# Patient Record
Sex: Male | Born: 1965 | Race: White | Hispanic: No | Marital: Single | Smoking: Current every day smoker
Health system: Southern US, Community
[De-identification: ages and names within clinical notes are randomized; demographics above are authoritative.]

## PROBLEM LIST (undated history)

## (undated) DIAGNOSIS — I509 Heart failure, unspecified: Secondary | ICD-10-CM

## (undated) DIAGNOSIS — Z972 Presence of dental prosthetic device (complete) (partial): Secondary | ICD-10-CM

## (undated) DIAGNOSIS — E7800 Pure hypercholesterolemia, unspecified: Secondary | ICD-10-CM

## (undated) DIAGNOSIS — J449 Chronic obstructive pulmonary disease, unspecified: Secondary | ICD-10-CM

## (undated) DIAGNOSIS — K812 Acute cholecystitis with chronic cholecystitis: Secondary | ICD-10-CM

## (undated) DIAGNOSIS — K429 Umbilical hernia without obstruction or gangrene: Secondary | ICD-10-CM

## (undated) DIAGNOSIS — G709 Myoneural disorder, unspecified: Secondary | ICD-10-CM

## (undated) DIAGNOSIS — F32A Depression, unspecified: Secondary | ICD-10-CM

## (undated) DIAGNOSIS — F419 Anxiety disorder, unspecified: Secondary | ICD-10-CM

## (undated) DIAGNOSIS — I10 Essential (primary) hypertension: Secondary | ICD-10-CM

## (undated) DIAGNOSIS — R0600 Dyspnea, unspecified: Secondary | ICD-10-CM

## (undated) DIAGNOSIS — I499 Cardiac arrhythmia, unspecified: Secondary | ICD-10-CM

## (undated) DIAGNOSIS — F1010 Alcohol abuse, uncomplicated: Secondary | ICD-10-CM

## (undated) DIAGNOSIS — R569 Unspecified convulsions: Secondary | ICD-10-CM

## (undated) DIAGNOSIS — F329 Major depressive disorder, single episode, unspecified: Secondary | ICD-10-CM

## (undated) DIAGNOSIS — I639 Cerebral infarction, unspecified: Secondary | ICD-10-CM

## (undated) HISTORY — DX: Acute cholecystitis with chronic cholecystitis: K81.2

## (undated) HISTORY — DX: Umbilical hernia without obstruction or gangrene: K42.9

---
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
## 2009-07-25 ENCOUNTER — Inpatient Hospital Stay: Payer: Self-pay | Admitting: Internal Medicine

## 2009-08-25 ENCOUNTER — Inpatient Hospital Stay: Payer: Self-pay | Admitting: Internal Medicine

## 2010-05-25 ENCOUNTER — Emergency Department: Payer: Self-pay | Admitting: Emergency Medicine

## 2012-06-14 ENCOUNTER — Inpatient Hospital Stay: Payer: Self-pay | Admitting: Internal Medicine

## 2012-06-14 LAB — COMPREHENSIVE METABOLIC PANEL
Albumin: 3.6 g/dL (ref 3.4–5.0)
Anion Gap: 10 (ref 7–16)
BUN: 7 mg/dL (ref 7–18)
Bilirubin,Total: 0.3 mg/dL (ref 0.2–1.0)
Chloride: 105 mmol/L (ref 98–107)
Co2: 23 mmol/L (ref 21–32)
Creatinine: 0.83 mg/dL (ref 0.60–1.30)
EGFR (African American): >60
Osmolality: 276 (ref 275–301)
Potassium: 3.6 mmol/L (ref 3.5–5.1)
Sodium: 138 mmol/L (ref 136–145)
Total Protein: 7.3 g/dL (ref 6.4–8.2)

## 2012-06-14 LAB — CK TOTAL AND CKMB (NOT AT ARMC)
CK, Total: 200 U/L (ref 35–232)
CK-MB: 3.3 ng/mL (ref 0.5–3.6)

## 2012-06-14 LAB — CBC
HGB: 15.6 g/dL (ref 13.0–18.0)
MCH: 33.2 pg (ref 26.0–34.0)
MCV: 96 fL (ref 80–100)
WBC: 7.4 10*3/uL (ref 3.8–10.6)

## 2012-06-14 LAB — TROPONIN I: Troponin-I: <0.02 ng/mL

## 2012-06-15 LAB — CBC WITH DIFFERENTIAL/PLATELET
Basophil #: 0 10*3/uL (ref 0.0–0.1)
Basophil %: 0.3 %
Eosinophil #: 0 10*3/uL (ref 0.0–0.7)
Eosinophil %: 0.2 %
HCT: 46.3 % (ref 40.0–52.0)
HGB: 16 g/dL (ref 13.0–18.0)
Lymphocyte #: 0.4 10*3/uL — ABNORMAL LOW (ref 1.0–3.6)
MCH: 33.4 pg (ref 26.0–34.0)
MCHC: 34.6 g/dL (ref 32.0–36.0)
MCV: 96 fL (ref 80–100)
Monocyte #: 0.1 x10 3/mm — ABNORMAL LOW (ref 0.2–1.0)
Neutrophil #: 4.5 10*3/uL (ref 1.4–6.5)
Platelet: 171 10*3/uL (ref 150–440)
RBC: 4.8 10*6/uL (ref 4.40–5.90)
WBC: 5.1 10*3/uL (ref 3.8–10.6)

## 2012-06-15 LAB — BASIC METABOLIC PANEL
Calcium, Total: 8.7 mg/dL (ref 8.5–10.1)
Co2: 27 mmol/L (ref 21–32)
EGFR (African American): >60
EGFR (Non-African Amer.): >60
Osmolality: 276 (ref 275–301)
Potassium: 3.8 mmol/L (ref 3.5–5.1)
Sodium: 137 mmol/L (ref 136–145)

## 2012-06-15 LAB — HEMOGLOBIN A1C: Hemoglobin A1C: 5.1 % (ref 4.2–6.3)

## 2012-06-20 ENCOUNTER — Emergency Department: Payer: Self-pay | Admitting: Emergency Medicine

## 2012-06-20 LAB — BASIC METABOLIC PANEL
BUN: 9 mg/dL (ref 7–18)
Co2: 26 mmol/L (ref 21–32)
Creatinine: 0.88 mg/dL (ref 0.60–1.30)
EGFR (Non-African Amer.): >60
Glucose: 111 mg/dL — ABNORMAL HIGH (ref 65–99)
Potassium: 3.6 mmol/L (ref 3.5–5.1)
Sodium: 138 mmol/L (ref 136–145)

## 2012-06-20 LAB — CBC
MCH: 33.7 pg (ref 26.0–34.0)
MCV: 95 fL (ref 80–100)
Platelet: 239 10*3/uL (ref 150–440)
RBC: 4.38 10*6/uL — ABNORMAL LOW (ref 4.40–5.90)

## 2013-03-26 LAB — CBC
HCT: 47.1 % (ref 40.0–52.0)
HGB: 16.5 g/dL (ref 13.0–18.0)
MCV: 98 fL (ref 80–100)
Platelet: 103 10*3/uL — ABNORMAL LOW (ref 150–440)
WBC: 5.8 10*3/uL (ref 3.8–10.6)

## 2013-03-26 LAB — BASIC METABOLIC PANEL
Anion Gap: 9 (ref 7–16)
BUN: 5 mg/dL — ABNORMAL LOW (ref 7–18)
Calcium, Total: 8.8 mg/dL (ref 8.5–10.1)
Chloride: 107 mmol/L (ref 98–107)
Co2: 24 mmol/L (ref 21–32)
Creatinine: 0.67 mg/dL (ref 0.60–1.30)
EGFR (Non-African Amer.): >60
Potassium: 3.5 mmol/L (ref 3.5–5.1)
Sodium: 140 mmol/L (ref 136–145)

## 2013-03-26 LAB — TROPONIN I: Troponin-I: <0.02 ng/mL

## 2013-03-27 ENCOUNTER — Inpatient Hospital Stay: Payer: Self-pay | Admitting: Internal Medicine

## 2013-03-27 LAB — DRUG SCREEN, URINE
Amphetamines, Ur Screen: NEGATIVE (ref ?–1000)
Barbiturates, Ur Screen: NEGATIVE (ref ?–200)
Cocaine Metabolite,Ur ~~LOC~~: NEGATIVE (ref ?–300)
Methadone, Ur Screen: NEGATIVE (ref ?–300)
Opiate, Ur Screen: NEGATIVE (ref ?–300)
Phencyclidine (PCP) Ur S: NEGATIVE (ref ?–25)
Tricyclic, Ur Screen: NEGATIVE (ref ?–1000)

## 2013-03-27 LAB — ETHANOL
Ethanol %: 0.401 % (ref 0.000–0.080)
Ethanol: 401 mg/dL

## 2013-05-24 ENCOUNTER — Ambulatory Visit: Payer: Self-pay | Admitting: Gastroenterology

## 2013-06-21 ENCOUNTER — Inpatient Hospital Stay: Payer: Self-pay | Admitting: Internal Medicine

## 2013-06-21 LAB — BASIC METABOLIC PANEL
Anion Gap: 6 — ABNORMAL LOW (ref 7–16)
BUN: 5 mg/dL — ABNORMAL LOW (ref 7–18)
Calcium, Total: 9.4 mg/dL (ref 8.5–10.1)
Co2: 30 mmol/L (ref 21–32)
EGFR (African American): >60
EGFR (Non-African Amer.): >60
Potassium: 3.6 mmol/L (ref 3.5–5.1)
Sodium: 139 mmol/L (ref 136–145)

## 2013-06-21 LAB — CBC
HCT: 47.6 % (ref 40.0–52.0)
HGB: 16.4 g/dL (ref 13.0–18.0)
MCH: 33.4 pg (ref 26.0–34.0)
MCHC: 34.4 g/dL (ref 32.0–36.0)
RBC: 4.9 10*6/uL (ref 4.40–5.90)

## 2013-06-22 LAB — CBC WITH DIFFERENTIAL/PLATELET
Basophil #: 0.1 10*3/uL (ref 0.0–0.1)
Basophil %: 0.3 %
Eosinophil #: 0 10*3/uL (ref 0.0–0.7)
Eosinophil %: 0 %
HCT: 47.7 % (ref 40.0–52.0)
HGB: 16.4 g/dL (ref 13.0–18.0)
Lymphocyte #: 0.7 10*3/uL — ABNORMAL LOW (ref 1.0–3.6)
Lymphocyte %: 4.3 %
MCV: 97 fL (ref 80–100)
Monocyte #: 0.3 x10 3/mm (ref 0.2–1.0)
Monocyte %: 2 %
Neutrophil %: 93.4 %
Platelet: 192 10*3/uL (ref 150–440)
RBC: 4.94 10*6/uL (ref 4.40–5.90)
RDW: 12.2 % (ref 11.5–14.5)
WBC: 15.9 10*3/uL — ABNORMAL HIGH (ref 3.8–10.6)

## 2013-06-22 LAB — BASIC METABOLIC PANEL
Anion Gap: 7 (ref 7–16)
BUN: 9 mg/dL (ref 7–18)
Calcium, Total: 9.6 mg/dL (ref 8.5–10.1)
Co2: 25 mmol/L (ref 21–32)
Creatinine: 0.73 mg/dL (ref 0.60–1.30)
EGFR (Non-African Amer.): >60
Glucose: 131 mg/dL — ABNORMAL HIGH (ref 65–99)
Osmolality: 272 (ref 275–301)
Potassium: 3.6 mmol/L (ref 3.5–5.1)

## 2013-10-03 ENCOUNTER — Inpatient Hospital Stay: Payer: Self-pay | Admitting: Internal Medicine

## 2013-10-03 LAB — BASIC METABOLIC PANEL
ANION GAP: 8 (ref 7–16)
BUN: 4 mg/dL — AB (ref 7–18)
CALCIUM: 8.7 mg/dL (ref 8.5–10.1)
Chloride: 103 mmol/L (ref 98–107)
Co2: 27 mmol/L (ref 21–32)
Creatinine: 0.76 mg/dL (ref 0.60–1.30)
EGFR (African American): >60
EGFR (Non-African Amer.): >60
Glucose: 114 mg/dL — ABNORMAL HIGH (ref 65–99)
Osmolality: 273 (ref 275–301)
POTASSIUM: 3.5 mmol/L (ref 3.5–5.1)
SODIUM: 138 mmol/L (ref 136–145)

## 2013-10-03 LAB — CBC
HCT: 47.9 % (ref 40.0–52.0)
HGB: 16.8 g/dL (ref 13.0–18.0)
MCH: 33.9 pg (ref 26.0–34.0)
MCHC: 34.9 g/dL (ref 32.0–36.0)
MCV: 97 fL (ref 80–100)
Platelet: 154 10*3/uL (ref 150–440)
RBC: 4.94 10*6/uL (ref 4.40–5.90)
RDW: 12.6 % (ref 11.5–14.5)
WBC: 4.3 10*3/uL (ref 3.8–10.6)

## 2013-10-03 LAB — TROPONIN I: Troponin-I: <0.02 ng/mL

## 2013-11-21 LAB — BASIC METABOLIC PANEL
Anion Gap: 7 (ref 7–16)
BUN: 7 mg/dL (ref 7–18)
CALCIUM: 9.5 mg/dL (ref 8.5–10.1)
CHLORIDE: 103 mmol/L (ref 98–107)
Co2: 26 mmol/L (ref 21–32)
Creatinine: 0.78 mg/dL (ref 0.60–1.30)
EGFR (African American): >60
GLUCOSE: 98 mg/dL (ref 65–99)
OSMOLALITY: 270 (ref 275–301)
Potassium: 3.8 mmol/L (ref 3.5–5.1)
Sodium: 136 mmol/L (ref 136–145)

## 2013-11-21 LAB — CBC
HCT: 48.2 % (ref 40.0–52.0)
HGB: 16.6 g/dL (ref 13.0–18.0)
MCH: 33.5 pg (ref 26.0–34.0)
MCHC: 34.6 g/dL (ref 32.0–36.0)
MCV: 97 fL (ref 80–100)
Platelet: 130 10*3/uL — ABNORMAL LOW (ref 150–440)
RBC: 4.96 10*6/uL (ref 4.40–5.90)
RDW: 12.7 % (ref 11.5–14.5)
WBC: 9 10*3/uL (ref 3.8–10.6)

## 2013-11-21 LAB — PRO B NATRIURETIC PEPTIDE: B-Type Natriuretic Peptide: 27 pg/mL (ref 0–125)

## 2013-11-21 LAB — TROPONIN I: Troponin-I: <0.02 ng/mL

## 2013-11-22 ENCOUNTER — Inpatient Hospital Stay: Payer: Self-pay | Admitting: Internal Medicine

## 2013-11-23 LAB — CBC WITH DIFFERENTIAL/PLATELET
Basophil #: 0 10*3/uL (ref 0.0–0.1)
Basophil %: 0.3 %
EOS ABS: 0 10*3/uL (ref 0.0–0.7)
EOS PCT: 0 %
HCT: 45.7 % (ref 40.0–52.0)
HGB: 15.7 g/dL (ref 13.0–18.0)
LYMPHS ABS: 0.4 10*3/uL — AB (ref 1.0–3.6)
LYMPHS PCT: 3.6 %
MCH: 33.4 pg (ref 26.0–34.0)
MCHC: 34.3 g/dL (ref 32.0–36.0)
MCV: 97 fL (ref 80–100)
MONO ABS: 0.3 x10 3/mm (ref 0.2–1.0)
Monocyte %: 2.7 %
Neutrophil #: 10.5 10*3/uL — ABNORMAL HIGH (ref 1.4–6.5)
Neutrophil %: 93.4 %
Platelet: 136 10*3/uL — ABNORMAL LOW (ref 150–440)
RBC: 4.71 10*6/uL (ref 4.40–5.90)
RDW: 12.9 % (ref 11.5–14.5)
WBC: 11.3 10*3/uL — AB (ref 3.8–10.6)

## 2013-11-23 LAB — BASIC METABOLIC PANEL
Anion Gap: 7 (ref 7–16)
BUN: 10 mg/dL (ref 7–18)
CO2: 25 mmol/L (ref 21–32)
Calcium, Total: 9.1 mg/dL (ref 8.5–10.1)
Chloride: 102 mmol/L (ref 98–107)
Creatinine: 0.79 mg/dL (ref 0.60–1.30)
EGFR (Non-African Amer.): >60
Glucose: 168 mg/dL — ABNORMAL HIGH (ref 65–99)
OSMOLALITY: 271 (ref 275–301)
Potassium: 3.7 mmol/L (ref 3.5–5.1)
Sodium: 134 mmol/L — ABNORMAL LOW (ref 136–145)

## 2013-11-23 LAB — MAGNESIUM: Magnesium: 2.1 mg/dL

## 2014-04-24 ENCOUNTER — Inpatient Hospital Stay: Payer: Self-pay | Admitting: Internal Medicine

## 2014-04-24 LAB — URINALYSIS, COMPLETE
Bacteria: NONE SEEN
Bilirubin,UR: NEGATIVE
Blood: NEGATIVE
Glucose,UR: NEGATIVE mg/dL (ref 0–75)
Leukocyte Esterase: NEGATIVE
Nitrite: NEGATIVE
Ph: 6 (ref 4.5–8.0)
Protein: 100
RBC,UR: 1 /HPF (ref 0–5)
SPECIFIC GRAVITY: 1.018 (ref 1.003–1.030)
Squamous Epithelial: <1
WBC UR: 4 /HPF (ref 0–5)

## 2014-04-24 LAB — DRUG SCREEN, URINE
Amphetamines, Ur Screen: NEGATIVE (ref ?–1000)
Barbiturates, Ur Screen: NEGATIVE (ref ?–200)
Benzodiazepine, Ur Scrn: NEGATIVE (ref ?–200)
Cannabinoid 50 Ng, Ur ~~LOC~~: POSITIVE (ref ?–50)
Cocaine Metabolite,Ur ~~LOC~~: NEGATIVE (ref ?–300)
MDMA (Ecstasy)Ur Screen: NEGATIVE (ref ?–500)
METHADONE, UR SCREEN: NEGATIVE (ref ?–300)
Opiate, Ur Screen: NEGATIVE (ref ?–300)
PHENCYCLIDINE (PCP) UR S: NEGATIVE (ref ?–25)
Tricyclic, Ur Screen: NEGATIVE (ref ?–1000)

## 2014-04-24 LAB — BASIC METABOLIC PANEL
Anion Gap: 10 (ref 7–16)
BUN: 12 mg/dL (ref 7–18)
CHLORIDE: 99 mmol/L (ref 98–107)
CREATININE: 0.9 mg/dL (ref 0.60–1.30)
Calcium, Total: 8.2 mg/dL — ABNORMAL LOW (ref 8.5–10.1)
Co2: 25 mmol/L (ref 21–32)
EGFR (African American): >60
EGFR (Non-African Amer.): >60
Glucose: 133 mg/dL — ABNORMAL HIGH (ref 65–99)
Osmolality: 270 (ref 275–301)
Potassium: 3.8 mmol/L (ref 3.5–5.1)
Sodium: 134 mmol/L — ABNORMAL LOW (ref 136–145)

## 2014-04-24 LAB — ETHANOL: Ethanol: <3 mg/dL

## 2014-04-24 LAB — CBC
HCT: 47.2 % (ref 40.0–52.0)
HGB: 15.8 g/dL (ref 13.0–18.0)
MCH: 32.8 pg (ref 26.0–34.0)
MCHC: 33.5 g/dL (ref 32.0–36.0)
MCV: 98 fL (ref 80–100)
Platelet: 91 10*3/uL — ABNORMAL LOW (ref 150–440)
RBC: 4.83 10*6/uL (ref 4.40–5.90)
RDW: 12.2 % (ref 11.5–14.5)
WBC: 4.7 10*3/uL (ref 3.8–10.6)

## 2014-04-24 LAB — SALICYLATE LEVEL: Salicylates, Serum: 3.3 mg/dL — ABNORMAL HIGH

## 2014-04-24 LAB — COMPREHENSIVE METABOLIC PANEL
ALK PHOS: 141 U/L — AB
Albumin: 4.2 g/dL (ref 3.4–5.0)
Anion Gap: 14 (ref 7–16)
BUN: 11 mg/dL (ref 7–18)
Bilirubin,Total: 2.7 mg/dL — ABNORMAL HIGH (ref 0.2–1.0)
CHLORIDE: 95 mmol/L — AB (ref 98–107)
CO2: 22 mmol/L (ref 21–32)
Calcium, Total: 9.2 mg/dL (ref 8.5–10.1)
Creatinine: 0.55 mg/dL — ABNORMAL LOW (ref 0.60–1.30)
EGFR (African American): >60
GLUCOSE: 82 mg/dL (ref 65–99)
OSMOLALITY: 261 (ref 275–301)
Potassium: 5.9 mmol/L — ABNORMAL HIGH (ref 3.5–5.1)
SGOT(AST): 261 U/L — ABNORMAL HIGH (ref 15–37)
SGPT (ALT): 239 U/L — ABNORMAL HIGH
SODIUM: 131 mmol/L — AB (ref 136–145)
Total Protein: 8.6 g/dL — ABNORMAL HIGH (ref 6.4–8.2)

## 2014-04-24 LAB — ACETAMINOPHEN LEVEL: Acetaminophen: <2 ug/mL

## 2014-04-25 LAB — CBC WITH DIFFERENTIAL/PLATELET
Basophil #: 0 10*3/uL (ref 0.0–0.1)
Basophil %: 1 %
Eosinophil #: 0.2 10*3/uL (ref 0.0–0.7)
Eosinophil %: 6 %
HCT: 41.1 % (ref 40.0–52.0)
HGB: 14.4 g/dL (ref 13.0–18.0)
LYMPHS PCT: 30.8 %
Lymphocyte #: 0.9 10*3/uL — ABNORMAL LOW (ref 1.0–3.6)
MCH: 34.3 pg — AB (ref 26.0–34.0)
MCHC: 34.9 g/dL (ref 32.0–36.0)
MCV: 98 fL (ref 80–100)
Monocyte #: 0.4 x10 3/mm (ref 0.2–1.0)
Monocyte %: 14.5 %
Neutrophil #: 1.4 10*3/uL (ref 1.4–6.5)
Neutrophil %: 47.7 %
Platelet: 64 10*3/uL — ABNORMAL LOW (ref 150–440)
RBC: 4.19 10*6/uL — ABNORMAL LOW (ref 4.40–5.90)
RDW: 12.2 % (ref 11.5–14.5)
WBC: 2.9 10*3/uL — ABNORMAL LOW (ref 3.8–10.6)

## 2014-04-25 LAB — COMPREHENSIVE METABOLIC PANEL
ALBUMIN: 3.2 g/dL — AB (ref 3.4–5.0)
ALT: 179 U/L — AB
ANION GAP: 7 (ref 7–16)
AST: 195 U/L — AB (ref 15–37)
Alkaline Phosphatase: 111 U/L
BUN: 10 mg/dL (ref 7–18)
Bilirubin,Total: 2.2 mg/dL — ABNORMAL HIGH (ref 0.2–1.0)
CALCIUM: 8.2 mg/dL — AB (ref 8.5–10.1)
CREATININE: 0.86 mg/dL (ref 0.60–1.30)
Chloride: 100 mmol/L (ref 98–107)
Co2: 29 mmol/L (ref 21–32)
GLUCOSE: 92 mg/dL (ref 65–99)
Osmolality: 271 (ref 275–301)
Potassium: 3.8 mmol/L (ref 3.5–5.1)
SODIUM: 136 mmol/L (ref 136–145)
Total Protein: 6.4 g/dL (ref 6.4–8.2)

## 2014-04-26 LAB — COMPREHENSIVE METABOLIC PANEL
ANION GAP: 9 (ref 7–16)
AST: 146 U/L — AB (ref 15–37)
Albumin: 3 g/dL — ABNORMAL LOW (ref 3.4–5.0)
Alkaline Phosphatase: 118 U/L — ABNORMAL HIGH
BUN: 8 mg/dL (ref 7–18)
Bilirubin,Total: 1.7 mg/dL — ABNORMAL HIGH (ref 0.2–1.0)
CO2: 25 mmol/L (ref 21–32)
Calcium, Total: 8.3 mg/dL — ABNORMAL LOW (ref 8.5–10.1)
Chloride: 103 mmol/L (ref 98–107)
Creatinine: 0.79 mg/dL (ref 0.60–1.30)
EGFR (Non-African Amer.): >60
Glucose: 84 mg/dL (ref 65–99)
OSMOLALITY: 271 (ref 275–301)
POTASSIUM: 3.2 mmol/L — AB (ref 3.5–5.1)
SGPT (ALT): 175 U/L — ABNORMAL HIGH
Sodium: 137 mmol/L (ref 136–145)
TOTAL PROTEIN: 6.1 g/dL — AB (ref 6.4–8.2)

## 2014-05-31 ENCOUNTER — Inpatient Hospital Stay: Payer: Self-pay | Admitting: Internal Medicine

## 2014-05-31 LAB — CBC
HCT: 44.1 % (ref 40.0–52.0)
HGB: 15.3 g/dL (ref 13.0–18.0)
MCH: 34.2 pg — AB (ref 26.0–34.0)
MCHC: 34.6 g/dL (ref 32.0–36.0)
MCV: 99 fL (ref 80–100)
Platelet: 185 10*3/uL (ref 150–440)
RBC: 4.47 10*6/uL (ref 4.40–5.90)
RDW: 12.6 % (ref 11.5–14.5)
WBC: 5.3 10*3/uL (ref 3.8–10.6)

## 2014-05-31 LAB — COMPREHENSIVE METABOLIC PANEL
ALBUMIN: 3.7 g/dL (ref 3.4–5.0)
ALT: 185 U/L — AB
Alkaline Phosphatase: 107 U/L
Anion Gap: 8 (ref 7–16)
BUN: 7 mg/dL (ref 7–18)
Bilirubin,Total: 0.9 mg/dL (ref 0.2–1.0)
CALCIUM: 8.5 mg/dL (ref 8.5–10.1)
CHLORIDE: 101 mmol/L (ref 98–107)
CREATININE: 0.79 mg/dL (ref 0.60–1.30)
Co2: 27 mmol/L (ref 21–32)
EGFR (African American): >60
EGFR (Non-African Amer.): >60
Glucose: 107 mg/dL — ABNORMAL HIGH (ref 65–99)
OSMOLALITY: 270 (ref 275–301)
POTASSIUM: 3.9 mmol/L (ref 3.5–5.1)
SGOT(AST): 130 U/L — ABNORMAL HIGH (ref 15–37)
Sodium: 136 mmol/L (ref 136–145)
Total Protein: 7.7 g/dL (ref 6.4–8.2)

## 2014-05-31 LAB — PRO B NATRIURETIC PEPTIDE: B-Type Natriuretic Peptide: 101 pg/mL (ref 0–125)

## 2014-05-31 LAB — TROPONIN I
Troponin-I: <0.02 ng/mL
Troponin-I: <0.02 ng/mL

## 2014-05-31 LAB — CK-MB
CK-MB: <0.5 ng/mL — ABNORMAL LOW (ref 0.5–3.6)
CK-MB: 0.6 ng/mL (ref 0.5–3.6)

## 2014-06-01 LAB — BASIC METABOLIC PANEL WITH GFR
Anion Gap: 7
BUN: 11 mg/dL
Calcium, Total: 8.5 mg/dL
Chloride: 101 mmol/L
Co2: 28 mmol/L
Creatinine: 0.8 mg/dL
EGFR (African American): >60
EGFR (Non-African Amer.): >60
Glucose: 140 mg/dL — ABNORMAL HIGH
Osmolality: 274
Potassium: 4.1 mmol/L
Sodium: 136 mmol/L

## 2014-06-01 LAB — CBC WITH DIFFERENTIAL/PLATELET
Basophil #: 0 x10 3/mm 3
Basophil %: 0.2 %
Eosinophil #: 0 x10 3/mm 3
Eosinophil %: 0 %
HCT: 42.7 %
HGB: 14.6 g/dL
Lymphocyte %: 7.5 %
Lymphs Abs: 0.6 x10 3/mm 3 — ABNORMAL LOW
MCH: 34.2 pg — ABNORMAL HIGH
MCHC: 34.3 g/dL
MCV: 100 fL
Monocyte #: 0.6 "x10 3/mm "
Monocyte %: 7.1 %
Neutrophil #: 6.9 x10 3/mm 3 — ABNORMAL HIGH
Neutrophil %: 85.2 %
Platelet: 196 x10 3/mm 3
RBC: 4.28 x10 6/mm 3 — ABNORMAL LOW
RDW: 12.2 %
WBC: 8.1 x10 3/mm 3

## 2014-06-02 LAB — EXPECTORATED SPUTUM ASSESSMENT W GRAM STAIN, RFLX TO RESP C

## 2014-06-04 LAB — HEPATIC FUNCTION PANEL A (ARMC)
ALBUMIN: 2.6 g/dL — AB (ref 3.4–5.0)
Alkaline Phosphatase: 93 U/L
Bilirubin,Total: 0.4 mg/dL (ref 0.2–1.0)
SGOT(AST): 176 U/L — ABNORMAL HIGH (ref 15–37)
SGPT (ALT): 227 U/L — ABNORMAL HIGH
Total Protein: 5.8 g/dL — ABNORMAL LOW (ref 6.4–8.2)

## 2014-09-05 ENCOUNTER — Emergency Department: Payer: Self-pay | Admitting: Emergency Medicine

## 2014-09-25 ENCOUNTER — Emergency Department: Payer: Self-pay | Admitting: Emergency Medicine

## 2014-10-29 NOTE — Discharge Summary (Signed)
PATIENT NAME:  George Arellano, George Arellano MR#:  [id number] DATE OF BIRTH:  11/18/1965  DATE OF ADMISSION:  06/14/2012 DATE OF DISCHARGE:  06/16/2012  ADMITTING DIAGNOSIS: Chronic obstructive pulmonary disease exacerbation.   DISCHARGE DIAGNOSES: 1. Chronic obstructive pulmonary disease exacerbation. 2. Acute bronchitis. 3. Hypoxia, resolved.  4. Elevated transaminases likely due to alcohol use.  5. Alcohol and tobacco abuse.  6. Hyperglycemia with hemoglobin A1c 5.1.    DISCHARGE CONDITION: Stable.   DISCHARGE MEDICATIONS: Patient is to continue: 1. Albuterol CFC free inhalation aerosol 2 puffs 4 times daily as needed.  2. Albuterol ipratropium 2.5/0.5 mL inhalation solution inhaled four times daily. 3. Budesonide formoterol 160/4.5 inhalation aerosol 2 twice daily. 4. Tiotropium 1 capsule once daily. 5. Guaifenesin 600 mg p.o. twice daily.  6. Erythromycin 500 mg p.o. once daily for four more days.  7. Magnesium oxide 400 mg p.o. daily.  8. Nicotine transdermal film 21 mg topically once daily. 9. Thiamine 100 mg p.o. daily.  10. Prednisone 60 mg p.o. once a day for two days, then taper x 10 mg every two days until stop.   HOME OXYGEN: None.   DIET: Regular, regular consistency.   ACTIVITY LIMITATIONS: As tolerated.   FOLLOW UP: Follow-up appointment with [facility] on 07/19/2011 to establish primary care physician.   CONSULTANTS: None except of care management.    HISTORY OF PRESENT ILLNESS: Patient is a 49 year old Caucasian male with past medical history significant for history of tobacco abuse who presented to the hospital with complaints of significant shortness of breath, wheezing and cough. Please refer to Dr. James Ivanoff' admission note on 06/14/2012. On arrival to the hospital patient's blood pressure was 116/67, pulse was 112, respiration rate 26, temperature was 98 and oxygen saturations were 90% on room air. Physical exam showed wheezing and rhonchi but no dullness to  percussion and significant use of accessory muscles, especially whenever he talked otherwise physical examination was unremarkable.  LABORATORY, DIAGNOSTIC AND RADIOLOGICAL DATA: Chest x-ray PA and lateral 06/14/2012 revealed findings consistent with chronic obstructive pulmonary disease and likely superimposed acute bronchitis. I cannot exclude minimal subsegmental atelectasis in the perihilar regions according to radiologist.   Patient's lab data revealed elevated glucose level to 132, otherwise BMP was unremarkable. Patient's liver enzymes revealed mild elevation of AST to 57, otherwise unremarkable. First set of cardiac enzymes was normal. CBC was within normal limits. Patient's EKG showed sinus tachy at 111 beats per minute, normal axis. No acute ST-T changes were noted. Patient's chest x-ray, as mentioned above, was unremarkable except of acute bronchitis as well as chronic obstructive pulmonary disease were suspected.   HOSPITAL COURSE: Patient was admitted to the hospital for chronic obstructive pulmonary disease exacerbation, acute bronchitis, was started on steroids IV as well as inhalation therapy around the clock and antibiotic therapy with Rocephin and Zithromax. Over the next few days he improved significantly. By 06/16/2012 he felt satisfactory and was about to go home. He wanted to go home. His physical exam showed improvement of his wheezing as well as air entrance improved. It was felt to be safe to return back home today and continue his therapy with steroids, tapering dose of steroids as well as antibiotics and inhalation therapy. He was prescribed inhalers as well as steroids. He is to follow up with [facility] in next few days after discharge. His oxygenation also improved and patient did not require oxygen and was able to ambulate with no significant discomfort or shortness of breath on  room air. His vital signs on day of discharge: Temperature 97.9, pulse ranging from 100s to 105,  respiratory rate 18, blood pressure 120 to 140s, oxygen saturation was 97% to 98% on room air at rest as well as on exertion.   In regards to tobacco abuse, patient was counseled extensively and was prescribed nicotine replacement therapy. He was agreeable to try to quit smoking.  In regards to elevated transaminases, we did not recheck his transaminases but it was felt to be due to alcohol abuse. Patient was counseled about alcohol use.  For hyperglycemia, patient's hemoglobin A1c was checked, was found to be 5.1 and was felt to be stress related as well as possibly steroid related.   Patient is being discharged in stable condition with above-mentioned medications and follow up.   TIME SPENT: 40 minutes.   ____________________________ Theodoro Grist, MD rv:cms D: 06/17/2012 12:24:05 ET T: 06/17/2012 14:13:26 ET JOB#: [id number]  cc: Theodoro Grist, MD, <Dictator> [facility] [place] MD ELECTRONICALLY SIGNED 06/24/2012 16:15

## 2014-10-29 NOTE — H&P (Signed)
PATIENT NAME:  George Arellano, George Arellano MR#:  [id number] DATE OF BIRTH:  [birth year]  DATE OF ADMISSION:  06/14/2012  REFERRING PHYSICIAN: Dr. Loletta Specter  PRIMARY CARE PHYSICIAN: [facility]    HISTORY OF PRESENT ILLNESS: George Arellano is a nice 49 year old gentleman who has history of significant tobacco abuse and ongoing tobacco. He was diagnosed with chronic obstructive pulmonary disease about five years ago. He is overall taking his treatment whenever he can but apparently his albuterol nebs and inhaler has expired. Apparently he went to his cousin four days ago and he spent the night there. He had a kerosene heater and apparently ran out of kerosene for what the night was really cold. In the morning the patient woke up having shortness of breath, wheezing, and sore throat.and dysphonia and yesterday he was helping some friends to move some things like furniture inside the house and started getting really, really short of breath. He denies any fever. He denies any changes in his sputum. He has a lot of cough and his sputum is clear, pretty much white foamy color. He states that he could not breathe very well and he is actually not moving much air. Whenever he is here in the ER his oxygen saturation was low in the 90% and was using accessory muscles and having significant discomfort breathing. He has received multiple nebulizer treatments and his condition has not improved.   REVIEW OF SYSTEMS: CONSTITUTIONAL: Denies any fever, fatigue, weakness, weight loss or weight gain. EYES: Denies any blurry vision or changes in his eyesight. ENT: No tinnitus. No difficulty swallowing. Sore throat has relieved. No epistaxis. RESPIRATORY: No previous pneumonia but he states that he had significant cough, lots of wheezing. No hemoptysis. Positive dyspnea and feels tight whenever he needs to take a really deep breath. CARDIOVASCULAR: No chest pain, orthopnea, edema, arrhythmias, or palpitations. GASTROINTESTINAL: No nausea,  vomiting. No changes in his stool patterns. No hematochezia or melena. GENITOURINARY: No dysuria or hematuria. No kidney stones. ENDOCRINOLOGY: No polyuria, polydipsia, polyphagia. No thyroid problems, heat or cold intolerance. HEMATOLOGIC/LYMPHATIC: No anemia, easy bruising or bleeding. SKIN: No acne or rashes. MUSCULOSKELETAL: No significant joint swelling or gout. NEUROLOGIC: No numbness, tingling. No vertigo. No transient ischemic attacks. PSYCHIATRIC: No anxiety or depression at this moment.   PAST MEDICAL HISTORY:  1. Chronic obstructive pulmonary disease diagnosed five years ago.  2. Ongoing tobacco abuse.  3. History of alcohol abuse.   ALLERGIES: No known drug allergies.    PAST SURGICAL HISTORY: Left hand tendon repair in 2002.   FAMILY HISTORY: Negative for myocardial infarction. Positive for Alzheimer's in both the grandparents. Positive for leukemia in one of his uncles.   MEDICATIONS: Albuterol nebs and inhaler.   SOCIAL HISTORY: Patient smokes 1 pack per day. He says that he has been smoking for 40 years. He is only 49 so when I asked him again he said probably whenever he was past 11 so at least 30 to 35 years. Alcohol: He drinks a sixpack a day. He is unemployed, lives with his mother and he is divorced. His kids are grown.   CODE STATUS: Patient wants to be a DO NOT RESUSCITATE.   PHYSICAL EXAMINATION:  VITAL SIGNS: Blood pressure 116/67, pulse 112, respirations 26, temperature 98, room oxygen saturation 90%.   GENERAL: Patient is alert, oriented x3. There is moderate respiratory distress with use of accessory muscles. Patient is sitting on bed grabbing the rails and taking deep breaths.   HEENT: His pupils are equal  and reactive. Extraocular movements intact. Mucosa are dry. No oropharyngeal exudates. Anicteric sclerae. Pink conjunctivae.   NECK: Supple. No JVD. No thyromegaly. No adenopathy. No masses. No carotid bruits.   CARDIOVASCULAR: Regular rate and rhythm,  slightly tachycardic. No rubs or gallops. No displacement of PMI. No tenderness to palpation of the chest.   LUNGS: Patient has significant wheezing and some rhonchi. There is no dullness to percussion and there is significant use of accessory muscles especially when he talks.   ABDOMEN: Soft, nontender, nondistended. No hepatosplenomegaly. No masses. Bowel sounds are positive.   GENITAL: Deferred.   EXTREMITIES: No edema, no cyanosis, no clubbing. Pulses +2.   SKIN: Without any rashes or petechiae. Multiple tattoos on his body.   NEUROLOGIC: Cranial nerves II through XII are intact. Strength is 5/5 in four extremities.   PSYCH: Mood is normal without any signs of anxiety or depression.   MUSCULOSKELETAL: No significant joint deformity or joint effusions.   LYMPHATIC: Negative for lymphadenopathy in neck or supraclavicular area.   LABORATORY, DIAGNOSTIC AND RADIOLOGICAL DATA: Glucose 132, sodium 138, potassium 3.6, AST 57, troponin 0.2, white count 7.4, hemoglobin 15, platelets 168. Chest x-ray did not show any significant consolidations or suspicious infiltrates. EKG slightly tachycardic but sinus rhythm. No ST depression or elevation.   ASSESSMENT AND PLAN: 49 year old gentleman with history of ongoing tobacco abuse, chronic obstructive pulmonary disease who comes with chronic obstructive pulmonary disease exacerbation.  1. Acute respiratory failure. Patient hypoxemic with significant use of accessory muscles. Patient put on oxygen and neb treatments and steroids.  2. Chronic obstructive pulmonary disease. We are going to treat this is an exacerbation with Solu-Medrol 60 mg every six hours IV. He will require a slow taper. He is significantly wheezing and has not had any improvement with his nebs. Will continue nebs every four hours scheduled and continue pumonary toilety. The patient is going to be covered with antibiotics since this is such a severe exacerbation. He denies any fever or  changes in his phlegm but he has been covered with Rocephin and azithromycin. Consider stopping these medications if no fever and if there is rapid improvement with steroids. Patient will need his inhalers to be prescribed through [facility] and I am going to start him on steroids inhaled as well. Patient needs to be on Spiriva.  3. Tobacco. I gave him a five minute talk about tobacco and tobacco counseling. The patient states he is going to quit or he is going to try his best at least. Nicotine patches have been prescribed.  4. Elevated glucose. Follow up labs in the morning. If continues elevated check hemoglobin A1c.  5. Elevated AST due to alcohol abuse.  6. Significant alcohol abuse. We are going to put him on CIWA protocol with oral lorazepam and thiamine and multivitamins, folic acid.  7. Patient is a DO NOT RESUSCITATE.   TIME SPENT: I spent 45 minutes with this admission.   ____________________________ [place] Sink, MD rsg:cms D: 06/14/2012 19:36:57 ET T: 06/15/2012 05:19:04 ET JOB#: [id number]  cc: [place] Sink, MD, <Dictator> [facility] [place] MD ELECTRONICALLY SIGNED 06/16/2012 13:46

## 2014-11-01 NOTE — H&P (Signed)
PATIENT NAME:  George Arellano, George Arellano MR#:  [id number] DATE OF BIRTH:  26-May-1966  DATE OF ADMISSION:  06/21/2013  PRIMARY CARE PHYSICIAN: [facility].   CHIEF COMPLAINT: Shortness of breath and wheezing.   HISTORY OF PRESENT ILLNESS: The patient is a 49 year old male with a past medical history of COPD, asthma, tobacco abuse and alcohol abuse. He is presenting to the ER with a chief complaint of a two day history of shortness of breath associated with cough. The patient is reporting that he started having baseline shortness of breath from the year 2010, but he was severely short of breath for the past two days. He was wheezing, could not breath,  and has been having productive cough. He is bringing up clear phlegm. Denies any hematemesis or blood in his phlegm. Denies any chest pain, dizziness or loss of consciousness. No sick contacts. Still smokes 1 pack a day and planning to quit, but he is not successful, seeking some assistance. The patient checked his pulse oximetry using his mom's pulse oximetry and he was is satting 66% on room air. He called EMS and as per EMS, he was sating 70% on room air. The patient  was placed and nonrebreather.  He was given 62.5 mg of Solu-Medrol IV and nebulizer treatments and he was brought into the ER. Chest x-ray revealed no acute findings. The patient was given DuoNeb treatments and still he was satting 84% on room air and he is placed on 4 liters of oxygen, following which he was sating 97%. Hospitalist team is called to admit the patient. During my examination, the patient started feeling much better. Shortness of breath is significantly improved, but still wheezing. No family members are at bedside. Denies any fevers. No other complaints.   PAST MEDICAL HISTORY: COPD,  asthma, alcohol dependence tobacco abuse.   PAST SURGICAL HISTORY: Left wrist surgery after he was injured with a glass piece.   PSYCHOSOCIAL HISTORY: Lives at home. Drinks 6 pack of beer per day one.  Smokes 1 pack a day. Denies any street drugs.   FAMILY HISTORY: Alzheimer's disease in a grandpa.  Uncle has leukemia.   ALLERGIES: LEVAQUIN.   HOME MEDICATIONS:  Proventil 2 puffs inhalation 4 times a day as needed for shortness of breath. Prednisone 10 mg 4 tablets once daily, Norvasc 10 mg once daily, multivitamin 1 tablet p.o. once daily. Albuterol 3 mL inhalation as needed for shortness of breath.   REVIEW OF SYSTEMS: CONSTITUTIONAL: Denies any fever or fatigue.  EYES: Denies blurry vision, double vision.  ENT: Denies epistaxis, discharge, runny nose.  RESPIRATORY: Complaining of productive cough with clear phlegm, chronic COPD, chronic asthma.  CARDIOVASCULAR: Denies chest pain, palpitations.  GASTROINTESTINAL: Denies nausea, vomiting, diarrhea.  GENITOURINARY: Denies any dysuria, hematuria. Denies any hernia repairs in the past. ENDOCRINE: Denies polyuria, nocturia, hyperthyroidism,  diabetes mellitus. HEMATOLOGIC AND LYMPHATIC: No anemia, easy bruising, bleeding.  INTEGUMENTARY: No acne, rash, lesions.  MUSCULOSKELETAL: No joint pain in the neck and back. Denies any arthritis.  NEUROLOGIC: Denies vertigo, ataxia, stroke or TIA.  PSYCHIATRIC: No ADD, OCD. Denies any history of depression.   PHYSICAL EXAMINATION: VITAL SIGNS: Temperature 97.7, pulse 93, respirations 18, blood pressure 144/98, pulse ox 96% on 3 L .  GENERAL APPEARANCE: Not in acute distress. Moderately built and nourished.  HEENT: Normocephalic, atraumatic. Pupils are equally reacting to light and accommodation. No scleral icterus. No conjunctival injection. Extraocular movements are intact. No sinus tenderness. Nares are patent. No congestion.  NECK: Supple.  No JVD. No thyromegaly. Range of motion is intact.  LUNGS: Diffuse wheezing is present. No accessory muscle use. There is no crackles. No anterior chest wall tenderness on palpation.  CARDIAC: S1, S2 normal. Regular rate and rhythm. No murmurs.   GASTROINTESTINAL: Soft. Bowel sounds are positive in all four quadrants. Nontender, nondistended. No hepatosplenomegaly. No masses felt.  NEUROLOGIC: Awake, alert, oriented x 3. Motor and sensory grossly intact. Cranial nerves II through XII are grossly intact. Reflexes are 2+.  EXTREMITIES: No edema. No cyanosis. No clubbing.  SKIN: Warm to touch. Normal turgor. No rashes. No lesions.   MUSCULOSKELETAL: No joint effusion, tenderness, erythema. Reflexes are 2+.  PSYCHIATRIC: Normal mood and affect.   LABORATORY AND IMAGING STUDIES: Chest x-ray, bilateral bronchial thickening Lungs otherwise are clear. Troponin less than 0.02. CBC normal. BMP is also normal, except anion gap which is low at 6.   ASSESSMENT AND PLAN: A 48 year old male presenting to the ER with a chief complaint of shortness of breath associated with coughing and wheezing for two days, will be admitted with the following assessment and plan.  1. Acute respiratory distress with hypoxia and cough secondary to acute exacerbation of chronic obstructive pulmonary disease: We will admit him to med/surg unit with telemetry. We will provide him IV Solu-Medrol.  2. DuoNeb treatments q. 4-6h, Albuterol q.4 hours p.r.n. for wheezing, shortness of breath.  3. Provide IV Rocephin and Zithromax.  4. Nicotine dependence. Counseled the patient to quit smoking. We will provide  nicotine patch.  5. Hypertension. Blood pressure is stable. Continue amlodipine.  6. Alcohol dependence. Provided counseling. The patient will be on CIWA protocol. He will be benefited  with the outpatient rehab.  7. We will provide gastrointestinal and deep vein thrombosis prophylaxis.   CODE STATUS: HE IS FULL CODE.   Mother is medical power of attorney.   Diagnosis and plan of care was discussed in detail with the patient. He is aware of the plan. Total time spent on admission is 45 minutes.  ____________________________ Nicholes Mango, MD ag:sg D: 06/21/2013  06:50:56 ET T: 06/21/2013 07:23:56 ET JOB#: [id number]  cc: Nicholes Mango, MD, <Dictator> Nicholes Mango MD ELECTRONICALLY SIGNED 06/26/2013 0:04

## 2014-11-01 NOTE — H&P (Signed)
PATIENT NAME:  George Arellano, OLIVERA MR#:  [id number] DATE OF BIRTH:  1966/07/09  DATE OF ADMISSION:  03/26/2013  PRIMARY CARE PHYSICIAN: [facility].   REFERRING PHYSICIAN: [place], Physician Assistant, [place]   HISTORY OF PRESENT ILLNESS: Mr. Berenson is a 49 year old Caucasian gentleman with past medical history of COPD, tobacco abuse and alcohol abuse, is presenting with shortness of breath. Apparently, he was outside earlier today on the day of admission when he noticed it was difficult to catch his breath. Unfortunately, he did not have any p.r.n. inhalers with him so he called EMS. He states that he has a chronic cough which is nonproductive in nature, which is unchanged. He denies any fevers, chills or recent sick contacts. On arrival to the Emergency Department, he is found to be hypoxemic at 88% on room air, requiring supplemental O2 to maintain saturations greater than 92%. Currently, he denies any symptoms. He says that his breathing has improved after breathing treatments.    REVIEW OF SYSTEMS:  CONSTITUTIONAL: Denies fevers, chills, fatigue.  EYES: Denies blurred vision or pain.  ENT: Denies ear pain, discharge or dysphagia.  RESPIRATORY: Cough as above. Denies any wheezing. Shortness of breath as above.  CARDIOVASCULAR: Denies chest pain, palpitations.  GASTROINTESTINAL: Denies nausea, vomiting, diarrhea, abdominal pain.  GENITOURINARY: Denies dysuria or hematuria.  ENDOCRINE: Denies nocturia or thyroid problems.  HEMATOLOGIC AND LYMPHATIC: Denies easy bruising or bleeding.  SKIN: Denies any rashes or lesions.  MUSCULOSKELETAL: Denies pain in the neck, back, shoulder. Denies arthritis.  NEUROLOGIC: Denies paralysis or paresthesias.  PSYCHIATRIC: Denies anxiety, depression.   Otherwise, full review of systems performed by me is negative.   PAST MEDICAL HISTORY: COPD non-O2 dependent, tobacco abuse, alcohol abuse.   FAMILY HISTORY: Alzheimer's in his grandparents as well as leukemia  in an uncle. Denies any lung or heart disease.   SOCIAL HISTORY: Current everyday alcohol abuser, drinking 6 beers daily. Last drink right before arrival to the Emergency Department. States that he has a 40-pack-year smoking history, smoking between 1 to 2 packs daily. Denies drug usage.   ALLERGIES: LEVAQUIN.   HOME MEDICATIONS: Include ProAir 90 mcg inhalation 2 puffs as needed 4 times daily for shortness of breath, Symbicort 160/4.5 mcg inhalation 2 times b.i.d., Spiriva 18 mcg inhalation once daily.   PHYSICAL EXAMINATION:  VITAL SIGNS: Temperature 98.2, heart rate 88, respirations 22, blood pressure 135/74, saturating 96% on 2 liters nasal cannula. Original O2 saturation 88% on room air. Weight is 77.1 kg. BMI 25.9.  GENERAL: Sleeping, easy arousable, in no acute distress, awake, alert, oriented x3.  HEENT: Normocephalic, atraumatic. Pupils equal, round, reactive to light as well as accommodation. Extraocular muscles intact. He has conjunctival injection and erythema. No nasal lesions or discharge. Ears: No drainage or lesions. Mouth: Moist mucosal membranes.  NECK: Supple and symmetric. No JVD.  CARDIOVASCULAR: S1, S2, regular rate and rhythm. No murmurs, rubs or gallops.  PULMONARY: Scant wheezing located over the right upper lobe with diffuse rhonchi. Good air entry bilaterally.  ABDOMEN: Soft, nontender, nondistended. Positive bowel sounds. No hepatosplenomegaly.  EXTREMITIES: No cyanosis, edema or clubbing. He has full range of motion.  SKIN: Inspection within normal limits. Well hydrated. No wounds.  LYMPHATIC: No lymphadenopathy.  NEUROLOGIC: Cranial nerves II through XII intact. No gross focal neurological deficits.  PSYCHIATRIC: Judgment and insight within normal limits.   LABORATORY DATA: Sodium 140, potassium 3.5, chloride 107, bicarb 24, BUN 5, creatinine 0.67, glucose 88. Troponin I less than 0.02. WBC 5.8, hemoglobin  16.5, platelets 103. EKG: Normal sinus rhythm. T inversions  in lateral lead which is new from his previous EKG in December 2013. Chest x-ray: Flattened diaphragms, although no frank infiltrate.   ASSESSMENT AND PLAN: A 49 year old gentleman with history of chronic obstructive pulmonary disease, tobacco abuse, alcohol abuse who is presenting with shortness of breath, found to be hypoxemic at 88%, requiring supplemental oxygen to maintain saturations on arrival to the Emergency Department.  1. Chronic obstructive pulmonary disease exacerbation: He was given Decadron by the Emergency Department as well as DuoNeb treatments. He now states that he is feeling better but continues to have persistent rhonchi with scant wheezing. Will provide supplemental oxygen to keep oxygen saturation greater than 92%. DuoNeb therapy q.4 hours while awake. Incentive spirometry. Continue home dose of the Symbicort and Spiriva. Also continue steroids.  2. Alcohol abuse: Last drink prior to arrival to the Emergency Department. Give thiamine and folate. Caution for withdrawal. Check urine drug screen.  3. EKG changes: He denies any symptoms. Will trend cardiac enzymes x3 and recheck EKG tomorrow.   The patient is FULL CODE.   TIME SPENT: 33 minutes.   ____________________________ Aaron Mose. Hower, MD dkh:gb D: 03/26/2013 23:46:00 ET T: 03/27/2013 00:25:32 ET JOB#: [id number]  cc: Aaron Mose. Hower, MD, <Dictator> [facility]ID [place]fin [place] MD ELECTRONICALLY SIGNED 03/27/2013 0:53

## 2014-11-01 NOTE — Discharge Summary (Signed)
PATIENT NAME:  George Arellano, George Arellano MR#:  [id number] DATE OF BIRTH:  [birth year]  DATE OF ADMISSION:  03/27/2013 DATE OF DISCHARGE:  03/28/2013  PRIMARY CARE PHYSICIAN:  Nonlocal.  DISCHARGE DIAGNOSES: 1.  Chronic obstructive pulmonary disease exacerbation. 2.  Accelerated hypertension.  3.  Tobacco abuse.  4.  Alcohol abuse.   CONDITION: Stable.   CODE STATUS: FULL CODE.   HOME MEDICATIONS:   1.  Spiriva 18 mcg one cap once a day. 2.  Proventil cfc  90 mcg inhalation aerosol 2 puffs 4 times a day p.r.n. for shortness of breath.  3.  Albuterol 2.5 mg/3 mL  inhalation solution 3 mL inhaled once a day.  4.  Prednisone 10 mg tablets 4 tablets once a day for 2 days then taper.  5.  Nicotine patch 14 mg/24 hour transdermal film, 1 patch transdermal once a day.  6.  Multivitamin 1 tablet once a day.  7.  Folic acid 1 mg p.o. daily.  8.  Thiamine 100 mg p.o. daily for 7 days.  9.  Norvasc 10 mg p.o. once a day   DIET: Low sodium diet.   ACTIVITY: As tolerated.   FOLLOW-UP CARE: Follow with PCP within 1 to 2 weeks   REASON FOR ADMISSION: Shortness of breath and cough.    HOSPITAL COURSE: The patient is a 49 year old Caucasian male with a history of COPD, tobacco abuse, alcohol abuse, presented to the ED with shortness of breath and cough. The patient denies any fever or chills, but O2 saturation decreased to 88% on room air. The patient was treated with oxygen and admitted for COPD exacerbation. For detailed history and physical examination, please refer to the admission note dictated by Dr. Lavetta Nielsen.   1.  COPD exacerbation. After admission, the patient has been treated with oxygen, Symbicort, Spiriva and DuoNeb. In addition, the patient was treated with a Solu-Medrol IVPB. The patient's symptoms have much improved today. Physical examination showed only mild wheezing.  2.  For alcohol abuse, the patient has been placed on CIWA protocol with folic acid and thiamine.  The patient has a chronic  tremor, but no agitation, anxiety or confusion.  3.  Accelerated hypertension. The patient's blood pressure was high yesterday afternoon and this morning.  Blood pressure increased to 186/108 early this morning. The patient was treated with  Norvasc and blood pressure decreased to 138/86.  4.  The patient's symptoms have much improved. He is clinically stable. He will be discharged to home today. I discussed the patient's discharge plan with the patient, case manager and nurse.   TIME SPENT: About 35 minutes.   ____________________________ Demetrios Loll, MD qc:dp D: 03/28/2013 14:16:36 ET T: 03/28/2013 14:51:36 ET JOB#: [id number]  cc: Demetrios Loll, MD, <Dictator> Demetrios Loll MD ELECTRONICALLY SIGNED 03/28/2013 15:57

## 2014-11-02 NOTE — H&P (Signed)
PATIENT NAME:  George Arellano, George Arellano MR#:  [id number] DATE OF BIRTH:  06-11-1966  DATE OF ADMISSION:  04/24/2014  CHIEF COMPLAINT: Vomiting, tremors, not feeling well.   HISTORY OF PRESENT ILLNESS: This 49 year old Caucasian male patient with history of hypertension, COPD, tobacco abuse, alcohol abuse presents to the Emergency Room after he started throwing up earlier today. He has also had tremors, not feeling well, and presents to the Emergency Room asking for detoxification. The patient here in the Emergency Room was given Ativan along with fluids with increasing tremors, blood pressure elevated up to 180/120, significantly tachycardic, and in withdrawal, for which he is being admitted to the hospitalist service. He also has transaminitis and also hyperkalemia.   The patient mentions that he tried taking his blood pressure pills earlier today but was unable to. He drinks about 12 beers a day. Yesterday he had only 4 beers. Presently wants to get detoxed as he is fed up with feeling bad over the alcohol.   PAST MEDICAL HISTORY: 1.  Hypertension.  2.  COPD.  3.  Tobacco abuse.  4.  Alcohol abuse of a 12-pack of beer a day.   ALLERGIES: LEVAQUIN.   SOCIAL HISTORY: The patient smokes 1 pack a day, uses marijuana regularly. Heavy alcohol use. No IV drug use. Lives with his mom. Ambulates on his own. He is on disability.   FAMILY HISTORY: Diabetes, hypertension.   HOME MEDICATIONS: Lisinopril 2.5 mg daily, clonidine 0.1 mg oral 2 times a day,  Proventil every 4 hours as needed, Combivent as needed. Spiriva daily.   REVIEW OF SYSTEMS:  CONSTITUTIONAL: Complains of fatigue, weight loss.  EYES: No blurry vision, pain, or redness.  ENT: No tinnitus, ear pain, hearing loss.  RESPIRATORY: No cough, wheeze, hemoptysis.  CARDIOVASCULAR: No chest pain, orthopnea, edema.  GASTROINTESTINAL: Has nausea and vomiting. No abdominal pain.  GENITOURINARY: No dysuria, hematuria or frequency.  ENDOCRINE: No  polyuria, nocturia, thyroid problems.  HEMATOLOGIC AND LYMPHATIC: No anemia, easy bruising, bleeding.  INTEGUMENTARY: No acne, rash, lesion.  MUSCULOSKELETAL: No back pain, arthritis.  NEUROLOGIC: No focal numbness, weakness, seizure.  PSYCHIATRIC: Has anxiety.   PHYSICAL EXAMINATION: VITAL SIGNS: Temperature 98.1, pulse of 117, blood pressure 175/120, saturating 94% on room air.  GENERAL: Obese, Caucasian male patient sitting up in bed, anxious. Has visible tremors.  HEENT: Atraumatic, normocephalic. Oral mucosa dry and pink. External ears are normal. No pallor. Has icterus. Pupils are equal and reactive to light.  NECK: Supple. No thyromegaly. No palpable lymph nodes. Trachea midline. No carotid bruits or JVD.  CARDIOVASCULAR: S1, S2, tachycardic without any murmurs. Peripheral pulses 2+. No edema.  RESPIRATORY: Normal work of breathing. Clear to auscultation on both sides.  GASTROINTESTINAL: Soft abdomen, nontender. Bowel signs present. No organomegaly palpable.  SKIN: Warm and dry. No petechiae, rash, ulcers.  MUSCULOSKELETAL: No joint swelling, redness, effusion of the large joints. Normal muscle tone.  NEUROLOGICAL: Motor strength 5/5 in upper and lower extremities. Sensation is intact all over.  LYMPHATIC: No cervical lymphadenopathy.   LABORATORY STUDIES: Show glucose 82, BUN 11, creatinine 0.55, sodium 131, potassium 5.9, chloride 95, GFR greater than 60. AST and ALT of 261 and 239, alkaline phosphatase of 151, bilirubin 2.7.   Urine drug screen is positive for marijuana.   WBC 4.7, hemoglobin 15.8, platelets of 91,000.  Urinalysis shows no bacteria.   Serum salicylate level at 3.3, which is therapeutic level.   ASSESSMENT AND PLAN: 1.  Alcohol withdrawal in a patient who is interested  in detoxification. At this point, he is having vomiting, unable to keep any medications down. We will put him on IV Ativan and Haldol p.r.n. along with admit onto telemetry floor for alcohol  withdrawal. He does have tachycardia, significant tremors, anxiety, and elevated blood pressure with the withdrawals, and had some hallucinations yesterday at home. Depending on patient's progress, may need to consult psychiatry for inpatient detox at a later date.  2.  Accelerated hypertension. The patient is unable to keep his medications down, also has withdrawals. He did get a dose of clonidine in the Emergency Room. Will put him on labetalol p.r.n., continue his clonidine, discontinue the lisinopril secondary to hyperkalemia, and add metoprolol b.i.d.   3.  Hyperkalemia secondary to dehydration and being on lisinopril. Discontinue lisinopril, put him on aggressive IV fluid resuscitation, repeat potassium levels in 4 hours. We will get an EKG to look for any EKG changes.  4.  Tobacco abuse. The patient has been counseled to quit smoking for greater than 3 minutes.  5.  Hyponatremia secondary to dehydration and alcohol.  6.  Deep vein thrombosis prophylaxis with sequential compression devices. No heparin or Lovenox secondary to thrombocytopenia secondary to alcohol.   CODE STATUS: Full code.   TIME SPENT ON THIS CASE: 50 minutes.     ____________________________ Leia Alf Eulalia Ellerman, MD srs:at D: 04/24/2014 16:09:57 ET T: 04/24/2014 17:14:45 ET JOB#: [id number]  cc: Alveta Heimlich R. Rhiley Tarver, MD, <Dictator> Neita Carp MD ELECTRONICALLY SIGNED 05/08/2014 10:44

## 2014-11-02 NOTE — Discharge Summary (Signed)
PATIENT NAME:  George Arellano, George Arellano MR#:  [id number] DATE OF BIRTH:  07/21/1965  DATE OF ADMISSION:  10/03/2013 DATE OF DISCHARGE:  10/05/2013  DISCHARGE DIAGNOSES:  Acute on chronic hypoxic respiratory failure, likely due to chronic obstructive pulmonary disease exacerbation, now at baseline.   SECONDARY DIAGNOSES: 1.  Chronic obstructive pulmonary disease.  2.  Hypertension.  3.  Ongoing tobacco abuse.   CONSULTATIONS: None.   PROCEDURES AND RADIOLOGY: Chest x-ray on the 25th of March showed no acute cardiopulmonary disease.   HISTORY AND SHORT HOSPITAL COURSE: The patient reports to me with above-mentioned medical problems, admitted for acute on chronic respiratory failure, likely due to COPD exacerbation. Please see Dr. Edward Jolly dictated history and physical for further details. The patient was slowly improving with nebulizer breathing treatment, Advair, Spiriva and steroid. He was close to his baseline on the 27th of March and is being discharged home in stable condition. On the date of discharge, his vital signs are as follows: Temperature 97.6, heart rate 95 per minute, respirations 18 per minute, blood pressure 123/81 mmHg, was saturating 97% on 2 liters oxygen via nasal cannula. He did desaturate to 88% on room air with exertion and will require oxygen at home.   PERTINENT PHYSICAL EXAMINATION:  On the date of discharge: CARDIOVASCULAR: S1, S2 normal. No murmurs, rubs or gallop.  LUNGS: Clear to auscultation bilaterally. No wheezing, rales, rhonchi or crepitation.  ABDOMEN: Soft, benign.  NEUROLOGIC: Nonfocal examination. All other physical examination at baseline.   DISCHARGE MEDICATIONS: 1.  Proventil 2 puffs inhaled 4 times a day as needed.  2.  Combivent 1 puff inhaled twice a day. 3.  Spiriva once daily.  4.  DuoNeb 3 mL inhaled 4 times a day as needed.  5.  Tylenol with Codeine 1 tablet p.o. every 4 to 6 hours as needed.  6.  Clonidine 0.1 mg p.o. b.i.d.  7.  Azithromycin 250 mg  p.o. daily for 5 more days.  8.  Advair 250/50, 1 puff inhaled twice a day. 9.  Prednisone 60 mg p.o. daily, taper by 10 mg daily until finished.   DISCHARGE DIET:  Low sodium.  DISCHARGE ACTIVITY: As tolerated.   DISCHARGE INSTRUCTIONS AND FOLLOWUP:  1.  The patient was instructed to use 2 liters of oxygen by nasal cannula continuous, especially using during any ambulation.  2.  He will need followup with his primary care physician, Dr. Garen Grams, in 1 to 2 weeks.  3.  He will need followup with pulmonary physician in his county in 2 to 4 weeks.   Total time spent discharging this patient was 55 minutes.     ____________________________ Lucina Mellow. Manuella Ghazi, MD vss:dmm D: 10/05/2013 15:49:13 ET T: 10/05/2013 21:38:22 ET JOB#: [id number]  cc: Maeson Lourenco S. Manuella Ghazi, MD, <Dictator> Estell Harpin, NP Lucina Mellow [facility] MD ELECTRONICALLY SIGNED 10/07/2013 21:02

## 2014-11-02 NOTE — Discharge Summary (Signed)
PATIENT NAME:  George Arellano, George Arellano MR#:  [id number] DATE OF BIRTH:  April 18, 1966  DATE OF ADMISSION:  04/24/2014 DATE OF DISCHARGE:  04/26/2014  DISCHARGE DIAGNOSES:  1.  Alcohol abuse and withdrawal.  2.  Dehydration.  3.  Vomiting, likely from gastritis.  4.  Mild alcoholic hepatitis,  4.  Accelerated hypertension.  5.  Hyperkalemia.  6.  Non-compliance.  7.  Tobacco abuse.  7.  Chronic obstructive pulmonary disease exacerbation.   DISCHARGE MEDICATIONS:  1.  Proventil HFA 2 puffs inhaled 4 times a day as needed for shortness of breath.  2.  Combivent 1 puff inhaled 2 times a day as needed.  3.  Clonidine 0.1 mg oral 2 times a day.  4.  Lisinopril 10 mg oral once a day.  5.  Librium 25 mg oral every 8 hours for 2 days.  6.  Metoprolol tartrate 50 mg oral 2 times a day.  7.  Ranitidine 150 mg 2 times a day.  8.  Prednisone 60 mg tapered over 6 days.  9.  Advair Diskus 250/50 one puff inhaled 2 times a day.   DISCHARGE INSTRUCTIONS: Low-sodium diet. Activity as tolerated. Follow up with primary care physician in 1-2 weeks. The patient has been counseled to quit alcohol and smoking. The patient has been given information for outpatient alcohol rehabilitation by the case manager.   ADMITTING HISTORY AND PHYSICAL: Please see detailed H and P dictated previously. In brief, a 49 year old male patient with history of alcohol abuse, hypertension, noncompliance, tobacco abuse, presented to the hospital complaining of vomiting and shaking. The patient was found to be in alcohol withdrawal, admitted to hospitalist service.   HOSPITAL COURSE:  1.  Alcohol withdrawal. The patient was placed on scheduled Librium along with Ativan p.r.n. with which he has improved well. By day of discharge patient is not needing any IV Ativan. His tremors have reduced and he will be discharged home after giving him information regarding outpatient alcohol rehabilitation. The patient will be placed on scheduled Librium for  another 2  days to prevent further withdrawal.  2.  Vomiting secondary to gastritis. This was secondary to his alcohol. This has resolved once the patient stopped drinking alcohol.  3.  Accelerated hypertension secondary to alcohol withdrawal and to noncompliance with his medications. The patient was restarted on his home medications with Toprol XL added to his regimen with which his blood pressure is much improved.  4.  COPD exacerbation, mild. Placed on prednisone, albuterol, added Advair and Spiriva to his home medications and counseled the patient to quit smoking.   Prior to discharge, the patient has no wheezing, no tremors, has ambulated well, lungs sound clear. S1, S2 heard.   Time spent on day of discharge in discharge activity: 40 minutes.   ____________________________ Leia Alf Therma Lasure, MD srs:lt D: 04/30/2014 12:48:32 ET T: 04/30/2014 14:23:51 ET JOB#: [id number]  cc: Alveta Heimlich R. Nachum Derossett, MD, <Dictator> Neita Carp MD ELECTRONICALLY SIGNED 05/08/2014 10:45

## 2014-11-02 NOTE — Discharge Summary (Signed)
PATIENT NAME:  George Arellano, George Arellano MR#:  [id number] DATE OF BIRTH:  1965/09/19  DATE OF ADMISSION:  11/22/2013 DATE OF DISCHARGE:  11/24/2013  PRESENTING COMPLAINT: Shortness of breath.   DISCHARGE DIAGNOSES: 1. Acute on chronic obstructive pulmonary disease flare.  2. Hypertension.  3. Chronic smoking.  4. Alcohol abuse.   CODE STATUS: Full code.   MEDICATIONS: 1. DuoNeb 3 mL 4 times a day as needed.  2. Clonidine 0.1 mg b.i.d.  3. Proventil 90 mcg/inhalations 2 puffs 4 times a day as needed.  4. Lisinopril 2.5 mg b.i.d.  5. Combivent 1 puff b.i.d. as needed.  6. Spiriva 18 mcg inhalation 2 puffs daily.  7. Prednisone taper.  8. Budesonide formoterol 160/4.5, 2 puffs b.i.d.  9. NicoDerm CQ 14 mg patch once a day.  10. Doxycycline 100 mg b.i.d.  11. Continue your oxygen 2 liters nasal cannula.   DIET: Low sodium diet.   FOLLOWUP: With Dr Estell Harpin as outpatient.   BRIEF SUMMARY OF HOSPITAL COURSE: George Arellano is a 49 year old Caucasian gentleman with history of chronic obstructive pulmonary disease, chronic respiratory failure on home oxygen, comes in with:  1. Acute on chronic obstructive pulmonary disease exacerbation. He was started on IV Solu-Medrol changed to p.o. steroids, nebs were continued and the the patient got empirically doxycycline. The patient showed clinical improvement. He will finish up the course of steroids along with his antibiotics. He will continue using his home oxygen as before.  2. Hypertension, improved, continued clonidine and lisinopril.  3. Tobacco abuse. The patient was counseled on smoking cessation. Nicotine patch was given.  4. Alcohol abuse. CIWA protocol was continued. The patient did not show any signs and symptoms of withdrawal. Hospital stay otherwise remained stable.   CODE STATUS: The patient remained a full code.   TIME SPENT: 40 minutes.   ____________________________ Hart Rochester Posey Pronto, MD sap:sg D: 11/25/2013 07:08:15 ET T: 11/25/2013  09:41:01 ET JOB#: [id number]  cc: Zamarah Ullmer A. Posey Pronto, MD, <Dictator> Dr. Garen Grams  Ilda Basset MD ELECTRONICALLY SIGNED 12/02/2013 16:19

## 2014-11-02 NOTE — H&P (Signed)
PATIENT NAME:  George Arellano, George Arellano MR#:  [id number] DATE OF BIRTH:  03-02-1966  DATE OF ADMISSION:  10/03/2013  PRIMARY CARE PHYSICIAN: Dr. Garen Grams.   CHIEF COMPLAINT: Shortness of breath, cough and wheezing.   HISTORY OF PRESENT ILLNESS: This is a 49 year old male who presents to the hospital with shortness of breath, cough and wheezing, progressively getting worse in the past few days. The patient has underlying COPD and continues to smoke. He admits to a cough which is nonproductive. No fevers, no chills, no chest pain, no nausea, no vomiting. The patient attempted to use his nebulizer treatments at home. He took 2 treatments although it did not improve his symptoms. He continued to have significant wheezing and, therefore, he called EMS. Upon EMS arrival, the patient's oxygen saturation was noted to be in the low 80s to high 70s. He was rushed to the ER for further evaluation. In the Emergency Room, the patient received another nebulizer treatment but, despite that, he continued to be hypoxic with shortness of breath and hospitalist  services were contacted for further treatment and evaluation.   REVIEW OF SYSTEMS:  CONSTITUTIONAL: No documented fever. No weight gain or weight loss.  EYES: No blurred or double vision.  ENT: No tinnitus. No postnasal drip. No redness of the oropharynx.  RESPIRATORY: Positive cough. Positive wheeze. No hemoptysis. Positive dyspnea and COPD.  CARDIOVASCULAR: No chest pain, no orthopnea, no palpitations, no syncope.  GASTROINTESTINAL: No nausea, no vomiting, no diarrhea. No abdominal pain. No melena or hematochezia.  GENITOURINARY: No dysuria or hematuria.  ENDOCRINE: No polyuria or nocturia, no heat or cold intolerance.  HEMATOLOGIC: No anemia, no bruising, no bleeding.  INTEGUMENTARY: No rashes or lesions.  MUSCULOSKELETAL: No arthritis. No swelling. No gout.  NEUROLOGIC: No numbness or tingling. No ataxia. No seizure-type activity.  PSYCHIATRIC:  No anxiety, no  insomnia, no ADD.   PAST MEDICAL HISTORY: Consistent with COPD and hypertension and ongoing tobacco abuse.   ALLERGIES: LEVAQUIN, HE GETS MYALGIAS FROM THAT.   SOCIAL HISTORY: Does smoke about a pack per day, has smoked for the past 40+ years. Also drinks 2 40's daily. Occasional marijuana abuse. No other illicit drug abuse. Lives with his mother.   FAMILY HISTORY: Both mother and father are alive. Mother has diabetes and hypertension. Father has no medical problems.   CURRENT MEDICATIONS: Combivent 1 puff b.i.d., DuoNebs q.i.d. as needed, albuterol inhaler 2 puffs q.i.d. as needed, Spiriva 1 puff daily, Tylenol with codeine 1 tab q.4 to 6 hours as needed. Amoxicillin 500 mg q.6 hours.   PHYSICAL EXAMINATION: Presently is as follows:  VITAL SIGNS:  Noted to be temperature 98, pulse 106, respirations 16, blood pressure 185/104, sats 96% on 2 liters nasal cannula.  GENERAL: He is a pleasant-appearing male in mild respiratory distress.  HEAD, EYES, EARS, NOSE, THROAT: He is atraumatic, normocephalic. Extraocular muscles are intact. Pupils equal and reactive to light. Sclerae anicteric. No conjunctival injection. No pharyngeal erythema.  NECK: Supple. There is no jugular venous distention. No bruits, no lymphadenopathy, no thyromegaly.  HEART: Regular rate and rhythm. No murmurs, no rubs, no clicks.  LUNGS: He has coarse rhonchi and wheezing diffusely. Negative use of accessory muscles. No dullness to percussion.  ABDOMEN: Soft, flat, nontender, nondistended. Has good bowel sounds. No hepatosplenomegaly appreciated.  EXTREMITIES: No evidence of any cyanosis, clubbing or peripheral edema. Has posterior pedal and radial pulses bilaterally.  NEUROLOGICAL: The patient is alert, awake and oriented x 3 with no focal motor or sensory  deficits appreciated bilaterally.  SKIN: Moist and warm with no rashes appreciated.  LYMPHATIC: There is no cervical or axillary lymphadenopathy.   LABORATORY, DIAGNOSTIC  AND RADIOLOGICAL DATA:   1.  Shows serum glucose of 114, BUN 4, creatinine 0.7, sodium 138, potassium 3.5, chloride 103, bicarb 27. Troponin less than 0.02. White cell count 4.3, hemoglobin 16.6, hematocrit 47.9, platelet count 154.  2.  The patient did have a chest x-ray done which showed airway thickening suggesting bronchitis or reactive airway disease.   ASSESSMENT AND PLAN: This is a 49 year old male with a history of chronic obstructive pulmonary disease, hypertension, ongoing tobacco abuse, who presents to the hospital with shortness of breath, wheezing and cough and noted to be in chronic obstructive pulmonary disease exacerbation.  1.  Chronic obstructive pulmonary disease exacerbation. This is likely the cause of the patient's shortness of breath, wheezing and cough. This is likely due to underlying acute bronchitis and ongoing tobacco abuse. I will admit the patient, start him on IV steroids around-the-clock, also nebulizer treatments around-the-clock. Will start him on some Symbicort, continue Spiriva. Also give him some Pulmicort nebs, empirically start him on ceftriaxone and Zithromax. Follow sputum cultures.  2.  Hypertension. His blood pressure is significantly uncontrolled. This is likely due to medical noncompliance and also probably related to underlying respiratory distress. I will start him on some clonidine. Also place him on p.r.n. hydralazine and follow his hemodynamics.  3.  Tobacco abuse. Place him on a nicotine patch.  4.  Alcohol abuse. The patient drinks 2 40 oz beers daily. I will place him on clinical institute withdrawal assessment of alcohol protocol for now.  5.  CODE STATUS: The patient is a full code.   TIME SPENT: 50 minutes.    ____________________________ Belia Heman. Verdell Carmine, MD vjs:cs D: 10/03/2013 20:32:00 ET T: 10/03/2013 20:52:34 ET JOB#: [id number]  cc: Belia Heman. Verdell Carmine, MD, <Dictator> Henreitta Leber MD ELECTRONICALLY SIGNED 10/05/2013 20:32

## 2014-11-02 NOTE — H&P (Signed)
PATIENT NAME:  George Arellano, George Arellano MR#:  [id number] DATE OF BIRTH:  May 25, 1966  DATE OF ADMISSION:  05/31/2014  PRIMARY CARE PHYSICIAN: Unknown.  HISTORY OF PRESENT ILLNESS: The patient is a 49 year old Caucasian male with history of COPD, chronic respiratory failure, on 2 liters of oxygen through nasal cannula at night, who presented to the hospital with complaints of a three-day history of chest congestion with cough, wheezes, and significant shortness of breath. He had significant paroxysmal cough today and he became very short of breath. He has also noticed chills. He has been fatigue and weak for the past 3 days and admits of some weight loss. He decided to come to Emergency Room for further evaluation. In the Emergency Room, he was noted to be hypoxic with O2 sats of 97 on room air. He was initiated on oxygen therapy. Hospitalist services were contacted for admission since the patient is not improving on conventional therapy in the Emergency Room.  PAST MEDICAL HISTORY: Significant for history of admission for COPD exacerbation, alcohol withdrawal, nausea, vomiting, accelerated hypertension 14th through 16th of October 2015. Past medical history is also significant for chronic respiratory failure, 2 liters of oxygen through nasal cannula at night, COPD, hypertension, tobacco abuse, marijuana as well as alcohol abuse, SVT for which he was initiated on beta blockers.  ALLERGIES: LEVAQUIN.   SOCIAL HISTORY: Still smokes 1 pack a day, uses marijuana regularly. Heavy alcohol abuse. No IV drug abuse. Lives with his mom. Ambulates on his own. He is on disability.   FAMILY HISTORY: Diabetes as well as hypertension.  HOME MEDICATIONS: According to medical records, the patient is on clonidine 0.1 mg twice daily, Combivent CFC free 1 puff twice daily, furosemide 10 mg p.o. daily as needed, lisinopril 10 mg p.o. daily, metoprolol tartrate 50 mg p.o. twice daily, Proventil 2 puffs 4 times daily as needed, ranitidine  150 mg twice daily, Spiriva 2 puffs once daily, Symbicort 160/4.5 two puffs twice daily.  REVIEW OF SYSTEMS: Positive for 3 day history of a cough, fatigue and weakness, chills, phlegm production with clear thick phlegm, sinus congestion, postnasal drip, some weight loss, wheezes, shortness of breath, painful respirations, chest pain across the chest with breathing. Also palpitations. Apparently 3 weeks ago his heart rate went up to 170s. He was diagnosed with tachycardia for which he was initiated on beta blocker, metoprolol. Admits of having upper abdominal pains 2 days ago, which resolved now. Admits of ongoing tobacco abuse.  CONSTITUTIONAL: Denies any pains at present, weight gain.  EYES: Denies any double vision, glaucoma or cataracts.  EARS, NOSE, THROAT: Any tinnitus, allergies, epistaxis, sinus pain, dentures, difficulty swallowing.  RESPIRATORY: Denies hemoptysis. CARDIOVASCULAR: Denies orthopnea, edema, arrhythmias, syncope. GASTROINTESTINAL: Denies any nausea, vomiting, diarrhea, hematemesis, rectal bleeding, change in bowel habits.  GENITOURINARY: Denies dysuria, hematuria, frequency or incontinence.  ENDOCRINOLOGY: Denies any polydipsia, nocturia, thyroid problems, heat or cold intolerance or thirst.  HEMATOLOGIC: Denies anemia, easy bruising, bleeding or swollen glands.  SKIN: Denies any acne, rashes, lesions change in moles.  MUSCULOSKELETAL: Denies arthritis, cramps, swelling.  NEUROLOGIC: Denies numbness, epilepsy, or tremor. PSYCHIATRIC: Denies anxiety, insomnia, depression.   PHYSICAL EXAMINATION: VITAL SIGNS: On arrival to the hospital, temperature was 99.3, pulse 98, respirations 24, blood pressure 113/85, saturation 87 to 90% on room air.  GENERAL: This is a well-developed, well-nourished Caucasian male in no significant distress lying on the stretcher.  HEENT: His pupils were equal and reactive to light. Extraocular movements intact. No icterus or conjunctivitis. Has  normal hearing. No pharyngeal erythema. Mucosa is moist.  NECK: No masses. Supple and nontender. Thyroid is not enlarged. No adenopathy. No JVD or carotid bruits bilateral. Full range of motion.  LUNGS: Markedly diminished breath sounds bilaterally, rales as well as rhonchi, and expiratory as well as inspiratory wheezing. The patient does have labored inspirations as well as increased effort to breathe. He is in moderate respiratory distress.  CARDIOVASCULAR: S1, S2 appreciated. Rate and rhythm is regular. PMI not lateralized. Chest is nontender to palpation. 1+ pedal pulses.  EXTREMITIES: No lower extremity edema, calf tenderness or cyanosis was noted.  ABDOMEN: Soft, nontender. Bowel sounds are present. No hepatosplenomegaly or masses were noted.  RECTAL: Deferred.  MUSCLE STRENGTH: Able to move all extremities. No cyanosis, degenerative joint disease or kyphosis. Gait was not tested.  SKIN: Did not reveal any rashes, lesions, erythema, nodularity, or induration. It was warm and dry to palpation.  LYMPHATIC: No adenopathy in the cervical region.  NEUROLOGIC: Cranial nerves grossly intact. Sensory is intact. No dysarthria or aphasia.  PSYCH: The patient is alert, oriented to time, person and place, cooperative. Memory is somewhat impaired, but no significant confusion, agitation, or depression was noted.   DIAGNOSTIC DATA: The patient's EKG showed normal sinus rhythm at 100 beats per minute, normal axis. No acute ST-T changes were noted.   Glucose 107, otherwise unremarkable BMP. The patient's liver enzymes showed AST as well as ALT 130 and 185, respectively. The patient's troponin was less than 0.02. White blood cell count is normal at 5.3, hemoglobin 15.3, and platelet count 185,000.   Chest x-ray, PA and lateral, 20th of November 2015, showed normal exam.   ASSESSMENT AND PLAN: 1.  Acute on chronic respiratory failure with hypoxia. Admit the patient to the medical floor. Start him on  steroids, inhalers, as well as nebulizers and oxygen therapy keeping his pulse oximetry around 92% and above.  2.  Acute bronchitis. Continue the patient on Rocephin as well as Zithromax, which was already initiated in the Emergency Room. We will get sputum cultures.  3.  Chronic obstructive pulmonary disease exacerbation. As above, we will continue steroids, nebulizers, inhalers, and oxygen.  4.  Elevated transaminases, likely alcoholic hepatitis. Supportive therapy at this time. We will check if hepatitis panel was performed.  5.  Tobacco abuse. Discussed with the patient for approximately 5 minutes. Calculated lifetime tobacco expenditure of approximately 50,000. The patient was agreeable for tobacco replacement therapy.   TIME SPENT: 50 minutes.  ____________________________ Theodoro Grist, MD rv:sb D: 05/31/2014 14:19:11 ET T: 05/31/2014 14:49:32 ET JOB#: [id number]  cc: Theodoro Grist, MD, <Dictator> Ferrell Claiborne MD ELECTRONICALLY SIGNED 06/10/2014 21:14

## 2014-11-02 NOTE — H&P (Signed)
PATIENT NAME:  George Arellano, George Arellano MR#:  [id number] DATE OF BIRTH:  [birth year]  DATE OF ADMISSION:  11/22/2013  REFERRING PHYSICIAN:  Dr. Lurline Hare.  PRIMARY CARE PHYSICIAN:  Dr. Garen Grams.   CHIEF COMPLAINT:  Shortness of breath, cough, wheezing.   HISTORY OF PRESENT ILLNESS:  This is a 49 year old male known past medical history of COPD on home oxygen 2 liters nasal cannula at baseline, multiple admissions down the line due to COPD exacerbation, the patient is still smoking,  presents with complaints of shortness of breath and wheezing starting today, as well he reports some cough, nonproductive, reports has to increase his oxygen demand on home oxygen without much relief.  Denies any fever, any chills, any chest pain, any nausea, any vomiting, upon presentation the patient has significant wheezing which is much improved after he received nebulizer treatments and IV Solu-Medrol, chest x-ray does not show any opacity or any infiltrate.  The patient was afebrile without any significant leukocytosis, hospitalist requested to admit the patient for COPD exacerbation.   REVIEW OF SYSTEMS:  CONSTITUTIONAL:  Denies any fever, chills, fatigue, weakness.  EYES:  Denies blurry vision, double vision, inflammation. EARS, NOSE, THROAT:  Denies tinnitus, ear pain, hearing loss, epistaxis.  RESPIRATORY:  Reports cough, wheezing.  No hemoptysis.  Reports dyspnea and shortness of breath.  Denies any productive sputum.  CARDIOVASCULAR:  Denies chest pain, orthopnea, palpitations, syncope.  GASTROINTESTINAL:  Denies nausea, vomiting, diarrhea, abdominal pain, hematemesis.  GENITOURINARY:  Denies dysuria, hematuria, or renal colic.  ENDOCRINE:  Denies polyuria, polydipsia, heat or cold intolerance.  HEMATOLOGY:  Denies anemia, easy bruising, bleeding diathesis.  INTEGUMENT:  Denies acne, rash or skin lesion.  MUSCULOSKELETAL:  Denies any arthritis, swelling, gout.  NEUROLOGIC:  Denies CVA, TIA, seizures, numbness or  tingling.  PSYCHIATRIC:  Denies anxiety, insomnia, or depression.   PAST MEDICAL HISTORY: 1.  COPD.  2.  Chronic respiratory failure, on 2 liters nasal cannula.  3.  Ongoing tobacco abuse.  4.  Hypertension.   ALLERGIES:  LEVAQUIN.  HE GETS MYALGIAS FROM THAT.   SOCIAL HISTORY:  The patient still smokes 1 pack per day, occasional marijuana abuse, reports heavy alcohol abuse on a daily basis.  No IV drug abuse.   FAMILY HISTORY:  Significant for diabetes and hypertension.   HOME MEDICATIONS: 1.  Combivent as needed.  2.  Spiriva daily.  3.  Lisinopril 2.5 mg daily.  4.  clonidine 0.1 mg oral 2 times a day.  5.  Proventil as needed.   PHYSICAL EXAMINATION: VITAL SIGNS:  Temperature 99, pulse 114, respiratory rate 20, blood pressure 181/97, saturating 95 on oxygen 2 liters nasal.  GENERAL:  Well-nourished male looks comfortable in bed in no apparent distress.  HEENT:  Head atraumatic, normocephalic.  Pupils equal, reactive to light.  Pink conjunctivae.  Anicteric sclerae.  Moist oral mucosa.  NECK:  Supple.  No thyromegaly.  No JVD.  CHEST:  Had decreased air entry bilaterally with wheezing.  No rales or rhonchi.  CARDIOVASCULAR:  S1, S2 heard.  No rubs, murmurs, gallops, regular but tachycardic.  ABDOMEN:  Soft, nontender, nondistended.  Bowel sounds present.  EXTREMITIES:  No edema.  No clubbing.  No cyanosis.  Pedal and radial pulses +2 bilaterally.  PSYCHIATRIC:  Appropriate affect.  Awake, alert x 3.  Intact judgment and insight.  NEUROLOGIC:  Cranial nerves grossly intact.  Motor five out of five.  No focal deficits.  SKIN:  Normal skin turgor.  Warm and dry.  Has multiple tattoos.  MUSCULOSKELETAL:  No joint effusion or erythema.   PERTINENT LABORATORY DATA:  Glucose 98, BUN 7, creatinine 0.78, sodium 136, potassium 3.8, chloride 103, CO2 26.  Troponin less than 0.02.  White blood cells 9, hemoglobin 16.6, hematocrit 48.2, platelets 130.   IMAGING STUDIES:  Chest x-ray:  No  active cardiopulmonary disease.   ASSESSMENT AND PLAN: 1.  Chronic obstructive pulmonary disease exacerbation, the patient is known to have history of chronic obstructive pulmonary disease, presents with shortness of breath and significant wheezing.  He will be started on intravenous Solu-Medrol.  We will have him on proton pump inhibitor as well, we will have him on DuoNebs every four hours as needed albuterol, we will resume him back on his Spiriva, and we will add Advair for him, given the fact he does not have any productive sputum, we will hold on starting any intravenous antibiotics.  2.  Hypertension, uncontrolled.  We will resume the patient back on his home medication.  We will add as needed intravenous hydralazine.  3.  Tobacco abuse:  The patient was counseled.  4.  Alcohol abuse.  The patient will be started on CIWA protocol.  5.  CODE STATUS:  FULL CODE.  6.  Deep vein thrombosis prophylaxis.  SubQ heparin.    Total time spent on admission and patient care 50 minutes.     ____________________________ Albertine Patricia, MD dse:ea D: 11/22/2013 02:35:50 ET T: 11/22/2013 03:36:22 ET JOB#: [id number]  cc: Albertine Patricia, MD, <Dictator> DAWOOD Graciela Husbands MD ELECTRONICALLY SIGNED 11/23/2013 2:08

## 2014-11-02 NOTE — Discharge Summary (Signed)
PATIENT NAME:  George Arellano, George Arellano MR#:  [id number] DATE OF BIRTH:  February 15, 1966  DATE OF ADMISSION:  06/21/2013 DATE OF DISCHARGE:  06/23/2013  DISCHARGE DIAGNOSIS:  1.  Chronic obstructive pulmonary disease exacerbation. 2.  Tobacco abuse; the patient does not want to quit.   DISCHARGE MEDICATIONS: 1.  Spiriva 18 mcg 1 inhalation daily.  2.  Proventil HFA 90 mcg inhalation 4 times daily. 3.  Albuterol nebulizer 3 times daily.  4.  Combivent 100/20 mg one puff b.i.d.   prensione tapering   HISTORY AND HOSPITAL COURSE: The patient is an active tobacco abuser, and came in because of shortness of breath and wheezing, admitted for chronic obstructive pulmonary disease exacerbation. Chest x-ray and did not show any pneumonia. The patient's symptoms improved with IV Solu-Medrol. The patient's oxygen saturation saturations were above 90% on exertion and also at rest on room air. He did not qualify for home oxygen. Discharged with tapering course of prednisone, along with Augmentin.   TIME SPENT ON DISCHARGE: More than 30 minutes.     ____________________________ Epifanio Lesches, MD sk:cg D: 06/24/2013 22:08:29 ET T: 06/25/2013 00:44:25 ET JOB#: [id number]  cc: Epifanio Lesches, MD, <Dictator> Epifanio Lesches MD ELECTRONICALLY SIGNED 07/14/2013 17:32

## 2014-11-02 NOTE — Discharge Summary (Signed)
PATIENT NAME:  George Arellano, George Arellano MR#:  [id number] DATE OF BIRTH:  Aug 01, 1965  DATE OF ADMISSION:  05/31/2014 DATE OF DISCHARGE:  06/04/2014  DISCHARGE DIAGNOSES: 1.  Acute on chronic respiratory failure.  2.  Chronic obstructive pulmonary disease exacerbation.  3.  Hypertension.  4.  Tobacco abuse.  5.  Alcohol abuse with withdrawal.  6.  Alcohol hepatitis.  7.  Noncompliance.   DISCHARGE MEDICATIONS: Please refer to medical reconciliation.   DISCHARGE INSTRUCTIONS: Home oxygen 2 liters continuous per nasal cannula. Low-sodium diet, Activity as tolerated, quit alcohol and smoking. Follow up with alcohol rehab for which the patient has been given information.   IMAGING STUDIES: Include a chest x-ray which showed no acute abnormalities.   ADMITTING HISTORY AND PHYSICAL: Please see detailed H and P dictated by Dr. Ether Griffins. In brief, a 49 year old Caucasian male patient with history of alcohol, tobacco abuse, chronic respiratory failure, COPD on home oxygen presented to the hospital complaining of shortness of breath. Patient was found to have chronic obstructive pulmonary disease exacerbation, admitted to hospitalist service.   HOSPITAL COURSE: 1.  Acute on chronic respiratory failure secondary to COPD exacerbation. The patient was started on antibiotics, IV steroids, along with oxygen support and schedule nebulizer therapy, with which he has improved well. By the day of discharge, the patient still has expiratory wheezing, mild, but states that he feels back to baseline with his breathing, has requested to be discharged home. The patient is being switched to prednisone taper has been given prescriptions for his inhalers along with antibiotics, has home oxygen. He is being discharged home in a fair condition.  2.  Hypertension, well controlled during the hospital stay.  3.  Alcohol abuse and withdrawals. Patient was on scheduled Librium along with p.r.n. Ativan. He has been given Librium for 3 more  days after discharge. He has been given information regarding local rehab at which he will follow up.   4.  Tobacco abuse. The patient has been counseled to quit smoking for greater than 3 minutes during the hospital stay. His exacerbation and readmission for chronic obstructive pulmonary disease are secondary to ongoing tobacco abuse.   PHYSICAL EXAMINATION:  GENERAL: Prior to discharge, the patient is alert and oriented x 3.  HEART: S1, S2 heard without murmurs.  LUNGS: Have mild wheezing. Good air entry, normal work of breathing.  ABDOMEN: Soft, nontender.   TIME SPENT ON DAY OF DISCHARGE ON DISCHARGE ACTIVITY: Was 42 minutes.    ____________________________ Leia Alf Ravneet Spilker, MD srs:nt D: 06/05/2014 13:48:16 ET T: 06/05/2014 17:38:08 ET JOB#: [id number]  cc: Alveta Heimlich R. Akshaj Besancon, MD, <Dictator> Neita Carp MD ELECTRONICALLY SIGNED 06/10/2014 15:11

## 2015-05-05 ENCOUNTER — Observation Stay
Admission: EM | Admit: 2015-05-05 | Discharge: 2015-05-07 | Disposition: A | Discharge status: Home / Self Care | Payer: Medicaid Other | Attending: Internal Medicine | Admitting: Internal Medicine

## 2015-05-05 ENCOUNTER — Emergency Department: Payer: Medicaid Other

## 2015-05-05 DIAGNOSIS — Z8249 Family history of ischemic heart disease and other diseases of the circulatory system: Secondary | ICD-10-CM | POA: Insufficient documentation

## 2015-05-05 DIAGNOSIS — I509 Heart failure, unspecified: Secondary | ICD-10-CM | POA: Diagnosis not present

## 2015-05-05 DIAGNOSIS — Z7982 Long term (current) use of aspirin: Secondary | ICD-10-CM | POA: Diagnosis not present

## 2015-05-05 DIAGNOSIS — J189 Pneumonia, unspecified organism: Secondary | ICD-10-CM | POA: Diagnosis present

## 2015-05-05 DIAGNOSIS — R05 Cough: Secondary | ICD-10-CM | POA: Diagnosis not present

## 2015-05-05 DIAGNOSIS — F17200 Nicotine dependence, unspecified, uncomplicated: Secondary | ICD-10-CM | POA: Diagnosis not present

## 2015-05-05 DIAGNOSIS — J188 Other pneumonia, unspecified organism: Secondary | ICD-10-CM | POA: Diagnosis not present

## 2015-05-05 DIAGNOSIS — Z8701 Personal history of pneumonia (recurrent): Secondary | ICD-10-CM | POA: Insufficient documentation

## 2015-05-05 DIAGNOSIS — E871 Hypo-osmolality and hyponatremia: Secondary | ICD-10-CM | POA: Diagnosis not present

## 2015-05-05 DIAGNOSIS — I10 Essential (primary) hypertension: Secondary | ICD-10-CM | POA: Insufficient documentation

## 2015-05-05 DIAGNOSIS — F1010 Alcohol abuse, uncomplicated: Secondary | ICD-10-CM | POA: Diagnosis not present

## 2015-05-05 DIAGNOSIS — J9610 Chronic respiratory failure, unspecified whether with hypoxia or hypercapnia: Secondary | ICD-10-CM | POA: Insufficient documentation

## 2015-05-05 DIAGNOSIS — R079 Chest pain, unspecified: Secondary | ICD-10-CM | POA: Insufficient documentation

## 2015-05-05 DIAGNOSIS — Z79899 Other long term (current) drug therapy: Secondary | ICD-10-CM | POA: Diagnosis not present

## 2015-05-05 DIAGNOSIS — Z7951 Long term (current) use of inhaled steroids: Secondary | ICD-10-CM | POA: Insufficient documentation

## 2015-05-05 DIAGNOSIS — R000 Tachycardia, unspecified: Secondary | ICD-10-CM | POA: Insufficient documentation

## 2015-05-05 DIAGNOSIS — F419 Anxiety disorder, unspecified: Secondary | ICD-10-CM | POA: Insufficient documentation

## 2015-05-05 DIAGNOSIS — J441 Chronic obstructive pulmonary disease with (acute) exacerbation: Secondary | ICD-10-CM | POA: Insufficient documentation

## 2015-05-05 DIAGNOSIS — Z888 Allergy status to other drugs, medicaments and biological substances status: Secondary | ICD-10-CM | POA: Diagnosis not present

## 2015-05-05 HISTORY — DX: Heart failure, unspecified: I50.9

## 2015-05-05 HISTORY — DX: Anxiety disorder, unspecified: F41.9

## 2015-05-05 HISTORY — DX: Essential (primary) hypertension: I10

## 2015-05-05 HISTORY — DX: Chronic obstructive pulmonary disease, unspecified: J44.9

## 2015-05-05 LAB — BASIC METABOLIC PANEL
Anion gap: 13 (ref 5–15)
BUN: 12 mg/dL (ref 6–20)
CALCIUM: 9.1 mg/dL (ref 8.9–10.3)
CO2: 25 mmol/L (ref 22–32)
CREATININE: 0.98 mg/dL (ref 0.61–1.24)
Chloride: 92 mmol/L — ABNORMAL LOW (ref 101–111)
GFR calc Af Amer: >60 mL/min (ref >60)
Glucose, Bld: 115 mg/dL — ABNORMAL HIGH (ref 65–99)
Potassium: 3.5 mmol/L (ref 3.5–5.1)
SODIUM: 130 mmol/L — AB (ref 135–145)

## 2015-05-05 LAB — CBC
HCT: 41.6 % (ref 40.0–52.0)
Hemoglobin: 14.6 g/dL (ref 13.0–18.0)
MCH: 35.2 pg — ABNORMAL HIGH (ref 26.0–34.0)
MCHC: 35.1 g/dL (ref 32.0–36.0)
MCV: 100.1 fL — AB (ref 80.0–100.0)
PLATELETS: 128 10*3/uL — AB (ref 150–440)
RBC: 4.16 MIL/uL — ABNORMAL LOW (ref 4.40–5.90)
RDW: 12.5 % (ref 11.5–14.5)
WBC: 10.5 10*3/uL (ref 3.8–10.6)

## 2015-05-05 MED ORDER — LORAZEPAM 2 MG/ML IJ SOLN
1.0000 mg | Freq: Once | INTRAMUSCULAR | Status: AC
  Administered 2015-05-05: 1 mg via INTRAVENOUS
  Filled 2015-05-05: qty 1

## 2015-05-05 MED ORDER — SODIUM CHLORIDE 0.9 % IJ SOLN
3.0000 mL | Freq: Two times a day (BID) | INTRAMUSCULAR | Status: DC
  Administered 2015-05-05 – 2015-05-07 (×2): 3 mL via INTRAVENOUS

## 2015-05-05 MED ORDER — ONDANSETRON HCL 4 MG/2ML IJ SOLN
4.0000 mg | Freq: Once | INTRAMUSCULAR | Status: AC
  Administered 2015-05-05: 4 mg via INTRAVENOUS
  Filled 2015-05-05: qty 2

## 2015-05-05 MED ORDER — LISINOPRIL 5 MG PO TABS
2.5000 mg | ORAL_TABLET | Freq: Two times a day (BID) | ORAL | Status: DC
  Administered 2015-05-05 – 2015-05-07 (×4): 2.5 mg via ORAL
  Filled 2015-05-05 (×4): qty 1

## 2015-05-05 MED ORDER — ACETAMINOPHEN 325 MG PO TABS
650.0000 mg | ORAL_TABLET | Freq: Four times a day (QID) | ORAL | Status: DC | PRN
  Administered 2015-05-06 (×2): 650 mg via ORAL
  Filled 2015-05-05 (×2): qty 2

## 2015-05-05 MED ORDER — SODIUM CHLORIDE 0.9 % IV SOLN
INTRAVENOUS | Status: DC
  Administered 2015-05-05 – 2015-05-07 (×2): via INTRAVENOUS

## 2015-05-05 MED ORDER — ASPIRIN 325 MG PO TABS: 650.0000 mg | ORAL_TABLET | ORAL | Status: DC | PRN

## 2015-05-05 MED ORDER — AZITHROMYCIN 500 MG IV SOLR
500.0000 mg | INTRAVENOUS | Status: DC
Start: 2015-05-06 — End: 2015-05-07
  Administered 2015-05-06: 500 mg via INTRAVENOUS
  Filled 2015-05-05 (×2): qty 500

## 2015-05-05 MED ORDER — IPRATROPIUM-ALBUTEROL 0.5-2.5 (3) MG/3ML IN SOLN: 3.0000 mL | RESPIRATORY_TRACT | Status: DC | PRN

## 2015-05-05 MED ORDER — OXYCODONE HCL 5 MG PO TABS
5.0000 mg | ORAL_TABLET | ORAL | Status: DC | PRN
  Administered 2015-05-07: 5 mg via ORAL
  Filled 2015-05-05: qty 1

## 2015-05-05 MED ORDER — ONDANSETRON HCL 4 MG/2ML IJ SOLN: 4.0000 mg | Freq: Four times a day (QID) | INTRAMUSCULAR | Status: DC | PRN

## 2015-05-05 MED ORDER — IPRATROPIUM-ALBUTEROL 0.5-2.5 (3) MG/3ML IN SOLN
3.0000 mL | Freq: Once | RESPIRATORY_TRACT | Status: AC
  Administered 2015-05-05: 3 mL via RESPIRATORY_TRACT
  Filled 2015-05-05: qty 3

## 2015-05-05 MED ORDER — DEXTROSE 5 % IV SOLN
1.0000 g | INTRAVENOUS | Status: DC
  Administered 2015-05-06: 1 g via INTRAVENOUS
  Filled 2015-05-05 (×2): qty 10

## 2015-05-05 MED ORDER — METHYLPREDNISOLONE SODIUM SUCC 125 MG IJ SOLR
60.0000 mg | INTRAMUSCULAR | Status: DC
  Administered 2015-05-05 – 2015-05-06 (×2): 60 mg via INTRAVENOUS
  Filled 2015-05-05 (×2): qty 2

## 2015-05-05 MED ORDER — MORPHINE SULFATE (PF) 4 MG/ML IV SOLN
4.0000 mg | Freq: Once | INTRAVENOUS | Status: AC
  Administered 2015-05-05: 4 mg via INTRAVENOUS
  Filled 2015-05-05: qty 1

## 2015-05-05 MED ORDER — FAMOTIDINE 20 MG PO TABS
20.0000 mg | ORAL_TABLET | Freq: Two times a day (BID) | ORAL | Status: DC
  Administered 2015-05-05 – 2015-05-07 (×4): 20 mg via ORAL
  Filled 2015-05-05 (×5): qty 1

## 2015-05-05 MED ORDER — LORAZEPAM 2 MG/ML IJ SOLN
2.0000 mg | INTRAMUSCULAR | Status: DC | PRN
  Administered 2015-05-06 (×2): 2 mg via INTRAVENOUS
  Administered 2015-05-07: 3 mg via INTRAVENOUS
  Filled 2015-05-05 (×2): qty 1
  Filled 2015-05-05: qty 2

## 2015-05-05 MED ORDER — MORPHINE SULFATE (PF) 2 MG/ML IV SOLN: 2.0000 mg | INTRAVENOUS | Status: DC | PRN

## 2015-05-05 MED ORDER — AZITHROMYCIN 250 MG PO TABS
500.0000 mg | ORAL_TABLET | Freq: Once | ORAL | Status: AC
  Administered 2015-05-05: 500 mg via ORAL
  Filled 2015-05-05: qty 2

## 2015-05-05 MED ORDER — ACETAMINOPHEN 650 MG RE SUPP: 650.0000 mg | Freq: Four times a day (QID) | RECTAL | Status: DC | PRN

## 2015-05-05 MED ORDER — DEXTROSE 5 % IV SOLN
1.0000 g | Freq: Once | INTRAVENOUS | Status: AC
  Administered 2015-05-05: 1 g via INTRAVENOUS
  Filled 2015-05-05: qty 10

## 2015-05-05 MED ORDER — VITAMIN B-1 100 MG PO TABS
100.0000 mg | ORAL_TABLET | Freq: Every day | ORAL | Status: DC
  Administered 2015-05-05 – 2015-05-07 (×3): 100 mg via ORAL
  Filled 2015-05-05 (×3): qty 1

## 2015-05-05 MED ORDER — METOPROLOL TARTRATE 50 MG PO TABS
50.0000 mg | ORAL_TABLET | Freq: Two times a day (BID) | ORAL | Status: DC
  Administered 2015-05-05 – 2015-05-07 (×4): 50 mg via ORAL
  Filled 2015-05-05 (×4): qty 1

## 2015-05-05 MED ORDER — SODIUM CHLORIDE 0.9 % IV BOLUS (SEPSIS)
1000.0000 mL | Freq: Once | INTRAVENOUS | Status: AC
  Administered 2015-05-05: 1000 mL via INTRAVENOUS

## 2015-05-05 MED ORDER — HEPARIN SODIUM (PORCINE) 5000 UNIT/ML IJ SOLN
5000.0000 [IU] | Freq: Three times a day (TID) | INTRAMUSCULAR | Status: DC
  Administered 2015-05-05 – 2015-05-06 (×2): 5000 [IU] via SUBCUTANEOUS
  Filled 2015-05-05 (×2): qty 1

## 2015-05-05 MED ORDER — BUDESONIDE-FORMOTEROL FUMARATE 160-4.5 MCG/ACT IN AERO
2.0000 | INHALATION_SPRAY | Freq: Two times a day (BID) | RESPIRATORY_TRACT | Status: DC
  Administered 2015-05-05 – 2015-05-07 (×4): 2 via RESPIRATORY_TRACT
  Filled 2015-05-05: qty 6

## 2015-05-05 MED ORDER — ONDANSETRON HCL 4 MG PO TABS: 4.0000 mg | ORAL_TABLET | Freq: Four times a day (QID) | ORAL | Status: DC | PRN

## 2015-05-05 MED ORDER — FOLIC ACID 1 MG PO TABS
1.0000 mg | ORAL_TABLET | Freq: Every day | ORAL | Status: DC
  Administered 2015-05-05 – 2015-05-07 (×3): 1 mg via ORAL
  Filled 2015-05-05 (×3): qty 1

## 2015-05-05 MED ORDER — ADULT MULTIVITAMIN W/MINERALS CH
1.0000 | ORAL_TABLET | Freq: Every day | ORAL | Status: DC
  Administered 2015-05-05 – 2015-05-07 (×3): 1 via ORAL
  Filled 2015-05-05 (×3): qty 1

## 2015-05-05 MED ORDER — METHYLPREDNISOLONE SODIUM SUCC 125 MG IJ SOLR
125.0000 mg | Freq: Once | INTRAMUSCULAR | Status: AC
  Administered 2015-05-05: 125 mg via INTRAVENOUS
  Filled 2015-05-05: qty 2

## 2015-05-05 MED ORDER — CLONIDINE HCL 0.1 MG PO TABS
0.1000 mg | ORAL_TABLET | Freq: Two times a day (BID) | ORAL | Status: DC
Start: 2015-05-05 — End: 2015-05-07
  Administered 2015-05-05 – 2015-05-07 (×4): 0.1 mg via ORAL
  Filled 2015-05-05 (×4): qty 1

## 2015-05-05 NOTE — H&P (Signed)
[place] [facility] [place] NAME: George Arellano    MR#:  [id number]  DATE OF BIRTH:  03-11-1966   DATE OF ADMISSION:  05/05/2015  PRIMARY CARE PHYSICIAN: Lavonne Chick, MD   REQUESTING/REFERRING PHYSICIAN: Paduchowski  CHIEF COMPLAINT:   Chief Complaint  Patient presents with  . Shortness of Breath    HISTORY OF PRESENT ILLNESS:  George Arellano  is a 49 y.o. male with a known history of COPD with chronic respiratory failure 2 L at baseline and shortness breath. describes 2 day duration of shortness of breath associated cough, dry nonproductive. subjective fevers chills as well. shortness of breath described mainly has dyspnea on exertion. given duration of symptoms and also very sensitive to hospital further workup and evaluation.  PAST MEDICAL HISTORY:   Past Medical History  Diagnosis Date  . COPD (chronic obstructive pulmonary disease) ([facility])   . CHF (congestive heart failure) ([employer])   . Hypertension   . Anxiety     PAST SURGICAL HISTORY:  History reviewed. No pertinent past surgical history.  SOCIAL HISTORY:   Social History  Substance Use Topics  . Smoking status: Current Every Day Smoker  . Smokeless tobacco: Not on file  . Alcohol Use: Yes    FAMILY HISTORY:   Family History  Problem Relation Age of Onset  . Hypertension Other     DRUG ALLERGIES:   Allergies  Allergen Reactions  . Levaquin [Levofloxacin] Other (See Comments)    Reaction:  Unknown     REVIEW OF SYSTEMS:  REVIEW OF SYSTEMS:  CONSTITUTIONAL: Denies fevers, chills, fatigue, weakness.  EYES: Denies blurred vision, double vision, or eye pain.  EARS, NOSE, THROAT: Denies tinnitus, ear pain, hearing loss.  RESPIRATORY: Positive cough, shortness of breath, wheezing  CARDIOVASCULAR: Denies chest pain, palpitations, edema.  GASTROINTESTINAL: Denies nausea, vomiting, diarrhea, abdominal pain.  GENITOURINARY: Denies dysuria, hematuria.   ENDOCRINE: Denies nocturia or thyroid problems. HEMATOLOGIC AND LYMPHATIC: Denies easy bruising or bleeding.  SKIN: Denies rash or lesions.  MUSCULOSKELETAL: Denies pain in neck, back, shoulder, knees, hips, or further arthritic symptoms.  NEUROLOGIC: Denies paralysis, paresthesias.  PSYCHIATRIC: Denies anxiety or depressive symptoms. Otherwise full review of systems performed by me is negative.   MEDICATIONS AT HOME:   Prior to Admission medications   Medication Sig Start Date End Date Taking? Authorizing Provider  albuterol (PROVENTIL HFA;VENTOLIN HFA) 108 (90 BASE) MCG/ACT inhaler Inhale 2 puffs into the lungs every 6 (six) hours as needed for wheezing or shortness of breath.   Yes Historical Provider, MD  aspirin 325 MG tablet Take 650 mg by mouth every 4 (four) hours as needed for headache.   Yes Historical Provider, MD  budesonide-formoterol (SYMBICORT) 160-4.5 MCG/ACT inhaler Inhale 2 puffs into the lungs 2 (two) times daily.   Yes Historical Provider, MD  cloNIDine (CATAPRES) 0.1 MG tablet Take 0.1 mg by mouth 2 (two) times daily.   Yes Historical Provider, MD  furosemide (LASIX) 20 MG tablet Take 20 mg by mouth daily.   Yes Historical Provider, MD  Ipratropium-Albuterol (COMBIVENT RESPIMAT) 20-100 MCG/ACT AERS respimat Inhale 2 puffs into the lungs every 6 (six) hours as needed for wheezing or shortness of breath.   Yes Historical Provider, MD  ipratropium-albuterol (DUONEB) 0.5-2.5 (3) MG/3ML SOLN Take 3 mLs by nebulization every 6 (six) hours as needed (for shortness of breath/wheezing).   Yes Historical Provider, MD  lisinopril (PRINIVIL,ZESTRIL) 2.5 MG tablet Take 2.5 mg by mouth 2 (  two) times daily.   Yes Historical Provider, MD  metoprolol (LOPRESSOR) 50 MG tablet Take 50 mg by mouth 2 (two) times daily.   Yes Historical Provider, MD  ranitidine (ZANTAC) 150 MG tablet Take 150 mg by mouth 2 (two) times daily.   Yes Historical Provider, MD      VITAL SIGNS:  Blood pressure  115/73, pulse 121, temperature 99.5 F (37.5 C), temperature source Oral, resp. rate 21, height 5\' 8"  (1.727 m), weight 170 lb (77.111 kg), SpO2 93 %.  PHYSICAL EXAMINATION:  VITAL SIGNS: Filed Vitals:   05/05/15 2000  BP: 115/73  Pulse: 121  Temp:   Resp: 21   GENERAL:48 y.o.male currently in no acute distress.  HEAD: Normocephalic, atraumatic.  EYES: Pupils equal, round, reactive to light. Extraocular muscles intact. No scleral icterus.  MOUTH: Moist mucosal membrane. Dentition intact. No abscess noted.  EAR, NOSE, THROAT: Clear without exudates. No external lesions.  NECK: Supple. No thyromegaly. No nodules. No JVD.  PULMONARY: Diffuse coarse breath sounds scattered rhonchi as well as extremely wheezing most prominent in the left base . No use of accessory muscles, Good respiratory effort. good air entry bilaterally CHEST: Nontender to palpation.  CARDIOVASCULAR: S1 and S2. Tachycardic No murmurs, rubs, or gallops. No edema. Pedal pulses 2+ bilaterally.  GASTROINTESTINAL: Soft, nontender, nondistended. No masses. Positive bowel sounds. No hepatosplenomegaly.  MUSCULOSKELETAL: No swelling, clubbing, or edema. Range of motion full in all extremities.  NEUROLOGIC: Cranial nerves II through XII are intact. No gross focal neurological deficits. Sensation intact. Reflexes intact.  SKIN: No ulceration, lesions, rashes, or cyanosis. Skin warm and dry. Turgor intact.  PSYCHIATRIC: Mood, affect within normal limits. The patient is awake, alert and oriented x 3. Insight, judgment intact.    LABORATORY PANEL:   CBC  Recent Labs Lab 05/05/15 1643  WBC 10.5  HGB 14.6  HCT 41.6  PLT 128*   ------------------------------------------------------------------------------------------------------------------  Chemistries   Recent Labs Lab 05/05/15 1643  NA 130*  K 3.5  CL 92*  CO2 25  GLUCOSE 115*  BUN 12  CREATININE 0.98  CALCIUM 9.1    ------------------------------------------------------------------------------------------------------------------  Cardiac Enzymes No results for input(s): TROPONINI in the last 168 hours. ------------------------------------------------------------------------------------------------------------------  RADIOLOGY:  Dg Chest 2 View  05/05/2015  CLINICAL DATA:  Cough, shortness of breath and nausea for 3 days, history hypertension, CHF, COPD, smoker EXAM: CHEST  2 VIEW COMPARISON:  09/25/2014 FINDINGS: Normal heart size, mediastinal contours and pulmonary vascularity. Increased perihilar markings bilaterally question infiltrate. No pleural effusion or pneumothorax. Bones unremarkable. Mild atherosclerotic calcification aortic arch. IMPRESSION: Bronchitic changes with questionable perihilar infiltrates. Electronically Signed   By: Lavonia Dana M.D.   On: 05/05/2015 17:06    EKG:   Orders placed or performed during the hospital encounter of 05/05/15  . ED EKG  . ED EKG  . EKG 12-Lead  . EKG 12-Lead    IMPRESSION AND PLAN:   49 year old Caucasian gentleman history of COPD chronic respiratory fragility since camping with shortness of breath  1. Community acquired pneumonia: Antibiotic coverage ceftriaxone/azithromycin, breathing treatments, DuoNeb nebs will Solu-Medrol given wheezing, continue Symbicort as well as other home breathing treatments 2. Hyponatremia: Hold Lasix for now follow sodium level III. Essential hypertension: Clonidine, lisinopril, Lopressor 4. Alcohol abuse: CIWA protocol 5. Venous remembers provider: Heparin subcutaneous     All the records are reviewed and case discussed with ED provider. Management plans discussed with the patient, family and they are in agreement.  CODE STATUS: Full  TOTAL TIME TAKING CARE OF THIS PATIENT: 35 minutes.    Hower,  Karenann Cai.D on 05/05/2015 at 9:40 PM  Between 7am to 6pm - Pager - [phone]  After 6pm: House Pager:  - [facility]  Office  [phone]  CC: Primary care physician; Lavonne Chick, MD

## 2015-05-05 NOTE — ED Notes (Signed)
Patient with cough, shortness of breath and nausea for three days.

## 2015-05-05 NOTE — Progress Notes (Signed)
Pt brought home medications to hospital.  Medications sent to pharmacy for storage.  Please return at time of discharge.   George Arellano

## 2015-05-05 NOTE — ED Notes (Signed)
Pt's O2 sats 91-92% RA. Pt placed on [place] at 2L/min. Pt wears O2 at 2L/min at home. O2 sats then 95%.

## 2015-05-05 NOTE — ED Provider Notes (Signed)
[facility] Emergency Department Provider Note  Time seen: 6:37 PM  I have reviewed the triage vital signs and the nursing notes.   HISTORY  Chief Complaint Shortness of Breath    HPI George Arellano is a 49 y.o. male with a past medical history of COPD, CHF, hypertension, anxiety, alcoholism, presents the emergency department with 3 days of cough, congestion, and chills. According to the patient for the past 3 days he has had shortness of breath, with cough and congestion. Subjective temperature/fever, but has not measured any. Patient states a history of pneumonia, which he states this feels very similar to. Denies any nausea or vomiting. Denies any chest pain. Describes his dyspnea as moderate.     Past Medical History  Diagnosis Date  . COPD (chronic obstructive pulmonary disease) ([place])   . CHF (congestive heart failure) ([place])   . Hypertension   . Anxiety     There are no active problems to display for this patient.   History reviewed. No pertinent past surgical history.  No current outpatient prescriptions on file.  Allergies Levaquin  History reviewed. No pertinent family history.  Social History Social History  Substance Use Topics  . Smoking status: Current Every Day Smoker  . Smokeless tobacco: None  . Alcohol Use: Yes    Review of Systems Constitutional: Objective fever. Cardiovascular: Negative for chest pain. Respiratory: Moderate shortness of breath. Gastrointestinal: Negative for abdominal pain, vomiting and diarrhea. Musculoskeletal: Negative for back pain Neurological: Negative for headache 10-point ROS otherwise negative.  ____________________________________________   PHYSICAL EXAM:  VITAL SIGNS: ED Triage Vitals  Enc Vitals Group     BP 05/05/15 1629 110/97 mmHg     Pulse Rate 05/05/15 1629 116     Resp 05/05/15 1629 24     Temp 05/05/15 1629 99.5 F (37.5 C)     Temp Source 05/05/15 1629 Oral     SpO2  05/05/15 1629 94 %     Weight 05/05/15 1629 170 lb (77.111 kg)     Height 05/05/15 1629 5\' 8"  (1.727 m)     Head Cir --      Peak Flow --      Pain Score 05/05/15 1833 7     Pain Loc --      Pain Edu? --      Excl. in [place]? --    Constitutional: Alert and oriented. Well appearing and in no distress. Eyes: Normal exam ENT   Head: Normocephalic and atraumatic.   Mouth/Throat: Mucous membranes are moist. Cardiovascular: Regular rhythm, rate around 110 bpm. Respiratory: Mild tachypnea, moderate wheezes bilaterally. No rhonchi. Gastrointestinal: Soft and nontender. No distention.  Musculoskeletal: Nontender with normal range of motion in all extremities. Older bruising present on bilateral upper extremities. Neurologic:  Normal speech and language. No gross focal neurologic deficits  Psychiatric: Mood and affect are normal. Speech and behavior are normal.  ____________________________________________    EKG  EKG reviewed and interpreted by myself shows sinus tachycardia at 113 bpm, narrow QRS, normal axis, normal intervals, nonspecific ST changes present. No ST elevations noted.  ____________________________________________    RADIOLOGY  Chest x-ray consistent with perihilar infiltrates.  ____________________________________________   INITIAL IMPRESSION / ASSESSMENT AND PLAN / ED COURSE  Pertinent labs & imaging results that were available during my care of the patient were reviewed by me and considered in my medical decision making (see chart for details).  X-ray consistent with likely pneumonia.  Patient has a temperature of 99.5  in the emergency department, moderate wheeze on examination. We will treat with Solu-Medrol, DuoNeb's, Rocephin and Zithromax. Patient is mildly tachycardic at 115 bpm. Patient does admit heavy daily alcohol use, no alcohol use today. Suspected degree of his tachycardia is likely due to withdrawal. We will dose 1 mg of IV Ativan, 1 L of normal  saline, and monitor closely in the emergency department.  With persistent tachycardia despite treatment, room-air sat around 90-92%, we will admit the patient to the hospital for further workup and evaluation. ____________________________________________   FINAL CLINICAL IMPRESSION(S) / ED DIAGNOSES  pneumonia    Harvest Dark, MD 05/05/15 2023

## 2015-05-05 NOTE — ED Notes (Signed)
Assumed pt care at this time. NAD noted. RR even and nonlabored. Family at bedside. Will continue to monitor. 

## 2015-05-06 LAB — CREATININE, SERUM
Creatinine, Ser: 0.85 mg/dL (ref 0.61–1.24)
GFR calc non Af Amer: >60 mL/min (ref >60)

## 2015-05-06 LAB — CBC
HEMATOCRIT: 38.6 % — AB (ref 40.0–52.0)
Hemoglobin: 13.2 g/dL (ref 13.0–18.0)
MCH: 34.7 pg — AB (ref 26.0–34.0)
MCHC: 34.2 g/dL (ref 32.0–36.0)
MCV: 101.3 fL — AB (ref 80.0–100.0)
Platelets: 115 10*3/uL — ABNORMAL LOW (ref 150–440)
RBC: 3.81 MIL/uL — AB (ref 4.40–5.90)
RDW: 12.3 % (ref 11.5–14.5)
WBC: 4.7 10*3/uL (ref 3.8–10.6)

## 2015-05-06 MED ORDER — IPRATROPIUM-ALBUTEROL 0.5-2.5 (3) MG/3ML IN SOLN
3.0000 mL | Freq: Four times a day (QID) | RESPIRATORY_TRACT | Status: DC
  Administered 2015-05-06 – 2015-05-07 (×3): 3 mL via RESPIRATORY_TRACT
  Filled 2015-05-06 (×3): qty 3

## 2015-05-06 MED ORDER — ENOXAPARIN SODIUM 40 MG/0.4ML ~~LOC~~ SOLN
40.0000 mg | SUBCUTANEOUS | Status: DC
  Administered 2015-05-06: 40 mg via SUBCUTANEOUS

## 2015-05-06 NOTE — Progress Notes (Signed)
Initial Nutrition Assessment     INTERVENTION:   Meals and Snacks: Cater to patient preferences; recommend smaller, more frequent meals. Pt content with snacks from nourishment room on patient floor  (ie graham crackers and peanut butter,etc) at present. Will order scheduled snacks if requested. Pt reports he can chew current diet order, will not recommend downgrading diet at this time Medical Food Supplement Therapy: if po intake inadequate on follow-up, recommend addition of nutritional supplement  NUTRITION DIAGNOSIS:   Inadequate oral intake related to poor appetite as evidenced by per patient/family report.  GOAL:   Patient will meet greater than or equal to 90% of their needs  MONITOR:    (Energy intake, Anthropometrics, Electrolyte/Renal Profile, Digestive System)  REASON FOR ASSESSMENT:   Malnutrition Screening Tool    ASSESSMENT:     Pt admitted with pneumonia, hyponatremia, on CIWA protocol for hx of EtOH abuse   Past Medical History  Diagnosis Date  . COPD (chronic obstructive pulmonary disease) ([place])   . CHF (congestive heart failure) ([place])   . Hypertension   . Anxiety     Diet Order:  Diet Heart Room service appropriate?: Yes; Fluid consistency:: Thin   Energy Intake: pt reports good appetite at present; recorded po intake 100% at breakfast. Pt did not eat 100% at lunch today but did eat fairly well.  Food and nutrition related history: pt reports inability to keep any food down for the past 3 weeks. Prior to this, pt reports eating approximately 1 meal per day (eats a lot of frozen meals). Pt reports loss of appetite since he got his "new teeth" about 1 year ago. Pt reports the bottom plate of teeth do not fit well and move around when he eats. He typically takes them out and can still chew most foods but has had to modify his diet at home.   Electrolyte and Renal Profile:  Recent Labs Lab 05/05/15 1643 05/06/15 0542  BUN 12  --   CREATININE 0.98 0.85   NA 130*  --   K 3.5  --    Meds: solumedrol, MVI  Nutrition Focused Physical Exam: Nutrition-Focused physical exam completed. Findings are WDL for fat depletion, muscle depletion, and edema.   Height:   Ht Readings from Last 1 Encounters:  05/05/15 5\' 8"  (1.727 m)    Weight: pt believes he has lost weight in the last 3 weeks but does not know how much. Pt reports 70 pound wt loss in >1 year. 29% wt loss per report  Wt Readings from Last 1 Encounters:  05/05/15 170 lb (77.111 kg)    BMI:  Body mass index is 25.85 kg/(m^2).  LOW Care Level  Kerman Passey MS, [place], LDN [phone] Pager

## 2015-05-06 NOTE — Progress Notes (Signed)
Pt ambulated around the nurses station x 1, stand by assist, on room air.  No complaints or shortness of breath. Will continue to monitor. Jessee Avers

## 2015-05-06 NOTE — Progress Notes (Signed)
[place] at [place] NAME: George Arellano    MR#:  [id number]  DATE OF BIRTH:  Mar 28, 1966  SUBJECTIVE:  CHIEF COMPLAINT:   Chief Complaint  Patient presents with  . Shortness of Breath     C/o pain in chest with cough.  REVIEW OF SYSTEMS:  CONSTITUTIONAL: No fever, fatigue or weakness.  EYES: No blurred or double vision.  EARS, NOSE, AND THROAT: No tinnitus or ear pain.  RESPIRATORY: positive for cough, mild shortness of breath, no wheezing or hemoptysis.  CARDIOVASCULAR: positive for chest pain with cough, no orthopnea, edema.  GASTROINTESTINAL: No nausea, vomiting, diarrhea or abdominal pain.  GENITOURINARY: No dysuria, hematuria.  ENDOCRINE: No polyuria, nocturia,  HEMATOLOGY: No anemia, easy bruising or bleeding SKIN: No rash or lesion. MUSCULOSKELETAL: No joint pain or arthritis.   NEUROLOGIC: No tingling, numbness, weakness.  PSYCHIATRY: No anxiety or depression.   ROS  DRUG ALLERGIES:   Allergies  Allergen Reactions  . Levaquin [Levofloxacin] Other (See Comments)    Reaction:  Unknown     VITALS:  Blood pressure 146/86, pulse 72, temperature 97.7 F (36.5 C), temperature source Oral, resp. rate 16, height 5\' 8"  (1.727 m), weight 77.111 kg (170 lb), SpO2 97 %.  PHYSICAL EXAMINATION:  GENERAL:  49 y.o.-year-old patient lying in the bed with no acute distress.  EYES: Pupils equal, round, reactive to light and accommodation. No scleral icterus. Extraocular muscles intact.  HEENT: Head atraumatic, normocephalic. Oropharynx and nasopharynx clear.  NECK:  Supple, no jugular venous distention. No thyroid enlargement, no tenderness.  LUNGS: Normal breath sounds bilaterally, expiratory wheezing, no crepitation. No use of accessory muscles of respiration.  CARDIOVASCULAR: S1, S2 normal. No murmurs, rubs, or gallops.  ABDOMEN: Soft, nontender, nondistended. Bowel sounds present. No organomegaly or mass.  EXTREMITIES: No  pedal edema, cyanosis, or clubbing.  NEUROLOGIC: Cranial nerves II through XII are intact. Muscle strength 5/5 in all extremities. Sensation intact. Gait not checked.  PSYCHIATRIC: The patient is alert and oriented x 3.  SKIN: No obvious rash, lesion, or ulcer.   Physical Exam LABORATORY PANEL:   CBC  Recent Labs Lab 05/06/15 0542  WBC 4.7  HGB 13.2  HCT 38.6*  PLT 115*   ------------------------------------------------------------------------------------------------------------------  Chemistries   Recent Labs Lab 05/05/15 1643 05/06/15 0542  NA 130*  --   K 3.5  --   CL 92*  --   CO2 25  --   GLUCOSE 115*  --   BUN 12  --   CREATININE 0.98 0.85  CALCIUM 9.1  --    ------------------------------------------------------------------------------------------------------------------  Cardiac Enzymes No results for input(s): TROPONINI in the last 168 hours. ------------------------------------------------------------------------------------------------------------------  RADIOLOGY:  Dg Chest 2 View  05/05/2015  CLINICAL DATA:  Cough, shortness of breath and nausea for 3 days, history hypertension, CHF, COPD, smoker EXAM: CHEST  2 VIEW COMPARISON:  09/25/2014 FINDINGS: Normal heart size, mediastinal contours and pulmonary vascularity. Increased perihilar markings bilaterally question infiltrate. No pleural effusion or pneumothorax. Bones unremarkable. Mild atherosclerotic calcification aortic arch. IMPRESSION: Bronchitic changes with questionable perihilar infiltrates. Electronically Signed   By: Lavonia Dana M.D.   On: 05/05/2015 17:06    ASSESSMENT AND PLAN:   Principal Problem:   Community acquired pneumonia  49 year old Caucasian gentleman history of COPD chronic respiratory fragility since camping with shortness of breath  1. Community acquired pneumonia: with COPD exacerbation  Antibiotic coverage ceftriaxone/azithromycin, breathing treatments,  DuoNeb nebs  ,Solu-Medrol given wheezing, continue  other home breathing treatments 2. Hyponatremia: Hold Lasix for now follow sodium level III. Essential hypertension: Clonidine, lisinopril, Lopressor 4. Alcohol abuse: CIWA protocol 5. Venous remembers provider: Heparin subcutaneous 6. Smoking- councelled for 4 min to quit smoking.  All the records are reviewed and case discussed with Care Management/Social Workerr. Management plans discussed with the patient, family and they are in agreement.  CODE STATUS: full  TOTAL TIME TAKING CARE OF THIS PATIENT: 35 minutes.     POSSIBLE D/C IN 1-2 DAYS, DEPENDING ON CLINICAL CONDITION.   Vaughan Basta M.D on 05/06/2015   Between 7am to 6pm - Pager - [phone]  After 6pm go to [email or web address] - password [email or web address] [facility]  Office  [phone]  CC: Primary care physician; Lavonne Chick, MD  Note: This dictation was prepared with Dragon dictation along with smaller phrase technology. Any transcriptional errors that result from this process are unintentional.

## 2015-05-07 MED ORDER — PREDNISONE 10 MG (21) PO TBPK: ORAL_TABLET | ORAL | Status: DC

## 2015-05-07 MED ORDER — AZITHROMYCIN 250 MG PO TABS: 250.0000 mg | ORAL_TABLET | Freq: Every day | ORAL | Status: AC

## 2015-05-07 MED ORDER — GUAIFENESIN 100 MG/5ML PO SOLN: 5.0000 mL | Freq: Four times a day (QID) | ORAL | Status: DC | PRN

## 2015-05-07 MED ORDER — GUAIFENESIN 100 MG/5ML PO SOLN
5.0000 mL | ORAL | Status: DC | PRN
  Administered 2015-05-07: 100 mg via ORAL
  Filled 2015-05-07: qty 10

## 2015-05-07 MED ORDER — OXYCODONE HCL 5 MG PO TABS: 5.0000 mg | ORAL_TABLET | Freq: Four times a day (QID) | ORAL | Status: DC | PRN

## 2015-05-07 MED ORDER — CEFUROXIME AXETIL 250 MG PO TABS: 250.0000 mg | ORAL_TABLET | Freq: Two times a day (BID) | ORAL | Status: AC

## 2015-05-07 NOTE — Discharge Summary (Signed)
[place] at [place] NAME: Erwin Nishiyama    MR#:  [id number]  DATE OF BIRTH:  05-31-1966  DATE OF ADMISSION:  05/05/2015 ADMITTING PHYSICIAN: Lytle Butte, MD  DATE OF DISCHARGE: 05/07/2015  PRIMARY CARE PHYSICIAN: Lavonne Chick, MD    ADMISSION DIAGNOSIS:  Community acquired pneumonia [J18.9]  DISCHARGE DIAGNOSIS:  Principal Problem:   Community acquired pneumonia   COPD exacerbation  SECONDARY DIAGNOSIS:   Past Medical History  Diagnosis Date  . COPD (chronic obstructive pulmonary disease) ([place])   . CHF (congestive heart failure) ([place])   . Hypertension   . Anxiety     HOSPITAL COURSE:   49 year old Caucasian gentleman history of COPD chronic respiratory fragility since camping with shortness of breath  1. Community acquired pneumonia: with COPD exacerbation Antibiotic coverage ceftriaxone/azithromycin, breathing treatments,  DuoNeb nebs ,Solu-Medrol given wheezing, continue other home breathing treatments   He felt better the next day, still some pain in chest while coughing, and minimal SOB, but significantly better than admission. 2. Hyponatremia: Hold Lasix for now follow sodium level III. Essential hypertension: Clonidine, lisinopril, Lopressor 4. Alcohol abuse: CIWA protocol 5. Venous remembers provider: Heparin subcutaneous 6. Smoking- councelled for 4 min to quit smoking.  DISCHARGE CONDITIONS:   stable  CONSULTS OBTAINED:  Treatment Team:  Lytle Butte, MD  DRUG ALLERGIES:   Allergies  Allergen Reactions  . Levaquin [Levofloxacin] Other (See Comments)    Reaction:  Unknown     DISCHARGE MEDICATIONS:   Current Discharge Medication List    START taking these medications   Details  azithromycin (ZITHROMAX) 250 MG tablet Take 1 tablet (250 mg total) by mouth daily. Qty: 4 each, Refills: 0    cefUROXime (CEFTIN) 250 MG tablet Take 1 tablet (250 mg total) by mouth 2 (two) times daily  with a meal. Qty: 8 tablet, Refills: 0    guaiFENesin (ROBITUSSIN) 100 MG/5ML SOLN Take 5 mLs (100 mg total) by mouth every 6 (six) hours as needed for cough or to loosen phlegm. Qty: 1200 mL, Refills: 0    oxyCODONE (OXY IR/ROXICODONE) 5 MG immediate release tablet Take 1 tablet (5 mg total) by mouth every 6 (six) hours as needed for moderate pain. Qty: 20 tablet, Refills: 0    predniSONE (STERAPRED UNI-PAK 21 TAB) 10 MG (21) TBPK tablet Take 6 tabs first day, 5 tab on day 2, then 4 on day 3rd, 3 tabs on day 4th , 2 tab on day 5th, and 1 tab on 6th day. Qty: 21 tablet, Refills: 0      CONTINUE these medications which have NOT CHANGED   Details  albuterol (PROVENTIL HFA;VENTOLIN HFA) 108 (90 BASE) MCG/ACT inhaler Inhale 2 puffs into the lungs every 6 (six) hours as needed for wheezing or shortness of breath.    aspirin 325 MG tablet Take 650 mg by mouth every 4 (four) hours as needed for headache.    budesonide-formoterol (SYMBICORT) 160-4.5 MCG/ACT inhaler Inhale 2 puffs into the lungs 2 (two) times daily.    cloNIDine (CATAPRES) 0.1 MG tablet Take 0.1 mg by mouth 2 (two) times daily.    furosemide (LASIX) 20 MG tablet Take 20 mg by mouth daily.    Ipratropium-Albuterol (COMBIVENT RESPIMAT) 20-100 MCG/ACT AERS respimat Inhale 2 puffs into the lungs every 6 (six) hours as needed for wheezing or shortness of breath.    ipratropium-albuterol (DUONEB) 0.5-2.5 (3) MG/3ML SOLN Take 3 mLs by nebulization every 6 (six)  hours as needed (for shortness of breath/wheezing).    lisinopril (PRINIVIL,ZESTRIL) 2.5 MG tablet Take 2.5 mg by mouth 2 (two) times daily.    metoprolol (LOPRESSOR) 50 MG tablet Take 50 mg by mouth 2 (two) times daily.    ranitidine (ZANTAC) 150 MG tablet Take 150 mg by mouth 2 (two) times daily.         DISCHARGE INSTRUCTIONS:    Follow with PMD in office. If you experience worsening of your admission symptoms, develop shortness of breath, life threatening  emergency, suicidal or homicidal thoughts you must seek medical attention immediately by calling 911 or calling your MD immediately  if symptoms less severe.  You Must read complete instructions/literature along with all the possible adverse reactions/side effects for all the Medicines you take and that have been prescribed to you. Take any new Medicines after you have completely understood and accept all the possible adverse reactions/side effects.   Please note  You were cared for by a hospitalist during your hospital stay. If you have any questions about your discharge medications or the care you received while you were in the hospital after you are discharged, you can call the unit and asked to speak with the hospitalist on call if the hospitalist that took care of you is not available. Once you are discharged, your primary care physician will handle any further medical issues. Please note that NO REFILLS for any discharge medications will be authorized once you are discharged, as it is imperative that you return to your primary care physician (or establish a relationship with a primary care physician if you do not have one) for your aftercare needs so that they can reassess your need for medications and monitor your lab values.    Today   CHIEF COMPLAINT:   Chief Complaint  Patient presents with  . Shortness of Breath    HISTORY OF PRESENT ILLNESS:  George Arellano  is a 49 y.o. male with a known history of COPD with chronic respiratory failure 2 L at baseline and shortness breath. describes 2 day duration of shortness of breath associated cough, dry nonproductive. subjective fevers chills as well. shortness of breath described mainly has dyspnea on exertion. given duration of symptoms and also very sensitive to hospital further workup and evaluation.   VITAL SIGNS:  Blood pressure 129/91, pulse 96, temperature 98.6 F (37 C), temperature source Oral, resp. rate 18, height 5\' 8"  (1.727 m),  weight 77.111 kg (170 lb), SpO2 95 %.  I/O:   Intake/Output Summary (Last 24 hours) at 05/07/15 1043 Last data filed at 05/07/15 0735  Gross per 24 hour  Intake    720 ml  Output   1500 ml  Net   -780 ml    PHYSICAL EXAMINATION:   GENERAL: 49 y.o.-year-old patient lying in the bed with no acute distress.  EYES: Pupils equal, round, reactive to light and accommodation. No scleral icterus. Extraocular muscles intact.  HEENT: Head atraumatic, normocephalic. Oropharynx and nasopharynx clear.  NECK: Supple, no jugular venous distention. No thyroid enlargement, no tenderness.  LUNGS: Normal breath sounds bilaterally, expiratory wheezing, no crepitation. No use of accessory muscles of respiration.  CARDIOVASCULAR: S1, S2 normal. No murmurs, rubs, or gallops.  ABDOMEN: Soft, nontender, nondistended. Bowel sounds present. No organomegaly or mass.  EXTREMITIES: No pedal edema, cyanosis, or clubbing.  NEUROLOGIC: Cranial nerves II through XII are intact. Muscle strength 5/5 in all extremities. Sensation intact. Gait not checked.  PSYCHIATRIC: The patient is alert and  oriented x 3.  SKIN: No obvious rash, lesion, or ulcer.    DATA REVIEW:   CBC  Recent Labs Lab 05/06/15 0542  WBC 4.7  HGB 13.2  HCT 38.6*  PLT 115*    Chemistries   Recent Labs Lab 05/05/15 1643 05/06/15 0542  NA 130*  --   K 3.5  --   CL 92*  --   CO2 25  --   GLUCOSE 115*  --   BUN 12  --   CREATININE 0.98 0.85  CALCIUM 9.1  --     Cardiac Enzymes No results for input(s): TROPONINI in the last 168 hours.  Microbiology Results  Results for orders placed or performed during the hospital encounter of 05/05/15  Blood culture (routine x 2)     Status: None (Preliminary result)   Collection Time: 05/05/15  8:16 PM  Result Value Ref Range Status   Specimen Description BLOOD RIGHT ARM  Final   Special Requests BOTTLES DRAWN AEROBIC AND ANAEROBIC 6CC  Final   Culture NO GROWTH < 12 HOURS   Final   Report Status PENDING  Incomplete  Blood culture (routine x 2)     Status: None (Preliminary result)   Collection Time: 05/05/15  8:20 PM  Result Value Ref Range Status   Specimen Description BLOOD LEFT ARM  Final   Special Requests BOTTLES DRAWN AEROBIC AND ANAEROBIC 6CC  Final   Culture NO GROWTH < 12 HOURS  Final   Report Status PENDING  Incomplete    RADIOLOGY:  Dg Chest 2 View  05/05/2015  CLINICAL DATA:  Cough, shortness of breath and nausea for 3 days, history hypertension, CHF, COPD, smoker EXAM: CHEST  2 VIEW COMPARISON:  09/25/2014 FINDINGS: Normal heart size, mediastinal contours and pulmonary vascularity. Increased perihilar markings bilaterally question infiltrate. No pleural effusion or pneumothorax. Bones unremarkable. Mild atherosclerotic calcification aortic arch. IMPRESSION: Bronchitic changes with questionable perihilar infiltrates. Electronically Signed   By: Lavonia Dana M.D.   On: 05/05/2015 17:06     Management plans discussed with the patient, family and they are in agreement.  CODE STATUS:     Code Status Orders        Start     Ordered   05/05/15 2123  Full code   Continuous     05/05/15 2122      TOTAL TIME TAKING CARE OF THIS PATIENT: 35 minutes.    Vaughan Basta M.D on 05/07/2015 at 10:43 AM  Between 7am to 6pm - Pager - [phone]  After 6pm go to [email or web address] - password [email or web address] [facility]  Office  [phone]  CC: Primary care physician; Lavonne Chick, MD   Note: This dictation was prepared with Dragon dictation along with smaller phrase technology. Any transcriptional errors that result from this process are unintentional.

## 2015-05-10 LAB — CULTURE, BLOOD (ROUTINE X 2)
CULTURE: NO GROWTH
Culture: NO GROWTH

## 2015-10-08 ENCOUNTER — Encounter: Payer: Self-pay | Admitting: *Deleted

## 2015-10-08 ENCOUNTER — Emergency Department
Admission: EM | Admit: 2015-10-08 | Discharge: 2015-10-09 | Disposition: A | Discharge status: Home / Self Care | Payer: Medicaid Other | Attending: Emergency Medicine | Admitting: Emergency Medicine

## 2015-10-08 ENCOUNTER — Emergency Department: Payer: Medicaid Other

## 2015-10-08 DIAGNOSIS — R110 Nausea: Secondary | ICD-10-CM | POA: Diagnosis not present

## 2015-10-08 DIAGNOSIS — F17210 Nicotine dependence, cigarettes, uncomplicated: Secondary | ICD-10-CM | POA: Insufficient documentation

## 2015-10-08 DIAGNOSIS — Z7982 Long term (current) use of aspirin: Secondary | ICD-10-CM | POA: Diagnosis not present

## 2015-10-08 DIAGNOSIS — I10 Essential (primary) hypertension: Secondary | ICD-10-CM | POA: Diagnosis not present

## 2015-10-08 DIAGNOSIS — R1013 Epigastric pain: Secondary | ICD-10-CM | POA: Diagnosis not present

## 2015-10-08 DIAGNOSIS — Z79899 Other long term (current) drug therapy: Secondary | ICD-10-CM | POA: Insufficient documentation

## 2015-10-08 DIAGNOSIS — R1010 Upper abdominal pain, unspecified: Secondary | ICD-10-CM | POA: Diagnosis present

## 2015-10-08 DIAGNOSIS — Z7951 Long term (current) use of inhaled steroids: Secondary | ICD-10-CM | POA: Diagnosis not present

## 2015-10-08 LAB — CBC
HCT: 37.7 % — ABNORMAL LOW (ref 40.0–52.0)
HEMOGLOBIN: 13.3 g/dL (ref 13.0–18.0)
MCH: 35.1 pg — ABNORMAL HIGH (ref 26.0–34.0)
MCHC: 35.4 g/dL (ref 32.0–36.0)
MCV: 99.1 fL (ref 80.0–100.0)
Platelets: 277 10*3/uL (ref 150–440)
RBC: 3.8 MIL/uL — AB (ref 4.40–5.90)
RDW: 12.2 % (ref 11.5–14.5)
WBC: 8.5 10*3/uL (ref 3.8–10.6)

## 2015-10-08 LAB — COMPREHENSIVE METABOLIC PANEL
ALBUMIN: 3.9 g/dL (ref 3.5–5.0)
ALT: 53 U/L (ref 17–63)
ANION GAP: 12 (ref 5–15)
AST: 50 U/L — ABNORMAL HIGH (ref 15–41)
Alkaline Phosphatase: 83 U/L (ref 38–126)
BILIRUBIN TOTAL: 0.9 mg/dL (ref 0.3–1.2)
BUN: 7 mg/dL (ref 6–20)
CO2: 24 mmol/L (ref 22–32)
Calcium: 9.2 mg/dL (ref 8.9–10.3)
Chloride: 95 mmol/L — ABNORMAL LOW (ref 101–111)
Creatinine, Ser: 0.75 mg/dL (ref 0.61–1.24)
GFR calc non Af Amer: >60 mL/min (ref >60)
GLUCOSE: 114 mg/dL — AB (ref 65–99)
POTASSIUM: 3.2 mmol/L — AB (ref 3.5–5.1)
SODIUM: 131 mmol/L — AB (ref 135–145)
TOTAL PROTEIN: 7.6 g/dL (ref 6.5–8.1)

## 2015-10-08 LAB — TROPONIN I: Troponin I: <0.03 ng/mL (ref <0.031)

## 2015-10-08 LAB — LIPASE, BLOOD: Lipase: 43 U/L (ref 11–51)

## 2015-10-08 MED ORDER — GI COCKTAIL ~~LOC~~
30.0000 mL | Freq: Once | ORAL | Status: AC
  Administered 2015-10-08: 30 mL via ORAL
  Filled 2015-10-08 (×2): qty 30

## 2015-10-08 MED ORDER — SUCRALFATE 1 G PO TABS: 1.0000 g | ORAL_TABLET | Freq: Four times a day (QID) | ORAL | Status: DC

## 2015-10-08 MED ORDER — DICYCLOMINE HCL 20 MG PO TABS: 20.0000 mg | ORAL_TABLET | Freq: Three times a day (TID) | ORAL | Status: DC | PRN

## 2015-10-08 NOTE — Discharge Instructions (Signed)
Please seek medical attention for any high fevers, chest pain, shortness of breath, change in behavior, persistent vomiting, bloody stool or any other new or concerning symptoms.   Abdominal Pain, Adult Many things can cause belly (abdominal) pain. Most times, the belly pain is not dangerous. Many cases of belly pain can be watched and treated at home. HOME CARE   Do not take medicines that help you go poop (laxatives) unless told to by your doctor.  Only take medicine as told by your doctor.  Eat or drink as told by your doctor. Your doctor will tell you if you should be on a special diet. GET HELP IF:  You do not know what is causing your belly pain.  You have belly pain while you are sick to your stomach (nauseous) or have runny poop (diarrhea).  You have pain while you pee or poop.  Your belly pain wakes you up at night.  You have belly pain that gets worse or better when you eat.  You have belly pain that gets worse when you eat fatty foods.  You have a fever. GET HELP RIGHT AWAY IF:   The pain does not go away within 2 hours.  You keep throwing up (vomiting).  The pain changes and is only in the right or left part of the belly.  You have bloody or tarry looking poop. MAKE SURE YOU:   Understand these instructions.  Will watch your condition.  Will get help right away if you are not doing well or get worse.   This information is not intended to replace advice given to you by your health care provider. Make sure you discuss any questions you have with your health care provider.   Document Released: 12/15/2007 Document Revised: 07/19/2014 Document Reviewed: 03/07/2013 Elsevier Interactive Patient Education 2016 Elsevier Inc. Gastritis, Adult Gastritis is soreness and swelling (inflammation) of the lining of the stomach. Gastritis can develop as a sudden onset (acute) or long-term (chronic) condition. If gastritis is not treated, it can lead to stomach bleeding and  ulcers. CAUSES  Gastritis occurs when the stomach lining is weak or damaged. Digestive juices from the stomach then inflame the weakened stomach lining. The stomach lining may be weak or damaged due to viral or bacterial infections. One common bacterial infection is the Helicobacter pylori infection. Gastritis can also result from excessive alcohol consumption, taking certain medicines, or having too much acid in the stomach.  SYMPTOMS  In some cases, there are no symptoms. When symptoms are present, they may include:  Pain or a burning sensation in the upper abdomen.  Nausea.  Vomiting.  An uncomfortable feeling of fullness after eating. DIAGNOSIS  Your caregiver may suspect you have gastritis based on your symptoms and a physical exam. To determine the cause of your gastritis, your caregiver may perform the following:  Blood or stool tests to check for the H pylori bacterium.  Gastroscopy. A thin, flexible tube (endoscope) is passed down the esophagus and into the stomach. The endoscope has a light and camera on the end. Your caregiver uses the endoscope to view the inside of the stomach.  Taking a tissue sample (biopsy) from the stomach to examine under a microscope. TREATMENT  Depending on the cause of your gastritis, medicines may be prescribed. If you have a bacterial infection, such as an H pylori infection, antibiotics may be given. If your gastritis is caused by too much acid in the stomach, H2 blockers or antacids may be given.  Your caregiver may recommend that you stop taking aspirin, ibuprofen, or other nonsteroidal anti-inflammatory drugs (NSAIDs). HOME CARE INSTRUCTIONS  Only take over-the-counter or prescription medicines as directed by your caregiver.  If you were given antibiotic medicines, take them as directed. Finish them even if you start to feel better.  Drink enough fluids to keep your urine clear or pale yellow.  Avoid foods and drinks that make your symptoms  worse, such as:  Caffeine or alcoholic drinks.  Chocolate.  Peppermint or mint flavorings.  Garlic and onions.  Spicy foods.  Citrus fruits, such as oranges, lemons, or limes.  Tomato-based foods such as sauce, chili, salsa, and pizza.  Fried and fatty foods.  Eat small, frequent meals instead of large meals. SEEK IMMEDIATE MEDICAL CARE IF:   You have black or dark red stools.  You vomit blood or material that looks like coffee grounds.  You are unable to keep fluids down.  Your abdominal pain gets worse.  You have a fever.  You do not feel better after 1 week.  You have any other questions or concerns. MAKE SURE YOU:  Understand these instructions.  Will watch your condition.  Will get help right away if you are not doing well or get worse.   This information is not intended to replace advice given to you by your health care provider. Make sure you discuss any questions you have with your health care provider.   Document Released: 06/22/2001 Document Revised: 12/28/2011 Document Reviewed: 08/11/2011 Elsevier Interactive Patient Education [employer].

## 2015-10-08 NOTE — ED Notes (Signed)
Denies N/V/D

## 2015-10-08 NOTE — ED Notes (Signed)
Pt returned from xray via ambulation

## 2015-10-08 NOTE — ED Provider Notes (Signed)
[facility] Emergency Department Provider Note    ____________________________________________  Time seen: ~2130  I have reviewed the triage vital signs and the nursing notes.   HISTORY  Chief Complaint Abdominal Pain   History limited by: Not Limited   HPI George Arellano is a 50 y.o. male who presents to the emergency department today because of concerns for upper abdominal pain. He states it is located bilaterally in the upper abdomen. It is moderate. It has been constant for the past couple of days. It started yesterday after he ate a meal. He has not had any vomiting. He has had some nausea. He has eaten since the pain started without necessarily any worsening of the pain. Patient is a large user of alcohol. Denies any fevers.     Past Medical History  Diagnosis Date  . COPD (chronic obstructive pulmonary disease) ([place])   . CHF (congestive heart failure) ([place])   . Hypertension   . Anxiety     Patient Active Problem List   Diagnosis Date Noted  . Community acquired pneumonia 05/05/2015    History reviewed. No pertinent past surgical history.  Current Outpatient Rx  Name  Route  Sig  Dispense  Refill  . albuterol (PROVENTIL HFA;VENTOLIN HFA) 108 (90 BASE) MCG/ACT inhaler   Inhalation   Inhale 2 puffs into the lungs every 6 (six) hours as needed for wheezing or shortness of breath.         Marland Kitchen aspirin 325 MG tablet   Oral   Take 650 mg by mouth every 4 (four) hours as needed for headache.         . budesonide-formoterol (SYMBICORT) 160-4.5 MCG/ACT inhaler   Inhalation   Inhale 2 puffs into the lungs 2 (two) times daily.         . cloNIDine (CATAPRES) 0.1 MG tablet   Oral   Take 0.1 mg by mouth 2 (two) times daily.         . furosemide (LASIX) 20 MG tablet   Oral   Take 20 mg by mouth daily.         Marland Kitchen guaiFENesin (ROBITUSSIN) 100 MG/5ML SOLN   Oral   Take 5 mLs (100 mg total) by mouth every 6 (six) hours as needed for cough  or to loosen phlegm.   1200 mL   0   . Ipratropium-Albuterol (COMBIVENT RESPIMAT) 20-100 MCG/ACT AERS respimat   Inhalation   Inhale 2 puffs into the lungs every 6 (six) hours as needed for wheezing or shortness of breath.         Marland Kitchen ipratropium-albuterol (DUONEB) 0.5-2.5 (3) MG/3ML SOLN   Nebulization   Take 3 mLs by nebulization every 6 (six) hours as needed (for shortness of breath/wheezing).         Marland Kitchen lisinopril (PRINIVIL,ZESTRIL) 2.5 MG tablet   Oral   Take 2.5 mg by mouth 2 (two) times daily.         . metoprolol (LOPRESSOR) 50 MG tablet   Oral   Take 50 mg by mouth 2 (two) times daily.         Marland Kitchen oxyCODONE (OXY IR/ROXICODONE) 5 MG immediate release tablet   Oral   Take 1 tablet (5 mg total) by mouth every 6 (six) hours as needed for moderate pain.   20 tablet   0   . predniSONE (STERAPRED UNI-PAK 21 TAB) 10 MG (21) TBPK tablet      Take 6 tabs first day, 5 tab on  day 2, then 4 on day 3rd, 3 tabs on day 4th , 2 tab on day 5th, and 1 tab on 6th day.   21 tablet   0   . ranitidine (ZANTAC) 150 MG tablet   Oral   Take 150 mg by mouth 2 (two) times daily.           Allergies Levaquin  Family History  Problem Relation Age of Onset  . Hypertension Other     Social History Social History  Substance Use Topics  . Smoking status: Current Every Day Smoker -- 1.00 packs/day    Types: Cigarettes  . Smokeless tobacco: None  . Alcohol Use: 25.2 oz/week    42 Cans of beer per week    Review of Systems  Constitutional: Negative for fever. Cardiovascular: Negative for chest pain. Respiratory: Negative for shortness of breath. Gastrointestinal: Positive for abdominal pain and nausea Neurological: Negative for headaches, focal weakness or numbness.  10-point ROS otherwise negative.  ____________________________________________   PHYSICAL EXAM:  VITAL SIGNS: ED Triage Vitals  Enc Vitals Group     BP 10/08/15 2006 115/78 mmHg     Pulse Rate  10/08/15 2006 98     Resp 10/08/15 2006 18     Temp 10/08/15 2006 100 F (37.8 C)     Temp Source 10/08/15 2006 Oral     SpO2 10/08/15 2006 98 %     Weight 10/08/15 2006 165 lb (74.844 kg)     Height 10/08/15 2006 5\' 8"  (1.727 m)     Head Cir --      Peak Flow --      Pain Score 10/08/15 2009 8   Constitutional: Alert and oriented. Well appearing and in no distress. Eyes: Conjunctivae are normal. PERRL. Normal extraocular movements. ENT   Head: Normocephalic and atraumatic.   Nose: No congestion/rhinnorhea.   Mouth/Throat: Mucous membranes are moist.   Neck: No stridor. Hematological/Lymphatic/Immunilogical: No cervical lymphadenopathy. Cardiovascular: Normal rate, regular rhythm.  No murmurs, rubs, or gallops. Respiratory: Normal respiratory effort without tachypnea nor retractions. Breath sounds are clear and equal bilaterally. No wheezes/rales/rhonchi. Gastrointestinal: Soft and minimally tender to palpation of the upper abdomen Genitourinary: Deferred Musculoskeletal: Normal range of motion in all extremities. No joint effusions.  No lower extremity tenderness nor edema. Neurologic:  Normal speech and language. No gross focal neurologic deficits are appreciated.  Skin:  Skin is warm, dry and intact. No rash noted. Psychiatric: Mood and affect are normal. Speech and behavior are normal. Patient exhibits appropriate insight and judgment.  ____________________________________________    LABS (pertinent positives/negatives)  Labs Reviewed  COMPREHENSIVE METABOLIC PANEL - Abnormal; Notable for the following:    Sodium 131 (*)    Potassium 3.2 (*)    Chloride 95 (*)    Glucose, Bld 114 (*)    AST 50 (*)    All other components within normal limits  CBC - Abnormal; Notable for the following:    RBC 3.80 (*)    HCT 37.7 (*)    MCH 35.1 (*)    All other components within normal limits  LIPASE, BLOOD  URINALYSIS COMPLETEWITH MICROSCOPIC ([facility] ONLY)      ____________________________________________   EKG  I, Nance Pear, attending physician, personally viewed and interpreted this EKG  EKG Time: 2009 Rate: 95 Rhythm: normal sinus rhythm Axis: normal Intervals: qtc 444 QRS: narrow ST changes: no st elevation Impression: normal ekg ____________________________________________    RADIOLOGY  Abd x-ray IMPRESSION: 1. Nonobstructive bowel gas  pattern with no radiographic evidence for acute intra-abdominal process. 2. No active cardiopulmonary disease.  [place] RUQ pending  ____________________________________________   PROCEDURES  Procedure(s) performed: None  Critical Care performed: No  ____________________________________________   INITIAL IMPRESSION / ASSESSMENT AND PLAN / ED COURSE  Pertinent labs & imaging results that were available during my care of the patient were reviewed by me and considered in my medical decision making (see chart for details).  Patient presented to the emergency department today because of concerns for upper abdominal pain. No leukocytosis on blood work. On exam patient did have some mild upper abdominal tenderness. Patient did have some mild improvement after a GI cocktail. However will obtain ultrasound to evaluate for any gallstones. Acute abdominal x-ray series without any concerning findings.  ____________________________________________   FINAL CLINICAL IMPRESSION(S) / ED DIAGNOSES  Final diagnoses:  Epigastric abdominal pain     Nance Pear, MD 10/09/15 1541

## 2015-10-08 NOTE — ED Notes (Signed)
Pt to ED with generalized abd pain, more so bilateral upper quads. Pt denies n/v/d fever or chills. Pt states 1 BM in the last 5 days, unusual per pt. Pt denies SOB or chest pain. Vitals stable at this time. NAD noted.

## 2015-10-08 NOTE — ED Notes (Signed)
Pt ambulatory to xray.

## 2015-10-09 NOTE — ED Provider Notes (Signed)
Assumed care of the patient 11:00 PM from Dr. Archie Balboa with ultrasound the abdomen pending which revealed:   Imaging Results       US Abdomen Limited RUQ (Final result) Result time: 10/08/15 23:29:57   Final result by Rad Results In Interface (10/08/15 23:29:57)   Narrative:   CLINICAL DATA: 50 year old male with epigastric pain  EXAM: US ABDOMEN LIMITED - RIGHT UPPER QUADRANT  COMPARISON: Ultrasound dated 05/24/2013  FINDINGS: Gallbladder:  No gallstones or wall thickening visualized. No sonographic Murphy sign noted by sonographer.  Common bile duct:  Diameter: 5 mm  Liver:  There is diffuse hepatic echogenicity compatible with fatty infiltration.  IMPRESSION: Fatty liver.  No gallstones.   Electronically Signed By: Anner Crete M.D. On: 10/08/2015 23:29          DG Abd Acute W/Chest (Final result) Result time: 10/08/15 21:58:20   Final result by Rad Results In Interface (10/08/15 21:58:20)   Narrative:   CLINICAL DATA: Initial evaluation for acute upper abdominal pain.  EXAM: DG ABDOMEN ACUTE W/ 1V CHEST  COMPARISON: Prior study from 05/05/2015.  FINDINGS: Cardiac and mediastinal silhouette stable in size and contour, and remain within normal limits. Mild atheromatous plaque within the aortic arch.  Lungs are normally inflated. No focal infiltrate, pulmonary edema, or pleural effusion. No pneumothorax.  Bowel gas pattern within normal limits without evidence for obstruction or ileus. No abnormal bowel wall thickening. No free air. No soft tissue mass or abnormal calcification. Scattered vascular calcifications noted. No acute osseous abnormality.  IMPRESSION: 1. Nonobstructive bowel gas pattern with no radiographic evidence for acute intra-abdominal process. 2. No active cardiopulmonary disease.   Electronically Signed By: Jeannine Boga M.D. On: 10/08/2015 21:58   Patient was discharged home with outpatient  follow-up as recommended by Dr. Christiana Fuchs, MD 10/09/15 [id number]

## 2015-10-09 NOTE — ED Notes (Signed)
Pt discharged by Modena Nunnery, RN

## 2016-04-03 ENCOUNTER — Encounter: Payer: Self-pay | Admitting: *Deleted

## 2016-04-03 ENCOUNTER — Emergency Department
Admission: EM | Admit: 2016-04-03 | Discharge: 2016-04-03 | Disposition: A | Discharge status: Home / Self Care | Payer: Medicaid Other | Attending: Emergency Medicine | Admitting: Emergency Medicine

## 2016-04-03 DIAGNOSIS — Z01818 Encounter for other preprocedural examination: Secondary | ICD-10-CM | POA: Diagnosis present

## 2016-04-03 DIAGNOSIS — Z7982 Long term (current) use of aspirin: Secondary | ICD-10-CM | POA: Insufficient documentation

## 2016-04-03 DIAGNOSIS — I110 Hypertensive heart disease with heart failure: Secondary | ICD-10-CM | POA: Diagnosis not present

## 2016-04-03 DIAGNOSIS — F1010 Alcohol abuse, uncomplicated: Secondary | ICD-10-CM | POA: Diagnosis not present

## 2016-04-03 DIAGNOSIS — Z79899 Other long term (current) drug therapy: Secondary | ICD-10-CM | POA: Insufficient documentation

## 2016-04-03 DIAGNOSIS — I509 Heart failure, unspecified: Secondary | ICD-10-CM | POA: Diagnosis not present

## 2016-04-03 DIAGNOSIS — F17210 Nicotine dependence, cigarettes, uncomplicated: Secondary | ICD-10-CM | POA: Diagnosis not present

## 2016-04-03 DIAGNOSIS — J449 Chronic obstructive pulmonary disease, unspecified: Secondary | ICD-10-CM | POA: Insufficient documentation

## 2016-04-03 NOTE — ED Notes (Signed)
Pt verbalized understanding of discharge instructions. NAD at this time. Pt is clear for [facility] to pick up. Pt placed in lobby for pick up. Pt is not intoxicated and CIWA is a 2.

## 2016-04-03 NOTE — ED Provider Notes (Signed)
[facility] Emergency Department Provider Note   ____________________________________________   I have reviewed the triage vital signs and the nursing notes.   HISTORY  Chief Complaint Medical Clearance   History limited by: Not Limited   HPI George Arellano is a 50 y.o. male with longstanding history of alcohol abuse who presents to the emergency department today at the request of [facility] for medical clearance prior to alcohol detox. The patient denies any current medical complaints. He denies ever having seizures when he has stopped drinking.     Past Medical History:  Diagnosis Date  . Anxiety   . CHF (congestive heart failure) ([place])   . COPD (chronic obstructive pulmonary disease) ([place])   . Hypertension     Patient Active Problem List   Diagnosis Date Noted  . Community acquired pneumonia 05/05/2015    History reviewed. No pertinent surgical history.  Prior to Admission medications   Medication Sig Start Date End Date Taking? Authorizing Provider  albuterol (PROVENTIL HFA;VENTOLIN HFA) 108 (90 BASE) MCG/ACT inhaler Inhale 2 puffs into the lungs every 6 (six) hours as needed for wheezing or shortness of breath.    Historical Provider, MD  aspirin 325 MG tablet Take 650 mg by mouth every 4 (four) hours as needed for headache.    Historical Provider, MD  budesonide-formoterol (SYMBICORT) 160-4.5 MCG/ACT inhaler Inhale 2 puffs into the lungs 2 (two) times daily.    Historical Provider, MD  cloNIDine (CATAPRES) 0.1 MG tablet Take 0.1 mg by mouth 2 (two) times daily.    Historical Provider, MD  dicyclomine (BENTYL) 20 MG tablet Take 1 tablet (20 mg total) by mouth 3 (three) times daily as needed (abdominal pain). 10/08/15   Nance Pear, MD  furosemide (LASIX) 20 MG tablet Take 20 mg by mouth daily.    Historical Provider, MD  guaiFENesin (ROBITUSSIN) 100 MG/5ML SOLN Take 5 mLs (100 mg total) by mouth every 6 (six) hours as needed for cough or to loosen  phlegm. 05/07/15   Vaughan Basta, MD  Ipratropium-Albuterol (COMBIVENT RESPIMAT) 20-100 MCG/ACT AERS respimat Inhale 2 puffs into the lungs every 6 (six) hours as needed for wheezing or shortness of breath.    Historical Provider, MD  ipratropium-albuterol (DUONEB) 0.5-2.5 (3) MG/3ML SOLN Take 3 mLs by nebulization every 6 (six) hours as needed (for shortness of breath/wheezing).    Historical Provider, MD  lisinopril (PRINIVIL,ZESTRIL) 2.5 MG tablet Take 2.5 mg by mouth 2 (two) times daily.    Historical Provider, MD  metoprolol (LOPRESSOR) 50 MG tablet Take 50 mg by mouth 2 (two) times daily.    Historical Provider, MD  oxyCODONE (OXY IR/ROXICODONE) 5 MG immediate release tablet Take 1 tablet (5 mg total) by mouth every 6 (six) hours as needed for moderate pain. 05/07/15   Vaughan Basta, MD  predniSONE (STERAPRED UNI-PAK 21 TAB) 10 MG (21) TBPK tablet Take 6 tabs first day, 5 tab on day 2, then 4 on day 3rd, 3 tabs on day 4th , 2 tab on day 5th, and 1 tab on 6th day. 05/07/15   Vaughan Basta, MD  ranitidine (ZANTAC) 150 MG tablet Take 150 mg by mouth 2 (two) times daily.    Historical Provider, MD  sucralfate (CARAFATE) 1 g tablet Take 1 tablet (1 g total) by mouth 4 (four) times daily. 10/08/15   Nance Pear, MD    Allergies Levaquin [levofloxacin]  Family History  Problem Relation Age of Onset  . Hypertension Other  Social History Social History  Substance Use Topics  . Smoking status: Current Every Day Smoker    Packs/day: 1.00    Types: Cigarettes  . Smokeless tobacco: Not on file  . Alcohol use 25.2 oz/week    42 Cans of beer per week    Review of Systems  Constitutional: Negative for fever. Cardiovascular: Negative for chest pain. Respiratory: Negative for shortness of breath. Gastrointestinal: Negative for abdominal pain, vomiting and diarrhea. Genitourinary: Negative for dysuria. Musculoskeletal: Negative for back pain. Skin: Negative for  rash. Neurological: Negative for headaches, focal weakness or numbness.  10-point ROS otherwise negative.  ____________________________________________   PHYSICAL EXAM:  VITAL SIGNS: ED Triage Vitals [04/03/16 1402]  Enc Vitals Group     BP (!) 150/97     Pulse Rate (!) 118     Resp 20     Temp 98.5 F (36.9 C)     Temp Source Oral     SpO2 100 %     Weight 150 lb (68 kg)     Height 5\' 8"  (1.727 m)   Constitutional: Alert and oriented. Well appearing and in no distress. Eyes: Conjunctivae are normal. Normal extraocular movements. ENT   Head: Normocephalic and atraumatic.   Nose: No congestion/rhinnorhea.   Mouth/Throat: Mucous membranes are moist.   Neck: No stridor. Hematological/Lymphatic/Immunilogical: No cervical lymphadenopathy. Cardiovascular: Normal rate, regular rhythm.  No murmurs, rubs, or gallops. Respiratory: Normal respiratory effort without tachypnea nor retractions. Breath sounds are clear and equal bilaterally. No wheezes/rales/rhonchi. Gastrointestinal: Soft and nontender. No distention.  Genitourinary: Deferred Musculoskeletal: Normal range of motion in all extremities. No lower extremity edema. Neurologic:  Normal speech and language. No gross focal neurologic deficits are appreciated.  Skin:  Skin is warm, dry and intact. No rash noted. Psychiatric: Mood and affect are normal. Speech and behavior are normal. Patient exhibits appropriate insight and judgment.  ____________________________________________    LABS (pertinent positives/negatives)  None  ____________________________________________   EKG  None  ____________________________________________    RADIOLOGY  None  ____________________________________________   PROCEDURES  Procedures  ____________________________________________   INITIAL IMPRESSION / ASSESSMENT AND PLAN / ED COURSE  Pertinent labs & imaging results that were available during my care of the  patient were reviewed by me and considered in my medical decision making (see chart for details).  Patient presents to the emergency department today at the request of [facility] for medical clearance for alcohol detox. Patient is medically cleared for alcohol detox ____________________________________________   FINAL CLINICAL IMPRESSION(S) / ED DIAGNOSES  Final diagnoses:  Alcohol abuse     Note: This dictation was prepared with Dragon dictation. Any transcriptional errors that result from this process are unintentional   Nance Pear, MD 04/03/16 [phone]

## 2016-04-03 NOTE — ED Triage Notes (Signed)
Pt is here for medical clearance for [facility], pt drinks 12 beers daily, pt smokes [place]

## 2016-04-03 NOTE — Discharge Instructions (Addendum)
Patient is clear for alcohol detox. Please seek medical attention for any high fevers, chest pain, shortness of breath, change in behavior, persistent vomiting, bloody stool or any other new or concerning symptoms.

## 2016-04-13 DIAGNOSIS — H4920 Sixth [abducent] nerve palsy, unspecified eye: Secondary | ICD-10-CM | POA: Insufficient documentation

## 2016-08-16 DIAGNOSIS — F1090 Alcohol use, unspecified, uncomplicated: Secondary | ICD-10-CM | POA: Insufficient documentation

## 2016-08-16 DIAGNOSIS — F1020 Alcohol dependence, uncomplicated: Secondary | ICD-10-CM | POA: Insufficient documentation

## 2016-08-16 DIAGNOSIS — H5509 Other forms of nystagmus: Secondary | ICD-10-CM | POA: Insufficient documentation

## 2017-07-12 DIAGNOSIS — I639 Cerebral infarction, unspecified: Secondary | ICD-10-CM

## 2017-07-12 HISTORY — DX: Cerebral infarction, unspecified: I63.9

## 2017-07-18 ENCOUNTER — Other Ambulatory Visit: Payer: Self-pay

## 2017-07-18 ENCOUNTER — Emergency Department: Payer: Medicaid Other

## 2017-07-18 ENCOUNTER — Inpatient Hospital Stay
Admission: EM | Admit: 2017-07-18 | Discharge: 2017-08-01 | DRG: 871 | Disposition: A | Discharge status: Home Health | Payer: Medicaid Other | Attending: Internal Medicine | Admitting: Internal Medicine

## 2017-07-18 DIAGNOSIS — Z8249 Family history of ischemic heart disease and other diseases of the circulatory system: Secondary | ICD-10-CM

## 2017-07-18 DIAGNOSIS — E870 Hyperosmolality and hypernatremia: Secondary | ICD-10-CM | POA: Diagnosis not present

## 2017-07-18 DIAGNOSIS — E874 Mixed disorder of acid-base balance: Secondary | ICD-10-CM | POA: Diagnosis present

## 2017-07-18 DIAGNOSIS — A419 Sepsis, unspecified organism: Principal | ICD-10-CM

## 2017-07-18 DIAGNOSIS — J441 Chronic obstructive pulmonary disease with (acute) exacerbation: Secondary | ICD-10-CM | POA: Diagnosis present

## 2017-07-18 DIAGNOSIS — F10239 Alcohol dependence with withdrawal, unspecified: Secondary | ICD-10-CM | POA: Diagnosis present

## 2017-07-18 DIAGNOSIS — R0902 Hypoxemia: Secondary | ICD-10-CM | POA: Diagnosis present

## 2017-07-18 DIAGNOSIS — J9602 Acute respiratory failure with hypercapnia: Secondary | ICD-10-CM | POA: Diagnosis present

## 2017-07-18 DIAGNOSIS — E876 Hypokalemia: Secondary | ICD-10-CM | POA: Diagnosis present

## 2017-07-18 DIAGNOSIS — G9341 Metabolic encephalopathy: Secondary | ICD-10-CM | POA: Diagnosis not present

## 2017-07-18 DIAGNOSIS — Z9981 Dependence on supplemental oxygen: Secondary | ICD-10-CM

## 2017-07-18 DIAGNOSIS — K7030 Alcoholic cirrhosis of liver without ascites: Secondary | ICD-10-CM | POA: Diagnosis present

## 2017-07-18 DIAGNOSIS — K219 Gastro-esophageal reflux disease without esophagitis: Secondary | ICD-10-CM | POA: Diagnosis present

## 2017-07-18 DIAGNOSIS — R6521 Severe sepsis with septic shock: Secondary | ICD-10-CM | POA: Diagnosis present

## 2017-07-18 DIAGNOSIS — F17210 Nicotine dependence, cigarettes, uncomplicated: Secondary | ICD-10-CM | POA: Diagnosis present

## 2017-07-18 DIAGNOSIS — Z1389 Encounter for screening for other disorder: Secondary | ICD-10-CM

## 2017-07-18 DIAGNOSIS — J690 Pneumonitis due to inhalation of food and vomit: Secondary | ICD-10-CM | POA: Diagnosis present

## 2017-07-18 DIAGNOSIS — M6282 Rhabdomyolysis: Secondary | ICD-10-CM | POA: Diagnosis present

## 2017-07-18 DIAGNOSIS — F10229 Alcohol dependence with intoxication, unspecified: Secondary | ICD-10-CM | POA: Diagnosis present

## 2017-07-18 DIAGNOSIS — Z0189 Encounter for other specified special examinations: Secondary | ICD-10-CM

## 2017-07-18 DIAGNOSIS — J9690 Respiratory failure, unspecified, unspecified whether with hypoxia or hypercapnia: Secondary | ICD-10-CM

## 2017-07-18 DIAGNOSIS — Z7982 Long term (current) use of aspirin: Secondary | ICD-10-CM | POA: Diagnosis not present

## 2017-07-18 DIAGNOSIS — N170 Acute kidney failure with tubular necrosis: Secondary | ICD-10-CM | POA: Diagnosis present

## 2017-07-18 DIAGNOSIS — J189 Pneumonia, unspecified organism: Secondary | ICD-10-CM

## 2017-07-18 DIAGNOSIS — I509 Heart failure, unspecified: Secondary | ICD-10-CM | POA: Diagnosis present

## 2017-07-18 DIAGNOSIS — I63531 Cerebral infarction due to unspecified occlusion or stenosis of right posterior cerebral artery: Secondary | ICD-10-CM | POA: Diagnosis present

## 2017-07-18 DIAGNOSIS — I6350 Cerebral infarction due to unspecified occlusion or stenosis of unspecified cerebral artery: Secondary | ICD-10-CM | POA: Diagnosis not present

## 2017-07-18 DIAGNOSIS — J9601 Acute respiratory failure with hypoxia: Secondary | ICD-10-CM | POA: Diagnosis present

## 2017-07-18 DIAGNOSIS — I110 Hypertensive heart disease with heart failure: Secondary | ICD-10-CM | POA: Diagnosis present

## 2017-07-18 DIAGNOSIS — I639 Cerebral infarction, unspecified: Secondary | ICD-10-CM

## 2017-07-18 DIAGNOSIS — Z7951 Long term (current) use of inhaled steroids: Secondary | ICD-10-CM | POA: Diagnosis not present

## 2017-07-18 LAB — CK
CK TOTAL: 4134 U/L — AB (ref 49–397)
Total CK: 2635 U/L — ABNORMAL HIGH (ref 49–397)

## 2017-07-18 LAB — MRSA PCR SCREENING: MRSA BY PCR: NEGATIVE

## 2017-07-18 LAB — BLOOD GAS, VENOUS
Acid-base deficit: 16.6 mmol/L — ABNORMAL HIGH (ref 0.0–2.0)
BICARBONATE: 15 mmol/L — AB (ref 20.0–28.0)
O2 Saturation: 37.9 %
PATIENT TEMPERATURE: 37
PH VEN: 7.02 — AB (ref 7.250–7.430)
PO2 VEN: 35 mmHg (ref 32.0–45.0)
pCO2, Ven: 58 mmHg (ref 44.0–60.0)

## 2017-07-18 LAB — BLOOD GAS, ARTERIAL
ACID-BASE DEFICIT: 5.9 mmol/L — AB (ref 0.0–2.0)
Acid-base deficit: 5.2 mmol/L — ABNORMAL HIGH (ref 0.0–2.0)
BICARBONATE: 20.2 mmol/L (ref 20.0–28.0)
Bicarbonate: 20 mmol/L (ref 20.0–28.0)
DELIVERY SYSTEMS: POSITIVE
Delivery systems: POSITIVE
EXPIRATORY PAP: 6
Expiratory PAP: 6
FIO2: 0.6
FIO2: 0.6
INSPIRATORY PAP: 12
Inspiratory PAP: 12
Mechanical Rate: 8
O2 Saturation: 96.3 %
O2 Saturation: 95.4 %
PO2 ART: 86 mmHg (ref 83.0–108.0)
Patient temperature: 37
Patient temperature: 37
pCO2 arterial: 37 mmHg (ref 32.0–48.0)
pCO2 arterial: 41 mmHg (ref 32.0–48.0)
pH, Arterial: 7.34 — ABNORMAL LOW (ref 7.350–7.450)
pH, Arterial: 7.3 — ABNORMAL LOW (ref 7.350–7.450)
pO2, Arterial: 89 mmHg (ref 83.0–108.0)

## 2017-07-18 LAB — URINALYSIS, ROUTINE W REFLEX MICROSCOPIC
BILIRUBIN URINE: NEGATIVE
Glucose, UA: NEGATIVE mg/dL
KETONES UR: NEGATIVE mg/dL
Nitrite: NEGATIVE
PROTEIN: 30 mg/dL — AB
Specific Gravity, Urine: 1.011 (ref 1.005–1.030)
pH: 5 (ref 5.0–8.0)

## 2017-07-18 LAB — LACTIC ACID, PLASMA
LACTIC ACID, VENOUS: 9.8 mmol/L — AB (ref 0.5–1.9)
Lactic Acid, Venous: 3.2 mmol/L (ref 0.5–1.9)

## 2017-07-18 LAB — COMPREHENSIVE METABOLIC PANEL
ALBUMIN: 1.3 g/dL — AB (ref 3.5–5.0)
ALBUMIN: 2.5 g/dL — AB (ref 3.5–5.0)
ALT: 20 U/L (ref 17–63)
ALT: 37 U/L (ref 17–63)
ANION GAP: 19 — AB (ref 5–15)
AST: 51 U/L — ABNORMAL HIGH (ref 15–41)
AST: 109 U/L — ABNORMAL HIGH (ref 15–41)
Alkaline Phosphatase: 18 U/L — ABNORMAL LOW (ref 38–126)
Alkaline Phosphatase: 30 U/L — ABNORMAL LOW (ref 38–126)
Anion gap: 12 (ref 5–15)
BILIRUBIN TOTAL: 0.6 mg/dL (ref 0.3–1.2)
BUN: 17 mg/dL (ref 6–20)
BUN: 34 mg/dL — ABNORMAL HIGH (ref 6–20)
CHLORIDE: 127 mmol/L — AB (ref 101–111)
CO2: 8 mmol/L — ABNORMAL LOW (ref 22–32)
CO2: 17 mmol/L — AB (ref 22–32)
CREATININE: 2.03 mg/dL — AB (ref 0.61–1.24)
Calcium: <4 mg/dL — CL (ref 8.9–10.3)
Calcium: 7.3 mg/dL — ABNORMAL LOW (ref 8.9–10.3)
Chloride: 101 mmol/L (ref 101–111)
Creatinine, Ser: 5.23 mg/dL — ABNORMAL HIGH (ref 0.61–1.24)
GFR calc Af Amer: 42 mL/min — ABNORMAL LOW (ref >60)
GFR calc non Af Amer: 12 mL/min — ABNORMAL LOW (ref >60)
GFR, EST AFRICAN AMERICAN: 13 mL/min — AB (ref >60)
GFR, EST NON AFRICAN AMERICAN: 37 mL/min — AB (ref >60)
GLUCOSE: 164 mg/dL — AB (ref 65–99)
Glucose, Bld: 40 mg/dL — CL (ref 65–99)
POTASSIUM: 5.1 mmol/L (ref 3.5–5.1)
Potassium: <2 mmol/L — CL (ref 3.5–5.1)
SODIUM: 137 mmol/L (ref 135–145)
Sodium: 147 mmol/L — ABNORMAL HIGH (ref 135–145)
Total Bilirubin: 1.2 mg/dL (ref 0.3–1.2)
Total Protein: <3 g/dL — ABNORMAL LOW (ref 6.5–8.1)
Total Protein: 5.1 g/dL — ABNORMAL LOW (ref 6.5–8.1)

## 2017-07-18 LAB — BASIC METABOLIC PANEL
Anion gap: 18 — ABNORMAL HIGH (ref 5–15)
BUN: 45 mg/dL — ABNORMAL HIGH (ref 6–20)
CALCIUM: 7.3 mg/dL — AB (ref 8.9–10.3)
CO2: 19 mmol/L — ABNORMAL LOW (ref 22–32)
CREATININE: 5.25 mg/dL — AB (ref 0.61–1.24)
Chloride: 100 mmol/L — ABNORMAL LOW (ref 101–111)
GFR, EST AFRICAN AMERICAN: 13 mL/min — AB (ref >60)
GFR, EST NON AFRICAN AMERICAN: 12 mL/min — AB (ref >60)
Glucose, Bld: 86 mg/dL (ref 65–99)
Potassium: 5.8 mmol/L — ABNORMAL HIGH (ref 3.5–5.1)
SODIUM: 137 mmol/L (ref 135–145)

## 2017-07-18 LAB — LIPASE, BLOOD: Lipase: 14 U/L (ref 11–51)

## 2017-07-18 LAB — GLUCOSE, CAPILLARY
GLUCOSE-CAPILLARY: 140 mg/dL — AB (ref 65–99)
Glucose-Capillary: 71 mg/dL (ref 65–99)
Glucose-Capillary: 174 mg/dL — ABNORMAL HIGH (ref 65–99)

## 2017-07-18 LAB — CBC
HEMATOCRIT: 46.1 % (ref 40.0–52.0)
HEMOGLOBIN: 15.7 g/dL (ref 13.0–18.0)
MCH: 36 pg — AB (ref 26.0–34.0)
MCHC: 34 g/dL (ref 32.0–36.0)
MCV: 106.1 fL — ABNORMAL HIGH (ref 80.0–100.0)
Platelets: 166 10*3/uL (ref 150–440)
RBC: 4.35 MIL/uL — AB (ref 4.40–5.90)
RDW: 12.7 % (ref 11.5–14.5)
WBC: 16.9 10*3/uL — ABNORMAL HIGH (ref 3.8–10.6)

## 2017-07-18 LAB — MAGNESIUM
Magnesium: 1.4 mg/dL — ABNORMAL LOW (ref 1.7–2.4)
Magnesium: 2.5 mg/dL — ABNORMAL HIGH (ref 1.7–2.4)

## 2017-07-18 LAB — PHOSPHORUS: PHOSPHORUS: 8.2 mg/dL — AB (ref 2.5–4.6)

## 2017-07-18 LAB — TROPONIN I: Troponin I: <0.03 ng/mL (ref <0.03)

## 2017-07-18 MED ORDER — SODIUM CHLORIDE 0.9 % IV SOLN
1.0000 g | Freq: Once | INTRAVENOUS | Status: AC
  Administered 2017-07-18: 1 g via INTRAVENOUS
  Filled 2017-07-18: qty 10

## 2017-07-18 MED ORDER — DEXTROSE 50 % IV SOLN
25.0000 mL | Freq: Once | INTRAVENOUS | Status: AC
  Administered 2017-07-18: 25 mL via INTRAVENOUS
  Filled 2017-07-18: qty 50

## 2017-07-18 MED ORDER — SODIUM CHLORIDE 0.9 % IV BOLUS (SEPSIS)
1000.0000 mL | Freq: Once | INTRAVENOUS | Status: AC
  Administered 2017-07-18: 1000 mL via INTRAVENOUS

## 2017-07-18 MED ORDER — POTASSIUM CHLORIDE 10 MEQ/100ML IV SOLN
10.0000 meq | INTRAVENOUS | Status: AC
  Administered 2017-07-18 (×2): 10 meq via INTRAVENOUS
  Filled 2017-07-18 (×2): qty 100

## 2017-07-18 MED ORDER — CHLORHEXIDINE GLUCONATE 0.12 % MT SOLN
15.0000 mL | Freq: Two times a day (BID) | OROMUCOSAL | Status: DC
  Administered 2017-07-18 – 2017-07-27 (×18): 15 mL via OROMUCOSAL
  Filled 2017-07-18 (×18): qty 15

## 2017-07-18 MED ORDER — ONDANSETRON HCL 4 MG/2ML IJ SOLN
4.0000 mg | Freq: Four times a day (QID) | INTRAMUSCULAR | Status: DC | PRN
Start: 2017-07-18 — End: 2017-08-01
  Administered 2017-07-25 – 2017-07-26 (×6): 4 mg via INTRAVENOUS
  Filled 2017-07-18 (×6): qty 2

## 2017-07-18 MED ORDER — ORAL CARE MOUTH RINSE
15.0000 mL | Freq: Two times a day (BID) | OROMUCOSAL | Status: DC
  Administered 2017-07-19 – 2017-07-26 (×13): 15 mL via OROMUCOSAL

## 2017-07-18 MED ORDER — DEXTROSE 5 % IV SOLN
0.0000 ug/min | INTRAVENOUS | Status: DC
  Filled 2017-07-18: qty 4

## 2017-07-18 MED ORDER — MAGNESIUM SULFATE 2 GM/50ML IV SOLN
2.0000 g | Freq: Once | INTRAVENOUS | Status: AC
  Administered 2017-07-18: 2 g via INTRAVENOUS
  Filled 2017-07-18: qty 50

## 2017-07-18 MED ORDER — SODIUM CHLORIDE 0.9 % IV SOLN
0.0000 ug/min | INTRAVENOUS | Status: DC
  Administered 2017-07-18 – 2017-07-19 (×2): 20 ug/min via INTRAVENOUS
  Administered 2017-07-19: 24 ug/min via INTRAVENOUS
  Administered 2017-07-19: 30 ug/min via INTRAVENOUS
  Administered 2017-07-20: 25 ug/min via INTRAVENOUS
  Filled 2017-07-18: qty 40
  Filled 2017-07-18: qty 4
  Filled 2017-07-18: qty 40
  Filled 2017-07-18 (×2): qty 4
  Filled 2017-07-18: qty 40
  Filled 2017-07-18: qty 4

## 2017-07-18 MED ORDER — PIPERACILLIN-TAZOBACTAM 3.375 G IVPB 30 MIN
3.3750 g | Freq: Once | INTRAVENOUS | Status: AC
  Administered 2017-07-18: 3.375 g via INTRAVENOUS
  Filled 2017-07-18: qty 50

## 2017-07-18 MED ORDER — DEXMEDETOMIDINE HCL IN NACL 400 MCG/100ML IV SOLN
0.4000 ug/kg/h | INTRAVENOUS | Status: DC
  Administered 2017-07-18: 0.6 ug/kg/h via INTRAVENOUS
  Administered 2017-07-18 – 2017-07-19 (×2): 0.2 ug/kg/h via INTRAVENOUS
  Administered 2017-07-20: 2 ug/kg/h via INTRAVENOUS
  Administered 2017-07-20: 0.6 ug/kg/h via INTRAVENOUS
  Administered 2017-07-21: 1.2 ug/kg/h via INTRAVENOUS
  Administered 2017-07-21: 1.6 ug/kg/h via INTRAVENOUS
  Administered 2017-07-21: 1.8 ug/kg/h via INTRAVENOUS
  Administered 2017-07-21: 1.2 ug/kg/h via INTRAVENOUS
  Administered 2017-07-21: 1.8 ug/kg/h via INTRAVENOUS
  Administered 2017-07-21: 1.9 ug/kg/h via INTRAVENOUS
  Administered 2017-07-21: 2 ug/kg/h via INTRAVENOUS
  Administered 2017-07-22: 1.2 ug/kg/h via INTRAVENOUS
  Administered 2017-07-22: 1 ug/kg/h via INTRAVENOUS
  Administered 2017-07-22 – 2017-07-24 (×13): 1.2 ug/kg/h via INTRAVENOUS
  Administered 2017-07-25: 1 ug/kg/h via INTRAVENOUS
  Filled 2017-07-18 (×30): qty 100

## 2017-07-18 MED ORDER — METHYLPREDNISOLONE SODIUM SUCC 125 MG IJ SOLR
60.0000 mg | INTRAMUSCULAR | Status: DC
  Administered 2017-07-18 – 2017-07-19 (×2): 60 mg via INTRAVENOUS
  Filled 2017-07-18 (×2): qty 2

## 2017-07-18 MED ORDER — SODIUM CHLORIDE 0.9 % IV SOLN
500.0000 mg | Freq: Once | INTRAVENOUS | Status: AC
  Administered 2017-07-18: 500 mg via INTRAVENOUS
  Filled 2017-07-18: qty 500

## 2017-07-18 MED ORDER — ENOXAPARIN SODIUM 30 MG/0.3ML ~~LOC~~ SOLN
30.0000 mg | SUBCUTANEOUS | Status: DC
  Administered 2017-07-18: 30 mg via SUBCUTANEOUS
  Filled 2017-07-18: qty 0.3

## 2017-07-18 MED ORDER — DEXTROSE 50 % IV SOLN
25.0000 g | Freq: Once | INTRAVENOUS | Status: AC
  Administered 2017-07-18: 25 g via INTRAVENOUS

## 2017-07-18 MED ORDER — FOLIC ACID 5 MG/ML IJ SOLN
1.0000 mg | Freq: Every day | INTRAMUSCULAR | Status: DC
  Administered 2017-07-18 – 2017-07-27 (×10): 1 mg via INTRAVENOUS
  Filled 2017-07-18 (×20): qty 0.2

## 2017-07-18 MED ORDER — STERILE WATER FOR INJECTION IV SOLN
INTRAVENOUS | Status: DC
  Administered 2017-07-18 (×2): via INTRAVENOUS
  Filled 2017-07-18 (×3): qty 850

## 2017-07-18 MED ORDER — DEXTROSE 50 % IV SOLN
INTRAVENOUS | Status: AC
  Filled 2017-07-18: qty 50

## 2017-07-18 MED ORDER — SODIUM CHLORIDE 0.9 % IV SOLN
0.0000 ug/min | INTRAVENOUS | Status: DC
  Administered 2017-07-18: 20 ug/min via INTRAVENOUS
  Filled 2017-07-18: qty 10

## 2017-07-18 MED ORDER — SODIUM CHLORIDE 0.9% FLUSH: 10.0000 mL | INTRAVENOUS | Status: DC | PRN

## 2017-07-18 MED ORDER — LEVALBUTEROL HCL 0.63 MG/3ML IN NEBU
0.6300 mg | INHALATION_SOLUTION | Freq: Four times a day (QID) | RESPIRATORY_TRACT | Status: DC
  Administered 2017-07-18 – 2017-07-28 (×40): 0.63 mg via RESPIRATORY_TRACT
  Filled 2017-07-18 (×40): qty 3

## 2017-07-18 MED ORDER — HEPARIN SODIUM (PORCINE) 5000 UNIT/ML IJ SOLN
5000.0000 [IU] | Freq: Three times a day (TID) | INTRAMUSCULAR | Status: DC
  Administered 2017-07-19 – 2017-07-22 (×10): 5000 [IU] via SUBCUTANEOUS
  Filled 2017-07-18 (×10): qty 1

## 2017-07-18 MED ORDER — ACETAMINOPHEN 325 MG PO TABS: 650.0000 mg | ORAL_TABLET | Freq: Four times a day (QID) | ORAL | Status: DC | PRN

## 2017-07-18 MED ORDER — ACETAMINOPHEN 650 MG RE SUPP: 650.0000 mg | Freq: Four times a day (QID) | RECTAL | Status: DC | PRN

## 2017-07-18 MED ORDER — PIPERACILLIN-TAZOBACTAM 3.375 G IVPB
3.3750 g | Freq: Two times a day (BID) | INTRAVENOUS | Status: DC
  Administered 2017-07-18 – 2017-07-21 (×6): 3.375 g via INTRAVENOUS
  Filled 2017-07-18 (×6): qty 50

## 2017-07-18 MED ORDER — LEVALBUTEROL HCL 1.25 MG/0.5ML IN NEBU
INHALATION_SOLUTION | RESPIRATORY_TRACT | Status: AC
  Filled 2017-07-18: qty 0.5

## 2017-07-18 MED ORDER — HYDROCODONE-ACETAMINOPHEN 5-325 MG PO TABS
1.0000 | ORAL_TABLET | ORAL | Status: DC | PRN
  Administered 2017-07-19: 2 via ORAL
  Administered 2017-07-30 (×2): 1 via ORAL
  Administered 2017-07-30 – 2017-07-31 (×3): 2 via ORAL
  Filled 2017-07-18: qty 1
  Filled 2017-07-18 (×2): qty 2
  Filled 2017-07-18: qty 1
  Filled 2017-07-18 (×2): qty 2

## 2017-07-18 MED ORDER — SODIUM BICARBONATE 8.4 % IV SOLN
INTRAVENOUS | Status: DC
  Administered 2017-07-18 – 2017-07-19 (×4): via INTRAVENOUS
  Filled 2017-07-18 (×11): qty 150

## 2017-07-18 MED ORDER — ONDANSETRON HCL 4 MG PO TABS: 4.0000 mg | ORAL_TABLET | Freq: Four times a day (QID) | ORAL | Status: DC | PRN

## 2017-07-18 MED ORDER — THIAMINE HCL 100 MG/ML IJ SOLN
100.0000 mg | Freq: Every day | INTRAMUSCULAR | Status: DC
  Administered 2017-07-18 – 2017-07-27 (×10): 100 mg via INTRAVENOUS
  Filled 2017-07-18 (×5): qty 2
  Filled 2017-07-18: qty 1
  Filled 2017-07-18: qty 2
  Filled 2017-07-18: qty 1
  Filled 2017-07-18 (×2): qty 2
  Filled 2017-07-18: qty 1

## 2017-07-18 MED ORDER — LORAZEPAM 2 MG/ML IJ SOLN
1.0000 mg | INTRAMUSCULAR | Status: DC | PRN
  Administered 2017-07-19 – 2017-07-21 (×15): 2 mg via INTRAVENOUS
  Filled 2017-07-18 (×16): qty 1

## 2017-07-18 MED ORDER — VANCOMYCIN HCL IN DEXTROSE 1-5 GM/200ML-% IV SOLN
1000.0000 mg | Freq: Once | INTRAVENOUS | Status: AC
  Administered 2017-07-18: 1000 mg via INTRAVENOUS
  Filled 2017-07-18: qty 200

## 2017-07-18 NOTE — Progress Notes (Signed)
CODE SEPSIS - PHARMACY COMMUNICATION  **Broad Spectrum Antibiotics should be administered within 1 hour of Sepsis diagnosis**  Time Code Sepsis Called/Page Received: 7680  Antibiotics Ordered: vancomycin/Zosyn  Time of 1st antibiotic administration: 0815  Additional action taken by pharmacy:   If necessary, Name of Provider/Nurse Contacted:     Napoleon Form ,PharmD Clinical Pharmacist  07/18/2017  8:30 AM

## 2017-07-18 NOTE — ED Notes (Signed)
Date and time results received: 07/18/17 8:13 AM  (use smartphrase ".now" to insert current time)  Test: potassium less than 2.0, glucose 40, calcium 4 Critical Value: see above  Name of Provider Notified: Dr. Kerman Passey  Orders Received? Or Actions Taken?: Orders Received - See Orders for details

## 2017-07-18 NOTE — H&P (Signed)
[facility] NAME: George Arellano    MR#:  [id number]  DATE OF BIRTH:  [birth year]  DATE OF ADMISSION:  07/18/2017  PRIMARY CARE PHYSICIAN: Lavonne Chick, MD   REQUESTING/REFERRING PHYSICIAN: Harvest Dark  CHIEF COMPLAINT: Shortness of breath   Chief Complaint  Patient presents with  . Shortness of Breath  . Alcohol Intoxication    HISTORY OF PRESENT ILLNESS:  George Arellano  is a 52 y.o. male with a known history of essential hypertension, COPD, history of heavy alcohol abuse, tobacco abuse comes in with shortness of breath getting worse since yesterday.  Patient has COPD, chronic shortness of breat hfor long time but got worse  yesterday.  Patient presented to emergency room with confusion and mental status.  According to EMS they were called for possible overdose.  Upon arrival to emergency room patient was hypoxic with O2 sats 84% on 6 L.  Patient also looked confused, mottled in appearance, unable to get blood pressure.  Patient drinks 2 packs of beers every day for a long time and last drink was Saturday and also admits that he smokes half pack of cigarettes since age 53.  Patient received 2 L of normal saline in the emergency room, initially required nonrebreather, now patient is getting BiPAP support, patient's ABG showed pH 7.02, PCO2 58, PO2 35, chest x-ray showed left sided pneumonia, elevated white count up to 16.9, severe blood abnormalities with potassium of less than 2, CO2 of 8, acute kidney injury with creatinine of 2.03.  lactic acid is 9.8.  Patient received vancomycin, Zosyn, IV KCl, IV calcium gluconate.  Patient also received 1 amp of D50 in the emergency room.  Patient is tremulous.  Tachycardic with heart rate up to 120 bpm.  Latest blood pressure is 77/50.  Patient is alert, awake, complains of shortness of breath, pleuritic chest pain.  PAST MEDICAL HISTORY:   Past Medical History:  Diagnosis Date  .  Anxiety   . CHF (congestive heart failure) ([place])   . COPD (chronic obstructive pulmonary disease) ([place])   . Hypertension     PAST SURGICAL HISTOIRY:  No past surgical history on file.  SOCIAL HISTORY:   Social History   Tobacco Use  . Smoking status: Current Every Day Smoker    Packs/day: 1.00    Types: Cigarettes  Substance Use Topics  . Alcohol use: Yes    Alcohol/week: 25.2 oz    Types: 42 Cans of beer per week    FAMILY HISTORY:   Family History  Problem Relation Age of Onset  . Hypertension Other     DRUG ALLERGIES:   Allergies  Allergen Reactions  . Levaquin [Levofloxacin] Other (See Comments)    Reaction:  Unknown     REVIEW OF SYSTEMS:  CONSTITUTIONAL: Generalized weakness EYES: No blurred or double vision.  EARS, NOSE, AND THROAT: No tinnitus or ear pain.  Followed by Dr.Hemang Manuella Ghazi for follow-up of upbeat nystagmus. RESPIRATORY: Cough, shortness of breath, wheezing, respiratory distress CARDIOVASCULAR: Pleuritic chest pain, GASTROINTESTINAL: No nausea, vomiting, diarrhea or abdominal pain.  GENITOURINARY: No dysuria, hematuria.  ENDOCRINE: No polyuria, nocturia,  HEMATOLOGY: No anemia, easy bruising or bleeding SKIN: No rash or lesion. MUSCULOSKELETAL: No joint pain or arthritis.   NEUROLOGIC: No tingling, numbness, weakness.  PSYCHIATRY: Anxiety.   MEDICATIONS AT HOME:   Prior to Admission medications   Medication Sig Start Date End Date Taking? Authorizing Provider  albuterol (PROVENTIL HFA;VENTOLIN HFA)  108 (90 BASE) MCG/ACT inhaler Inhale 2 puffs into the lungs every 6 (six) hours as needed for wheezing or shortness of breath.    [provider]  aspirin 325 MG tablet Take 650 mg by mouth every 4 (four) hours as needed for headache.    [provider]  budesonide-formoterol (SYMBICORT) 160-4.5 MCG/ACT inhaler Inhale 2 puffs into the lungs 2 (two) times daily.    [provider]  cloNIDine (CATAPRES) 0.1 MG tablet Take  0.1 mg by mouth 2 (two) times daily.    [provider]  dicyclomine (BENTYL) 20 MG tablet Take 1 tablet (20 mg total) by mouth 3 (three) times daily as needed (abdominal pain). 10/08/15   Nance Pear, MD  furosemide (LASIX) 20 MG tablet Take 20 mg by mouth daily.    [provider]  guaiFENesin (ROBITUSSIN) 100 MG/5ML SOLN Take 5 mLs (100 mg total) by mouth every 6 (six) hours as needed for cough or to loosen phlegm. 05/07/15   Vaughan Basta, MD  Ipratropium-Albuterol (COMBIVENT RESPIMAT) 20-100 MCG/ACT AERS respimat Inhale 2 puffs into the lungs every 6 (six) hours as needed for wheezing or shortness of breath.    [provider]  ipratropium-albuterol (DUONEB) 0.5-2.5 (3) MG/3ML SOLN Take 3 mLs by nebulization every 6 (six) hours as needed (for shortness of breath/wheezing).    [provider]  lisinopril (PRINIVIL,ZESTRIL) 2.5 MG tablet Take 2.5 mg by mouth 2 (two) times daily.    [provider]  metoprolol (LOPRESSOR) 50 MG tablet Take 50 mg by mouth 2 (two) times daily.    [provider]  oxyCODONE (OXY IR/ROXICODONE) 5 MG immediate release tablet Take 1 tablet (5 mg total) by mouth every 6 (six) hours as needed for moderate pain. 05/07/15   Vaughan Basta, MD  predniSONE (STERAPRED UNI-PAK 21 TAB) 10 MG (21) TBPK tablet Take 6 tabs first day, 5 tab on day 2, then 4 on day 3rd, 3 tabs on day 4th , 2 tab on day 5th, and 1 tab on 6th day. 05/07/15   Vaughan Basta, MD  ranitidine (ZANTAC) 150 MG tablet Take 150 mg by mouth 2 (two) times daily.    [provider]  sucralfate (CARAFATE) 1 g tablet Take 1 tablet (1 g total) by mouth 4 (four) times daily. 10/08/15   Nance Pear, MD      VITAL SIGNS:  Blood pressure (!) 79/57, pulse (!) 127, temperature 97.6 F (36.4 C), temperature source Oral, resp. rate (!) 25, height 5\' 9"  (1.753 m), weight 77.1 kg (170 lb), SpO2 100 %.  PHYSICAL EXAMINATION:   GENERAL:  52 y.o.-year-old patient lying in the bed with obvious respiratory distress. EYES: Pupils equal, round, reactive to light . No scleral icterus. Extraocular muscles intact.  HEENT: Head atraumatic, normocephalic. Oropharynx and nasopharynx clear.  NECK:  Supple, no jugular venous distention. No thyroid enlargement, no tenderness.  LUNGS: Expiratory wheezing all lung fields, using accessory muscles of respiration.  CARDIOVASCULAR: S1, S2 tachycardic.  Marland Kitchen No murmurs, rubs, or gallops.  ABDOMEN: Soft, nontender, nondistended. Bowel sounds present. No organomegaly or mass.  EXTREMITIES: No pedal edema, cyanosis, or clubbing.  NEUROLOGIC ; no gross neurological deficit.  PSYCHIATRIC: The patient is alert and  anxious SKIN: No obvious rash, lesion, or ulcer.   LABORATORY PANEL:   CBC Recent Labs  Lab 07/18/17 0658  WBC 16.9*  HGB 15.7  HCT 46.1  PLT 166   ------------------------------------------------------------------------------------------------------------------  Chemistries  Recent Labs  Lab 07/18/17  0658  NA 147*  K <2.0*  CL 127*  CO2 8*  GLUCOSE 40*  BUN 17  CREATININE 2.03*  CALCIUM <4.0*  AST 51*  ALT 20  ALKPHOS 18*  BILITOT 0.6   ------------------------------------------------------------------------------------------------------------------  Cardiac Enzymes Recent Labs  Lab 07/18/17 0658  TROPONINI <0.03   ------------------------------------------------------------------------------------------------------------------  RADIOLOGY:  Dg Chest Portable 1 View  Result Date: 07/18/2017 CLINICAL DATA:  Left-sided chest pain and shortness of breath. Elevated white blood cell count. History of CHF, COPD and hypertension. History of smoking. EXAM: PORTABLE CHEST 1 VIEW COMPARISON:  10/08/2015 FINDINGS: Interval development of a consolidative opacity with the left lung which obscures the left heart border. Otherwise, grossly unchanged cardiac silhouette  and mediastinal contours. Atherosclerotic plaque when the thoracic aorta. Minimal right basilar opacities favored to represent atelectasis. No definite pleural effusion or pneumothorax. No evidence of edema. No acute osseus abnormalities. IMPRESSION: Findings worrisome for left mid lung pneumonia, especially in the setting of elevated white blood cell cuff, however given the consolidated appearance of the left mid lung opacity as patient's history of smoking, a follow-up chest radiograph in 3 to 4 weeks after treatment is recommended to ensure resolution. Electronically Signed   By: Sandi Mariscal M.D.   On: 07/18/2017 08:32    EKG:   Orders placed or performed during the hospital encounter of 07/18/17  . ED EKG 12-Lead  . ED EKG 12-Lead  . EKG 12-Lead  . EKG 12-Lead  EKG shows sinus tachycardia with 124 bpm, narrow QRS complex normal axis, no ST-T changes.  IMPRESSION AND PLAN:   52 year old male patient with history of heavy alcohol abuse, heavy tobacco abuse comes in with altered mental status, respiratory difficulties and found to have hypoxia, hypotension. #1 acute respiratory failure with hypercapnia secondary to pneumonia and COPD exacerbation: Admitted to ICU, initially required nonrebreather now started on BiPAP, continue antibiotics with vancomycin, Zosyn, bronchodilators, IV steroids, I spoke with Dr.David Kasa(intensivist).  2.  Septic shock with severe sepsis secondary to pneumonia lactic acid 9.8,, severe metabolic acidosis with pH 7.02, serum bicarb of 8.  Continue aggressive hydration with bicarb drip, patient received 2 L of normal saline in the emergency room.  Hold antihypertensive at this time.  3.  Severe hypokalemia, hypocalcemia: Patient receiving IV potassium, calcium supplements, check magnesium levels.  4.  Acute kidney injury secondary to septic shock: Continue aggressive hydration, avoid nephrotoxic agents, monitor kidney function, monitor urine output closely.  Hold  Lasix, lisinopril.  5.  Heavy alcohol use, last drink was Saturday, watch closely for withdrawal symptoms using CIWA scale.  Continue thiamine, folic acid.  6.  patient is high risk for cardiac arrest, intubation.  All the records are reviewed and case discussed with ED provider. Management plans discussed with the patient, family and they are in agreement.  CODE STATUS: Full code  TOTAL TIME TAKING CARE OF THIS PATIENT: 60 minutes. (critical care)   Epifanio Lesches M.D on 07/18/2017 at 9:00 AM  Between 7am to 6pm - Pager - [phone]  After 6pm go to [email or web address] - password [email or web address] [facility]  [facility]  Office  [phone]  CC: Primary care physician; Lavonne Chick, MD  Note: This dictation was prepared with Dragon dictation along with smaller phrase technology. Any transcriptional errors that result from this process are unintentional.

## 2017-07-18 NOTE — Consult Note (Addendum)
[facility] Consultation    ASSESSMENT/PLAN   Respiratory distress. Patient with hypercapnic and hypoxemic respiratory failure. Known history of COPD presently on noninvasive, will add albuterol, Atrovent, Solu-Medrol, his chest x-ray also shows left sided dense consolidation versus pleural process. After respiratory status has been stabilized will obtain CT scan of the chest for further evaluation. Has been started empirically on Zosyn, will add vancomycin  Renal failure. Patient's creatinine has increased to 5.23. Also noted to have an elevated CK at 2635. Presently receiving volume resuscitation along with bicarbonate infusion, will follow CK closely, will obtain renal ultrasound  Leukocytosis. Most likely secondary to pneumonia, on Zosyn, will add vancomycin  Heavy alcohol use. Will place on CIWA  Hypomagnesemia. We'll replace magnesium along with thiamine  Elevated CK at 2635. Started on bicarbonate infusion will follow CK along with follow-up arterial blood gas.  Acid-based derangement. Patient noted to have an anion gap of 19, delta gap of 7, calculated starting bicarbonate 24 consistent with an initial metabolic acidosis along with superimposed respiratory acidosis with hypercapnic failure  Lactic acidosis. Most likely secondary to sepsis, volume resuscitate along with broad-spectrum anabiotic coverage.  Hypokalemia. Potassium replaced  Critical care time 45 minutes  Name: George Arellano MRN: [id number] DOB: 1966/02/23    ADMISSION DATE:  07/18/2017 CONSULTATION DATE:  07/18/2017  REFERRING MD :  Hospitalist Service  CHIEF COMPLAINT:  Respiratory distress   HISTORY OF PRESENT ILLNESS:  Patient with a known past medical history remarkable for alcoholism, congestive heart failure, COPD, hypertension, anxiety, presented with increasing shortness of breath since yesterday. When he presented to the emergency department he was also noted to be confused with  altered mental status. He was noted to be hypoxic with a saturation of 84% on 6 L, was subsequently started on noninvasive ventilation and he is moved to the intensive care unit for further evaluation and management. Initial arterial blood gas revealed a pH is 7.02, PCO2 of 58, PCO2 of 35 with chest x-ray remarkable for left-sided pneumonia. White count was elevated at 16.9 along with hypokalemia, CO2 of 8 and a creatinine of 2.03. Patient presently on noninvasive, is communicating, states that he is feeling much better.   PAST MEDICAL HISTORY :  Past Medical History:  Diagnosis Date  . Anxiety   . CHF (congestive heart failure) ([place])   . COPD (chronic obstructive pulmonary disease) ([place])   . Hypertension    No past surgical history on file. Prior to Admission medications   Medication Sig Start Date End Date Taking? Authorizing Provider  albuterol (PROVENTIL HFA;VENTOLIN HFA) 108 (90 BASE) MCG/ACT inhaler Inhale 2 puffs into the lungs every 6 (six) hours as needed for wheezing or shortness of breath.   Yes [provider]  budesonide-formoterol (SYMBICORT) 160-4.5 MCG/ACT inhaler Inhale 2 puffs into the lungs 2 (two) times daily.   Yes [provider]  dicyclomine (BENTYL) 20 MG tablet Take 1 tablet (20 mg total) by mouth 3 (three) times daily as needed (abdominal pain). 10/08/15  Yes Nance Pear, MD  Ipratropium-Albuterol (COMBIVENT RESPIMAT) 20-100 MCG/ACT AERS respimat Inhale 2 puffs into the lungs every 6 (six) hours as needed for wheezing or shortness of breath.   Yes [provider]  ipratropium-albuterol (DUONEB) 0.5-2.5 (3) MG/3ML SOLN Take 3 mLs by nebulization every 6 (six) hours as needed (for shortness of breath/wheezing).   Yes [provider]  lisinopril (PRINIVIL,ZESTRIL) 2.5 MG tablet Take 2.5 mg by mouth 2 (two) times daily.   Yes  [provider]  metoprolol (LOPRESSOR) 50 MG tablet Take 50 mg by mouth 2 (two) times daily.   Yes  [provider]  guaiFENesin (ROBITUSSIN) 100 MG/5ML SOLN Take 5 mLs (100 mg total) by mouth every 6 (six) hours as needed for cough or to loosen phlegm. Patient not taking: Reported on 07/18/2017 05/07/15   Vaughan Basta, MD  oxyCODONE (OXY IR/ROXICODONE) 5 MG immediate release tablet Take 1 tablet (5 mg total) by mouth every 6 (six) hours as needed for moderate pain. Patient not taking: Reported on 07/18/2017 05/07/15   Vaughan Basta, MD  predniSONE (STERAPRED UNI-PAK 21 TAB) 10 MG (21) TBPK tablet Take 6 tabs first day, 5 tab on day 2, then 4 on day 3rd, 3 tabs on day 4th , 2 tab on day 5th, and 1 tab on 6th day. Patient not taking: Reported on 07/18/2017 05/07/15   Vaughan Basta, MD  sucralfate (CARAFATE) 1 g tablet Take 1 tablet (1 g total) by mouth 4 (four) times daily. Patient not taking: Reported on 07/18/2017 10/08/15   Nance Pear, MD   Allergies  Allergen Reactions  . Levaquin [Levofloxacin] Other (See Comments)    Reaction:  Unknown     FAMILY HISTORY:  Family History  Problem Relation Age of Onset  . Hypertension Other    SOCIAL HISTORY:  reports that he has been smoking cigarettes.  He has been smoking about 1.00 pack per day. He does not have any smokeless tobacco history on file. He reports that he drinks about 25.2 oz of alcohol per week. He reports that he uses drugs. Drug: Marijuana.  REVIEW OF SYSTEMS:   The remainder of systems were reviewed and were found to be negative other than what is documented in the HPI.    VITAL SIGNS: Temp:  [97.6 F (36.4 C)] 97.6 F (36.4 C) (01/07 0656) Pulse Rate:  [116-127] 120 (01/07 1030) Resp:  [25-31] 31 (01/07 1030) BP: (74-88)/(54-62) 88/58 (01/07 1030) SpO2:  [84 %-100 %] 100 % (01/07 1030) Weight:  [77.1 kg (170 lb)] 77.1 kg (170 lb) (01/07 0647) HEMODYNAMICS:   Patient presently on BiPAP with respiratory rate in the low 30s.   INTAKE / OUTPUT:  Intake/Output Summary (Last 24 hours)  at 07/18/2017 1055 Last data filed at 07/18/2017 0906 Gross per 24 hour  Intake 1150 ml  Output -  Net 1150 ml    Physical Examination:   VS: BP (!) 88/58   Pulse (!) 120   Temp 97.6 F (36.4 C) (Oral)   Resp (!) 31   Ht 5\' 9"  (1.753 m)   Wt 77.1 kg (170 lb)   SpO2 100%   BMI 25.10 kg/m   General Appearance: Patient presently in respiratory distress, breathing 30 times a minute on noninvasive ventilation, appears agitated Neuro: Patient is oriented, communicative, moving all extremities. HEENT: PERRLA, EOM intact, no ptosis, no other lesions noticed;  Pulmonary: Coarse rhonchi appreciated prolonged expiratory phase with respiratory crackles right greater than left Cardiovascular tachycardia at 110 Abdomen: Benign, Soft, non-tender, No masses, hepatosplenomegaly, No lymphadenopathy Skin:   Patient's skin appears mottled Extremities: normal, no cyanosis, clubbing, no edema   LABS: Reviewed   LABORATORY PANEL:   CBC Recent Labs  Lab 07/18/17 0658  WBC 16.9*  HGB 15.7  HCT 46.1  PLT 166    Chemistries  Recent Labs  Lab 07/18/17 0658 07/18/17 0815  NA 147* 137  K <2.0* 5.1  CL 127* 101  CO2 8* 17*  GLUCOSE  40* 164*  BUN 17 34*  CREATININE 2.03* 5.23*  CALCIUM <4.0* 7.3*  MG 1.4*  --   AST 51* 109*  ALT 20 37  ALKPHOS 18* 30*  BILITOT 0.6 1.2    No results for input(s): GLUCAP in the last 168 hours. No results for input(s): PHART, PCO2ART, PO2ART in the last 168 hours. Recent Labs  Lab 07/18/17 0658 07/18/17 0815  AST 51* 109*  ALT 20 37  ALKPHOS 18* 30*  BILITOT 0.6 1.2  ALBUMIN 1.3* 2.5*    Cardiac Enzymes Recent Labs  Lab 07/18/17 0658  TROPONINI <0.03    RADIOLOGY:  Dg Chest Portable 1 View  Result Date: 07/18/2017 CLINICAL DATA:  Left-sided chest pain and shortness of breath. Elevated white blood cell count. History of CHF, COPD and hypertension. History of smoking. EXAM: PORTABLE CHEST 1 VIEW COMPARISON:  10/08/2015 FINDINGS:  Interval development of a consolidative opacity with the left lung which obscures the left heart border. Otherwise, grossly unchanged cardiac silhouette and mediastinal contours. Atherosclerotic plaque when the thoracic aorta. Minimal right basilar opacities favored to represent atelectasis. No definite pleural effusion or pneumothorax. No evidence of edema. No acute osseus abnormalities. IMPRESSION: Findings worrisome for left mid lung pneumonia, especially in the setting of elevated white blood cell cuff, however given the consolidated appearance of the left mid lung opacity as patient's history of smoking, a follow-up chest radiograph in 3 to 4 weeks after treatment is recommended to ensure resolution. Electronically Signed   By: Sandi Mariscal M.D.   On: 07/18/2017 08:32     Hermelinda Dellen, DO 07/18/2017, 10:55 AM

## 2017-07-18 NOTE — ED Notes (Signed)
Receiving RN unable to take report at this time. Receiving RN will call when available.

## 2017-07-18 NOTE — ED Provider Notes (Signed)
[facility] Emergency Department Provider Note  Time seen: 7:23 AM  I have reviewed the triage vital signs and the nursing notes.   HISTORY  Chief Complaint Shortness of Breath and Alcohol Intoxication    HPI George Arellano is a 52 y.o. male with a past medical history of anxiety, CHF, COPD, hypertension, per report alcohol use, presents to the emergency department with confusion and altered mental status.  According to EMS the original call was for a possible overdose sent from home.  Patient denies any drugs except marijuana.  Admits to alcohol use.  Upon arrival patient is confused, mottled in appearance unable to palpated blood pressure, hypoxic 84% on 6 L nasal cannula.  Patient admits to alcohol use denies drug use.  States he stopped drinking on Saturday (2 days ago).  Patient is quite tremulous during exam.  Patient denies any chest pain, abdominal pain or trouble breathing.  Largely negative review of systems although accuracy is somewhat questionable given his confusion.   Past Medical History:  Diagnosis Date  . Anxiety   . CHF (congestive heart failure) ([place])   . COPD (chronic obstructive pulmonary disease) ([place])   . Hypertension     Patient Active Problem List   Diagnosis Date Noted  . Community acquired pneumonia 05/05/2015    No past surgical history on file.  Prior to Admission medications   Medication Sig Start Date End Date Taking? Authorizing Provider  albuterol (PROVENTIL HFA;VENTOLIN HFA) 108 (90 BASE) MCG/ACT inhaler Inhale 2 puffs into the lungs every 6 (six) hours as needed for wheezing or shortness of breath.    [provider]  aspirin 325 MG tablet Take 650 mg by mouth every 4 (four) hours as needed for headache.    [provider]  budesonide-formoterol (SYMBICORT) 160-4.5 MCG/ACT inhaler Inhale 2 puffs into the lungs 2 (two) times daily.    [provider]  cloNIDine (CATAPRES) 0.1 MG tablet Take 0.1 mg  by mouth 2 (two) times daily.    [provider]  dicyclomine (BENTYL) 20 MG tablet Take 1 tablet (20 mg total) by mouth 3 (three) times daily as needed (abdominal pain). 10/08/15   Nance Pear, MD  furosemide (LASIX) 20 MG tablet Take 20 mg by mouth daily.    [provider]  guaiFENesin (ROBITUSSIN) 100 MG/5ML SOLN Take 5 mLs (100 mg total) by mouth every 6 (six) hours as needed for cough or to loosen phlegm. 05/07/15   Vaughan Basta, MD  Ipratropium-Albuterol (COMBIVENT RESPIMAT) 20-100 MCG/ACT AERS respimat Inhale 2 puffs into the lungs every 6 (six) hours as needed for wheezing or shortness of breath.    [provider]  ipratropium-albuterol (DUONEB) 0.5-2.5 (3) MG/3ML SOLN Take 3 mLs by nebulization every 6 (six) hours as needed (for shortness of breath/wheezing).    [provider]  lisinopril (PRINIVIL,ZESTRIL) 2.5 MG tablet Take 2.5 mg by mouth 2 (two) times daily.    [provider]  metoprolol (LOPRESSOR) 50 MG tablet Take 50 mg by mouth 2 (two) times daily.    [provider]  oxyCODONE (OXY IR/ROXICODONE) 5 MG immediate release tablet Take 1 tablet (5 mg total) by mouth every 6 (six) hours as needed for moderate pain. 05/07/15   Vaughan Basta, MD  predniSONE (STERAPRED UNI-PAK 21 TAB) 10 MG (21) TBPK tablet Take 6 tabs first day, 5 tab on day 2, then 4 on day 3rd, 3 tabs on day 4th , 2 tab on day 5th, and  1 tab on 6th day. 05/07/15   Vaughan Basta, MD  ranitidine (ZANTAC) 150 MG tablet Take 150 mg by mouth 2 (two) times daily.    [provider]  sucralfate (CARAFATE) 1 g tablet Take 1 tablet (1 g total) by mouth 4 (four) times daily. 10/08/15   Nance Pear, MD    Allergies  Allergen Reactions  . Levaquin [Levofloxacin] Other (See Comments)    Reaction:  Unknown     Family History  Problem Relation Age of Onset  . Hypertension Other     Social History Social History   Tobacco  Use  . Smoking status: Current Every Day Smoker    Packs/day: 1.00    Types: Cigarettes  Substance Use Topics  . Alcohol use: Yes    Alcohol/week: 25.2 oz    Types: 42 Cans of beer per week  . Drug use: Yes    Types: Marijuana    Review of Systems Unable to obtain an accurate review of systems due to altered mental status  ____________________________________________   PHYSICAL EXAM:  VITAL SIGNS: ED Triage Vitals  Enc Vitals Group     BP --      Pulse Rate 07/18/17 0655 (!) 127     Resp 07/18/17 0655 (!) 28     Temp 07/18/17 0656 97.6 F (36.4 C)     Temp Source 07/18/17 0656 Oral     SpO2 07/18/17 0656 (!) 84 %     Weight 07/18/17 0647 170 lb (77.1 kg)     Height 07/18/17 0647 5\' 9"  (1.753 m)     Head Circumference --      Peak Flow --      Pain Score --      Pain Loc --      Pain Edu? --      Excl. in [place]? --    Constitutional: Alert, confused.  Tremulous, able to answer simple questions and follow simple commands. Eyes: Normal exam ENT   Head: Normocephalic and atraumatic   Mouth/Throat: Dry mucous membranes Cardiovascular: Regular rhythm around 120 bpm Respiratory: Normal respiratory effort without tachypnea  Gastrointestinal: Soft and nontender. No distention.   Musculoskeletal: Nontender with normal range of motion in all extremities.  Neurologic:  Normal speech and language. No gross focal neurologic deficits  Skin:  Skin is mottled and cool to the touch Psychiatric: Mood and affect are normal. Speech and behavior are normal.   ____________________________________________    EKG  EKG reviewed and interpreted by myself shows sinus tachycardia 124 bpm with a narrow QRS, normal axis, normal intervals, nonspecific ST changes.  ____________________________________________    RADIOLOGY  X-ray concerning for possible left lung pneumonia  ____________________________________________   INITIAL IMPRESSION / ASSESSMENT AND PLAN / ED  COURSE  Pertinent labs & imaging results that were available during my care of the patient were reviewed by me and considered in my medical decision making (see chart for details).  Patient presents to the emergency department with significant confusion/altered mental status.  Differential would include metabolic abnormality, electrolyte abnormality, dehydration, infectious etiology.  Patient's blood pressure very low, unable to palpate a radial pulse, tachycardic in the 120s.  Sepsis protocols have been initiated.  He is able to answer questions although very confused.  Denies any chest pain or abdominal pain but again confused and likely not accurate responses.  I placed an ultrasound-guided 18-gauge IV to his left upper extremity.  Patient has received 2 L of fluids, now has a blood  pressure in the mid 80s at this time with a heart rate of 120.  Patient is much more coherent, able to answer questions.  Patient states he admits to drinking alcohol but stopped using alcohol 2 days ago.  For the last 2 days he states he has been in bed is not been eating or drinking..  Patient's VBG has resulted with a 7.02 pH.  We will continue with IV fluids.  I have added on a creatinine kinase level.  Patient receiving broad-spectrum antibiotics.   Critical labs have resulted with a potassium of less than 2, calcium around 4, glucose in the 40s.  We will repeat labs however we will begin IV repletion while awaiting lab repeats including D50, IV potassium and IV calcium gluconate.  X-ray on my read appears to show left-sided pneumonia.  Patient receiving broad-spectrum antibiotics.  Labs are being repeated.  Patient continues to have rhonchorous breath sounds especially in the left side.  X-ray appears to show pneumonia.  We will start the patient on BiPAP as he is only satting in the low to mid 90s on.  Continues to be hypotensive however map in the upper 60s to low 70s.   Discussed with the hospitalist for  admission.  CRITICAL CARE Performed by: Harvest Dark   Total critical care time: 90 minutes  Critical care time was exclusive of separately billable procedures and treating other patients.  Critical care was necessary to treat or prevent imminent or life-threatening deterioration.  Critical care was time spent personally by me on the following activities: development of treatment plan with patient and/or surrogate as well as nursing, discussions with consultants, evaluation of patient's response to treatment, examination of patient, obtaining history from patient or surrogate, ordering and performing treatments and interventions, ordering and review of laboratory studies, ordering and review of radiographic studies, pulse oximetry and re-evaluation of patient's condition.  ____________________________________________   FINAL CLINICAL IMPRESSION(S) / ED DIAGNOSES  Altered mental status Sepsis hypoxia Pneumonia   Harvest Dark, MD 07/18/17 [phone]

## 2017-07-18 NOTE — ED Notes (Signed)
Pt mother Benjie Karvonen [phone] given update of pt care with pt verbal permission.

## 2017-07-18 NOTE — Progress Notes (Signed)
Pharmacy Anticoagulation Note  52 y/o M ordered Lovenox 30 mg daily for DVT prophylaxis.   Filed Weights   07/18/17 0647  Weight: 170 lb (77.1 kg)   Body mass index is 25.1 kg/m.  Estimated Creatinine Clearance: 16.9 mL/min (A) (by C-G formula based on SCr of 5.23 mg/dL (H)).  Will convert patient to subcutaneous heparin 5000 units q 8 hours due to acute renal failure.   Ulice Dash, PharmD Clinical Pharmacist

## 2017-07-18 NOTE — Progress Notes (Signed)
Pulmonary / Critical Care  Procedure Note Central Line Placement Consent Obtained Procedureal timeout Right femoral location secondary to patient on NIV difficult to lay flat and moving head continuously Performed under US guidance Complete contact barrier Topical hibiclens Subdermal lidocaine. Under US guidance the right femoral vein was cannulated without difficulty on first pass. Guidewire placed and needle removed. Using the seldinger technique a triple lumen catheter was placed without diffifulty Guidewire was removed in tact All three ports had dark non pulsitile return. All ports flushed Sutured into place Dressed by nurse Tolerated well  Hermelinda Dellen, DO

## 2017-07-18 NOTE — Progress Notes (Signed)
Pharmacy Antibiotic Note  George Arellano is a 52 y.o. male admitted on 07/18/2017 with sepsis.  Pharmacy has been consulted for vancomycin and Zosyn dosing. Patient is in acute renal failure.   Plan: Vancomycin 1000 mg iv once given. Will order an additional vancomycin 500 mg iv once and check a random vancomycin level with AM labs. Will dose by levels for now.   Zosyn 3.375 g EI q 12 hours.   Height: 5\' 9"  (175.3 cm) Weight: 170 lb (77.1 kg) IBW/kg (Calculated) : 70.7  Temp (24hrs), Avg:97.6 F (36.4 C), Min:97.6 F (36.4 C), Max:97.6 F (36.4 C)  Recent Labs  Lab 07/18/17 0658 07/18/17 0718 07/18/17 0815 07/18/17 1047  WBC 16.9*  --   --   --   CREATININE 2.03*  --  5.23*  --   LATICACIDVEN  --  9.8*  --  3.2*    Estimated Creatinine Clearance: 16.9 mL/min (A) (by C-G formula based on SCr of 5.23 mg/dL (H)).    Allergies  Allergen Reactions  . Levaquin [Levofloxacin] Other (See Comments)    Reaction:  Unknown     Antimicrobials this admission: Zosyn 1/7 >>  vancomycin 1/7 >>   Dose adjustments this admission:   Microbiology results: 1/7 BCx: NGTD 1/7 UCx: sent  1/7 MRSA PCR: negative  Thank you for allowing pharmacy to be a part of this patient's care.  [name] 07/18/2017 12:46 PM

## 2017-07-18 NOTE — ED Notes (Signed)
Verified with Santiago Glad, Pharmacist that potassium chloride and calcium gluconate are compatible.

## 2017-07-18 NOTE — Progress Notes (Signed)
Pharmacy Electrolyte Monitoring Consult:  Pharmacy consulted to assist in monitoring and replacing electrolytes in this 52 y.o. male admitted on 07/18/2017 with Shortness of Breath and Alcohol Intoxication   Labs:  Sodium (mmol/L)  Date Value  07/18/2017 137  06/01/2014 136   Potassium (mmol/L)  Date Value  07/18/2017 5.1  06/01/2014 4.1   Magnesium (mg/dL)  Date Value  07/18/2017 1.4 (L)  11/23/2013 2.1   Calcium (mg/dL)  Date Value  07/18/2017 7.3 (L)   Calcium, Total (mg/dL)  Date Value  06/01/2014 8.5   Albumin (g/dL)  Date Value  07/18/2017 2.5 (L)  06/04/2014 2.6 (L)    Assessment/Plan: K replaced this AM and is WNL. SCr is greatly increased from AM labs. Will replace magnesium conservatigely with magnesium sulfate 2 g iv once. Will recheck electrolytes at 1400 and with AM labs.   [name] 07/18/2017 1:11 PM

## 2017-07-18 NOTE — ED Notes (Signed)
Date and time results received: 07/18/17 8:28 AM  (use smartphrase ".now" to insert current time)  Test: lacticacid Critical Value: 9.8  Name of Provider Notified: Dr. Kerman Passey  Orders Received? Or Actions Taken?: Orders Received - See Orders for details

## 2017-07-18 NOTE — ED Triage Notes (Signed)
Patient to rm 26 via EMS from home.  Per EMS original call was for overdose.  Patient reported to EMS being on a 2 day drinking binge, denies any drug use except for "pot".  EMS reports pulse oxi 88% on 6l via nasal cannula, history of COPD on 02 at 2liters all time.

## 2017-07-18 NOTE — ED Notes (Signed)
Code Sepsis called to [place]

## 2017-07-19 ENCOUNTER — Inpatient Hospital Stay: Payer: Medicaid Other

## 2017-07-19 LAB — BLOOD GAS, ARTERIAL
ACID-BASE DEFICIT: 1.6 mmol/L (ref 0.0–2.0)
Acid-Base Excess: 1.2 mmol/L (ref 0.0–2.0)
Acid-Base Excess: 6.6 mmol/L — ABNORMAL HIGH (ref 0.0–2.0)
Allens test (pass/fail): POSITIVE — AB
BICARBONATE: 25 mmol/L (ref 20.0–28.0)
BICARBONATE: 30.5 mmol/L — AB (ref 20.0–28.0)
Bicarbonate: 21.3 mmol/L (ref 20.0–28.0)
DELIVERY SYSTEMS: POSITIVE
DELIVERY SYSTEMS: POSITIVE
DELIVERY SYSTEMS: POSITIVE
EXPIRATORY PAP: 6
EXPIRATORY PAP: 8
Expiratory PAP: 6
FIO2: 0.28
FIO2: 0.4
FIO2: 0.5
Inspiratory PAP: 12
Inspiratory PAP: 12
Inspiratory PAP: 12
LHR: 8 {breaths}/min
MECHANICAL RATE: 8
O2 Saturation: 93.5 %
O2 Saturation: 94.1 %
O2 Saturation: 95.7 %
PATIENT TEMPERATURE: 37
PATIENT TEMPERATURE: 37
PH ART: 7.46 — AB (ref 7.350–7.450)
PH ART: 7.49 — AB (ref 7.350–7.450)
Patient temperature: 37
pCO2 arterial: 30 mmHg — ABNORMAL LOW (ref 32.0–48.0)
pCO2 arterial: 36 mmHg (ref 32.0–48.0)
pCO2 arterial: 40 mmHg (ref 32.0–48.0)
pH, Arterial: 7.45 (ref 7.350–7.450)
pO2, Arterial: 63 mmHg — ABNORMAL LOW (ref 83.0–108.0)
pO2, Arterial: 68 mmHg — ABNORMAL LOW (ref 83.0–108.0)
pO2, Arterial: 75 mmHg — ABNORMAL LOW (ref 83.0–108.0)

## 2017-07-19 LAB — HIV ANTIBODY (ROUTINE TESTING W REFLEX): HIV SCREEN 4TH GENERATION: NONREACTIVE

## 2017-07-19 LAB — BASIC METABOLIC PANEL
Anion gap: 14 (ref 5–15)
BUN: 58 mg/dL — AB (ref 6–20)
CHLORIDE: 99 mmol/L — AB (ref 101–111)
CO2: 25 mmol/L (ref 22–32)
CREATININE: 5.01 mg/dL — AB (ref 0.61–1.24)
Calcium: 6.4 mg/dL — CL (ref 8.9–10.3)
GFR calc Af Amer: 14 mL/min — ABNORMAL LOW (ref >60)
GFR calc non Af Amer: 12 mL/min — ABNORMAL LOW (ref >60)
GLUCOSE: 149 mg/dL — AB (ref 65–99)
Potassium: 4.4 mmol/L (ref 3.5–5.1)
Sodium: 138 mmol/L (ref 135–145)

## 2017-07-19 LAB — GLUCOSE, CAPILLARY
GLUCOSE-CAPILLARY: 138 mg/dL — AB (ref 65–99)
GLUCOSE-CAPILLARY: 141 mg/dL — AB (ref 65–99)
GLUCOSE-CAPILLARY: 147 mg/dL — AB (ref 65–99)
Glucose-Capillary: 137 mg/dL — ABNORMAL HIGH (ref 65–99)
Glucose-Capillary: 162 mg/dL — ABNORMAL HIGH (ref 65–99)
Glucose-Capillary: 154 mg/dL — ABNORMAL HIGH (ref 65–99)

## 2017-07-19 LAB — CBC
HCT: 35.4 % — ABNORMAL LOW (ref 40.0–52.0)
Hemoglobin: 12.1 g/dL — ABNORMAL LOW (ref 13.0–18.0)
MCH: 35.2 pg — AB (ref 26.0–34.0)
MCHC: 34.2 g/dL (ref 32.0–36.0)
MCV: 102.7 fL — AB (ref 80.0–100.0)
PLATELETS: 84 10*3/uL — AB (ref 150–440)
RBC: 3.45 MIL/uL — AB (ref 4.40–5.90)
RDW: 12.3 % (ref 11.5–14.5)
WBC: 8.2 10*3/uL (ref 3.8–10.6)

## 2017-07-19 LAB — VANCOMYCIN, RANDOM: Vancomycin Rm: 21

## 2017-07-19 LAB — MAGNESIUM: Magnesium: 2.2 mg/dL (ref 1.7–2.4)

## 2017-07-19 LAB — LACTIC ACID, PLASMA: LACTIC ACID, VENOUS: 2.8 mmol/L — AB (ref 0.5–1.9)

## 2017-07-19 LAB — PHOSPHORUS: PHOSPHORUS: 6.7 mg/dL — AB (ref 2.5–4.6)

## 2017-07-19 MED ORDER — SODIUM CHLORIDE 0.9 % IV SOLN
1.0000 g | Freq: Once | INTRAVENOUS | Status: AC
  Administered 2017-07-19: 1 g via INTRAVENOUS
  Filled 2017-07-19: qty 10

## 2017-07-19 MED ORDER — MORPHINE SULFATE (PF) 2 MG/ML IV SOLN
2.0000 mg | INTRAVENOUS | Status: DC | PRN
  Administered 2017-07-19 – 2017-07-20 (×3): 2 mg via INTRAVENOUS
  Administered 2017-07-20 – 2017-07-24 (×4): 4 mg via INTRAVENOUS
  Filled 2017-07-19 (×2): qty 2
  Filled 2017-07-19 (×3): qty 1
  Filled 2017-07-19 (×2): qty 2

## 2017-07-19 MED ORDER — ADULT MULTIVITAMIN W/MINERALS CH
1.0000 | ORAL_TABLET | Freq: Every day | ORAL | Status: DC
  Administered 2017-07-24 – 2017-08-01 (×9): 1 via ORAL
  Filled 2017-07-19 (×9): qty 1

## 2017-07-19 MED ORDER — SODIUM CHLORIDE 0.9 % IV BOLUS (SEPSIS)
1000.0000 mL | INTRAVENOUS | Status: AC
  Administered 2017-07-19 (×2): 1000 mL via INTRAVENOUS

## 2017-07-19 MED ORDER — MORPHINE SULFATE (PF) 2 MG/ML IV SOLN: 2.0000 mg | INTRAVENOUS | Status: DC | PRN

## 2017-07-19 NOTE — Progress Notes (Signed)
Pt has been alert and oriented during the night.  Precedex was titrated down to .4.  Lungs remain very rhoncus. Pt remains on bipap at 50%.  When bipap is removed for mouth care, pt drops sats quickly.  Pt has had 3 watery bowel movements during the night, but so small in size that an adequate sample was not collected.  Contact precautions were initiated.  Foley catheter was inserted per NP request.  Critical calcium level was called and NP was informed.  1 g. Calcium chloride was ordered for replacement and was passed in report.

## 2017-07-19 NOTE — Progress Notes (Signed)
Pharmacy Electrolyte Monitoring Consult:  Pharmacy consulted to assist in monitoring and replacing electrolytes in this 52 y.o. male admitted on 07/18/2017 with Shortness of Breath and Alcohol Intoxication  Labs:  Sodium (mmol/L)  Date Value  07/19/2017 138  06/01/2014 136   Potassium (mmol/L)  Date Value  07/19/2017 4.4  06/01/2014 4.1   Magnesium (mg/dL)  Date Value  07/19/2017 2.2  11/23/2013 2.1   Phosphorus (mg/dL)  Date Value  07/19/2017 6.7 (H)   Calcium (mg/dL)  Date Value  07/19/2017 6.4 (LL)   Calcium, Total (mg/dL)  Date Value  06/01/2014 8.5   Albumin (g/dL)  Date Value  07/18/2017 2.5 (L)  06/04/2014 2.6 (L)  CCa= 7.6  Assessment/Plan: Calcium chloride 1 g per CCM. Phosphorus level decreasing. Will f/u AM labs.   [name] 07/19/2017 12:18 PM

## 2017-07-19 NOTE — Progress Notes (Signed)
[facility] [place] NAME: George Arellano    MR#:  [id number]  DATE OF BIRTH:  1965/09/24  SUBJECTIVE: Critically ill requiring BiPAP, Precedex drip, Neo-Synephrine drip.  Worsening renal function,  On percedex drip for alcohol withdrawal.  CHIEF COMPLAINT:   Chief Complaint  Patient presents with  . Shortness of Breath  . Alcohol Intoxication    REVIEW OF SYSTEMS:   Review of Systems  Unable to perform ROS: Acuity of condition     DRUG ALLERGIES:   Allergies  Allergen Reactions  . Levaquin [Levofloxacin] Other (See Comments)    Reaction:  Unknown     VITALS:  Blood pressure (!) 82/67, pulse 95, temperature 99 F (37.2 C), temperature source Axillary, resp. rate (!) 24, height 5\' 9"  (1.753 m), weight 79.9 kg (176 lb 2.4 oz), SpO2 100 %.  PHYSICAL EXAMINATION:  GENERAL:  52 y.o.-year-old patient lying in the bed with bipap EYES: Pupils equal, round, reactive to light o scleral icterus. Extraocular muscles intact.  HEENT: Head atraumatic, normocephalic. Oropharynx and nasopharynx clear.  NECK:  Supple, no jugular venous distention. No thyroid enlargement, no tenderness.  LUNGS: Expiratory wheeze bilaterally.   No Rales,rhonchi or crepitation. No use of accessory muscles of respiration.  CARDIOVASCULAR: S1, S2 normal. No murmurs, rubs, or gallops.  ABDOMEN: Soft, nontender, nondistended. Bowel sounds present. No organomegaly or mass.  EXTREMITIES: No pedal edema, cyanosis, or clubbing.  NEUROLOGIC: Sedated.Marland Kitchen  PSYCHIATRIC: Sedated on Precedex drip. SKIN: No obvious rash, lesion, or ulcer.    LABORATORY PANEL:   CBC Recent Labs  Lab 07/19/17 0531  WBC 8.2  HGB 12.1*  HCT 35.4*  PLT 84*   ------------------------------------------------------------------------------------------------------------------  Chemistries  Recent Labs  Lab 07/18/17 0815  07/19/17 0531  NA 137   < > 138  K 5.1   < > 4.4  CL 101   < >  99*  CO2 17*   < > 25  GLUCOSE 164*   < > 149*  BUN 34*   < > 58*  CREATININE 5.23*   < > 5.01*  CALCIUM 7.3*   < > 6.4*  MG  --    < > 2.2  AST 109*  --   --   ALT 37  --   --   ALKPHOS 30*  --   --   BILITOT 1.2  --   --    < > = values in this interval not displayed.   ------------------------------------------------------------------------------------------------------------------  Cardiac Enzymes Recent Labs  Lab 07/18/17 0658  TROPONINI <0.03   ------------------------------------------------------------------------------------------------------------------  RADIOLOGY:  Ct Chest Wo Contrast  Result Date: 07/19/2017 CLINICAL DATA:  Respiratory distress.  Left-sided pneumonia. EXAM: CT CHEST WITHOUT CONTRAST TECHNIQUE: Multidetector CT imaging of the chest was performed following the standard protocol without IV contrast. COMPARISON:  07/18/2017 chest x-ray. FINDINGS: Cardiovascular: Coronary artery calcifications and aortic calcifications. No evidence of aortic aneurysm. Heart is mildly enlarged. Mediastinum/Nodes: Subcarinal adenopathy measuring 1.9 cm in short axis diameter. Borderline sized right paratracheal and AP window lymph nodes. No visible hilar adenopathy although evaluation is difficult without intravenous contrast. No axillary adenopathy. Lungs/Pleura: Dense consolidation in the left upper lobe, particularly lingula, right lower lobe, with areas of consolidation also noted in the right upper lobe, right middle lobe and left lower lobe. Findings most compatible with multifocal pneumonia. Trace bilateral effusions. Upper Abdomen: Fatty infiltration of the liver. No acute findings in the upper abdomen. Musculoskeletal: Chest wall soft tissues  are unremarkable. No acute bony abnormality. IMPRESSION: Areas of dense consolidation in the lungs bilaterally, most confluent in the left upper lobe and right lower lobe compatible with multifocal pneumonia. Mild cardiomegaly. Coronary  artery disease. Aortic Atherosclerosis (ICD10-I70.0). Fatty liver. Electronically Signed   By: Rolm Baptise M.D.   On: 07/19/2017 09:48   Dg Chest Portable 1 View  Result Date: 07/18/2017 CLINICAL DATA:  Left-sided chest pain and shortness of breath. Elevated white blood cell count. History of CHF, COPD and hypertension. History of smoking. EXAM: PORTABLE CHEST 1 VIEW COMPARISON:  10/08/2015 FINDINGS: Interval development of a consolidative opacity with the left lung which obscures the left heart border. Otherwise, grossly unchanged cardiac silhouette and mediastinal contours. Atherosclerotic plaque when the thoracic aorta. Minimal right basilar opacities favored to represent atelectasis. No definite pleural effusion or pneumothorax. No evidence of edema. No acute osseus abnormalities. IMPRESSION: Findings worrisome for left mid lung pneumonia, especially in the setting of elevated white blood cell cuff, however given the consolidated appearance of the left mid lung opacity as patient's history of smoking, a follow-up chest radiograph in 3 to 4 weeks after treatment is recommended to ensure resolution. Electronically Signed   By: Sandi Mariscal M.D.   On: 07/18/2017 08:32    EKG:   Orders placed or performed during the hospital encounter of 07/18/17  . ED EKG 12-Lead  . ED EKG 12-Lead  . EKG 12-Lead  . EKG 12-Lead    ASSESSMENT AND PLAN:   Acute respiratory failure with hypoxia, hypercapnia due to pneumonia and COPD exacerbation: Patient on BiPAP at this time, continue IV antibiotics, bronchodilators. 2.  Septic shock present on admission: Patient is requiring Neo-Synephrine, IV fluids with bicarb drip.  Patient renal function is worse and creatinine up to 5.  CK elevated to 265.  Calcium level is coming down. 3.  Acute renal failure creatinine up to 5.  Monitor urine output, nephrology consulted, obtain renal ultrasound, mild rhabdomyolysis: Continue to check CK. 4.  Pneumonia: Continue vancomycin,  Zosyn.  Trend the white count. 5.  Severe hypokalemia, hypomagnesemia: Pharmacy consult for current management.  5.  Heavy alcohol use, tobacco abuse: Continue alcohol withdrawal protocol with Precedex drip. 6.  GERD continue PPI, #7 hypokalemia secondary to GI losses: Potassium improved after replacement. DVT prophylaxis. High risk for cardiac arrest is secondary to multiorgan failure with respiratory failure, renal failure, septic shock     All the records are reviewed and case discussed with Care Management/Social Workerr. Management plans discussed with the patient, family and they are in agreement.  CODE STATUS: full  TOTAL TIME TAKING CARE OF THIS PATIENT: 49minutes.      Epifanio Lesches M.D on 07/19/2017 at 1:07 PM  Between 7am to 6pm - Pager - [phone]  After 6pm go to [email or web address] - password [email or web address]  Office  [phone]  CC: Primary care physician; Lavonne Chick, MD   Note: This dictation was prepared with Dragon dictation along with smaller phrase technology. Any transcriptional errors that result from this process are unintentional.

## 2017-07-19 NOTE — Progress Notes (Addendum)
1800 Weaned Neo-Synephrine drip to 24 mcg/min. Precedex off but agitation increasing. Ativan prn given 4 times today. Now hallucinating and talking to people who are not in the room. Bath reviewed multiple small scabs all over body including scrotum. Also scratch noted on right side of scrotum approximately 3 inches long. Stool washed from feet bilaterally and in between toes of feet as well as under toe nails. Hair and beard washed-smelled strongly of cigarette smoke. BiPAP off since 4 pm.

## 2017-07-19 NOTE — Progress Notes (Signed)
[facility] Progess Note    SYNOPSIS   Patient with a known past medical history remarkable for alcoholism, congestive heart failure, COPD, hypertension, anxiety, presented with increasing shortness of breath since yesterday. When he presented to the emergency department he was also noted to be confused with altered mental status. He was noted to be hypoxic with a saturation of 84% on 6 L, was subsequently started on noninvasive ventilation and he is moved to the intensive care unit for further evaluation and management.  ASSESSMENT/PLAN   Respiratory distress. Patient with left-sided pneumonia, still concerned he may be a fluid component to this, we'll send out for CT scan of his chest since his respiratory status is more stabilize. Component of COPD also, presently on Solu-Medrol and bronchodilators  Alcohol withdrawal. Has received thiamine, folic acid and multivitamins, presently on Precedex, CIWA with as needed benzodiazepine  Renal failure. BUN/creatinine is 58/5, calcium is low at 6.4 will correct for albumin and adjust accordingly, patient with rhabdomyolysis, CK is 4134, presently on bicarbonate infusion with every 6 hour arterial blood gases to make sure patient does not get too alkalotic. Will check also renal ultrasound  Electrolyte abnormalities, as noted, pharmacy for dosing coverage  Leukocytosis as of yesterday. Pending repeat CBC, on Zosyn  Critical care time 40 minutes  Name: George Arellano MRN: [id number] DOB: 24-Jul-1965    ADMISSION DATE:  07/18/2017  SUBJECTIVE:   Over the last 24 hours patient has required continuous BiPAP, also blood pressure has been labile, central line was placed and started on Neo-Synephrine. Presently he is awake alert and communicating, states that his breathing is still difficult.  VITAL SIGNS: Temp:  [97.9 F (36.6 C)-99.2 F (37.3 C)] 99 F (37.2 C) (01/08 0300) Pulse Rate:  [96-127] 96 (01/08 0600) Resp:   [18-31] 23 (01/08 0600) BP: (63-98)/(48-76) 80/66 (01/08 0700) SpO2:  [90 %-100 %] 100 % (01/08 0600) FiO2 (%):  [40 %-60 %] 50 % (01/08 0300) Weight:  [79.9 kg (176 lb 2.4 oz)] 79.9 kg (176 lb 2.4 oz) (01/08 0425)   PHYSICAL EXAMINATION: Physical Examination:   VS: BP (!) 80/66   Pulse 96   Temp 99 F (37.2 C) (Axillary)   Resp (!) 23   Ht 5\' 9"  (1.753 m)   Wt 79.9 kg (176 lb 2.4 oz)   SpO2 100%   BMI 26.01 kg/m   General Appearance: Respiratory distress noted however more comfortable than yesterday on continuous noninvasive ventilation Neuro:without focal findings, mental status normal. HEENT: Trachea is midline, no stridor appreciated Pulmonary: Coarse breath sounds with respiratory crackles appreciated on the left Cardiovascular tachycardia noted with regular rate.   Abdomen: Benign, Soft, non-tender. Skin:   warm, no rashes, no ecchymosis  Extremities: normal, no cyanosis, clubbing.   LABORATORY PANEL:   CBC Recent Labs  Lab 07/18/17 0658  WBC 16.9*  HGB 15.7  HCT 46.1  PLT 166    Chemistries  Recent Labs  Lab 07/18/17 0815  07/19/17 0531  NA 137   < > 138  K 5.1   < > 4.4  CL 101   < > 99*  CO2 17*   < > 25  GLUCOSE 164*   < > 149*  BUN 34*   < > 58*  CREATININE 5.23*   < > 5.01*  CALCIUM 7.3*   < > 6.4*  MG  --    < > 2.2  PHOS  --    < > 6.7*  AST  109*  --   --   ALT 37  --   --   ALKPHOS 30*  --   --   BILITOT 1.2  --   --    < > = values in this interval not displayed.    Recent Labs  Lab 07/18/17 0955 07/18/17 1941 07/18/17 2121 07/19/17 0317  GLUCAP 140* 71 174* 138*   Recent Labs  Lab 07/18/17 1235 07/19/17 0020 07/19/17 0528  PHART 7.30* 7.46* 7.45  PCO2ART 41 30* 36  PO2ART 86 75* 68*   Recent Labs  Lab 07/18/17 0658 07/18/17 0815  AST 51* 109*  ALT 20 37  ALKPHOS 18* 30*  BILITOT 0.6 1.2  ALBUMIN 1.3* 2.5*    Cardiac Enzymes Recent Labs  Lab 07/18/17 0658  TROPONINI <0.03    RADIOLOGY:  Dg Chest  Portable 1 View  Result Date: 07/18/2017 CLINICAL DATA:  Left-sided chest pain and shortness of breath. Elevated white blood cell count. History of CHF, COPD and hypertension. History of smoking. EXAM: PORTABLE CHEST 1 VIEW COMPARISON:  10/08/2015 FINDINGS: Interval development of a consolidative opacity with the left lung which obscures the left heart border. Otherwise, grossly unchanged cardiac silhouette and mediastinal contours. Atherosclerotic plaque when the thoracic aorta. Minimal right basilar opacities favored to represent atelectasis. No definite pleural effusion or pneumothorax. No evidence of edema. No acute osseus abnormalities. IMPRESSION: Findings worrisome for left mid lung pneumonia, especially in the setting of elevated white blood cell cuff, however given the consolidated appearance of the left mid lung opacity as patient's history of smoking, a follow-up chest radiograph in 3 to 4 weeks after treatment is recommended to ensure resolution. Electronically Signed   By: Sandi Mariscal M.D.   On: 07/18/2017 08:32   Hermelinda Dellen, DO 07/19/2017

## 2017-07-20 ENCOUNTER — Other Ambulatory Visit: Payer: Self-pay

## 2017-07-20 ENCOUNTER — Encounter: Payer: Self-pay | Admitting: *Deleted

## 2017-07-20 DIAGNOSIS — J9601 Acute respiratory failure with hypoxia: Secondary | ICD-10-CM

## 2017-07-20 DIAGNOSIS — E876 Hypokalemia: Secondary | ICD-10-CM

## 2017-07-20 LAB — BASIC METABOLIC PANEL
Anion gap: 14 (ref 5–15)
BUN: 64 mg/dL — ABNORMAL HIGH (ref 6–20)
CALCIUM: 7.1 mg/dL — AB (ref 8.9–10.3)
CO2: 34 mmol/L — ABNORMAL HIGH (ref 22–32)
CREATININE: 3.67 mg/dL — AB (ref 0.61–1.24)
Chloride: 92 mmol/L — ABNORMAL LOW (ref 101–111)
GFR, EST AFRICAN AMERICAN: 21 mL/min — AB (ref >60)
GFR, EST NON AFRICAN AMERICAN: 18 mL/min — AB (ref >60)
Glucose, Bld: 133 mg/dL — ABNORMAL HIGH (ref 65–99)
Potassium: 3.3 mmol/L — ABNORMAL LOW (ref 3.5–5.1)
SODIUM: 140 mmol/L (ref 135–145)

## 2017-07-20 LAB — GLUCOSE, CAPILLARY
GLUCOSE-CAPILLARY: 122 mg/dL — AB (ref 65–99)
GLUCOSE-CAPILLARY: 127 mg/dL — AB (ref 65–99)
Glucose-Capillary: 124 mg/dL — ABNORMAL HIGH (ref 65–99)
Glucose-Capillary: 130 mg/dL — ABNORMAL HIGH (ref 65–99)
Glucose-Capillary: 140 mg/dL — ABNORMAL HIGH (ref 65–99)

## 2017-07-20 LAB — URINE CULTURE: Culture: NO GROWTH

## 2017-07-20 LAB — PHOSPHORUS: Phosphorus: 5.9 mg/dL — ABNORMAL HIGH (ref 2.5–4.6)

## 2017-07-20 LAB — POTASSIUM: Potassium: 3.4 mmol/L — ABNORMAL LOW (ref 3.5–5.1)

## 2017-07-20 LAB — MAGNESIUM: MAGNESIUM: 2.1 mg/dL (ref 1.7–2.4)

## 2017-07-20 LAB — CK: CK TOTAL: 752 U/L — AB (ref 49–397)

## 2017-07-20 MED ORDER — METHYLPREDNISOLONE SODIUM SUCC 40 MG IJ SOLR: 40.0000 mg | Freq: Two times a day (BID) | INTRAMUSCULAR | Status: DC

## 2017-07-20 MED ORDER — METHYLPREDNISOLONE SODIUM SUCC 125 MG IJ SOLR
60.0000 mg | INTRAMUSCULAR | Status: DC
  Administered 2017-07-20 – 2017-07-25 (×6): 60 mg via INTRAVENOUS
  Filled 2017-07-20 (×6): qty 2

## 2017-07-20 MED ORDER — BUDESONIDE 0.25 MG/2ML IN SUSP: 0.2500 mg | Freq: Two times a day (BID) | RESPIRATORY_TRACT | Status: DC

## 2017-07-20 MED ORDER — POTASSIUM CHLORIDE 10 MEQ/100ML IV SOLN
10.0000 meq | INTRAVENOUS | Status: AC
  Administered 2017-07-20 (×2): 10 meq via INTRAVENOUS
  Filled 2017-07-20 (×2): qty 100

## 2017-07-20 NOTE — Progress Notes (Signed)
Patient was being managed with morphine and ativan throughout the night. Tremors were present off and on. At approx. 0630 patient's tremors became much worse and patient was pulling at his gown and cords, attempting to get out of bed. Bi-Pap removed and Precedex started and is currently at 0.70mcg/kg/min. Patient is calmed down a bit and is congested with audible congestion/wheezes. Respiratory notified.

## 2017-07-20 NOTE — Progress Notes (Signed)
[place] [facility] [place] NAME: George Arellano    MR#:  [id number]  DATE OF BIRTH:  [birth year]  SUBJECTIVE: Patient is off the BiPAP today morning, still requiring Precedex.  Critically ill on low-dose Neo-Synephrine, renal function slightly better than yesterday  CHIEF COMPLAINT:   Chief Complaint  Patient presents with  . Shortness of Breath  . Alcohol Intoxication    REVIEW OF SYSTEMS:   Review of Systems  Unable to perform ROS: Acuity of condition     DRUG ALLERGIES:   Allergies  Allergen Reactions  . Levaquin [Levofloxacin] Other (See Comments)    Reaction:  Unknown     VITALS:  Blood pressure 95/79, pulse (!) 102, temperature 97.6 F (36.4 C), temperature source Axillary, resp. rate (!) 22, height 5\' 9"  (1.753 m), weight 79.6 kg (175 lb 7.8 oz), SpO2 93 %.  PHYSICAL EXAMINATION:  GENERAL:  52 y.o.-year-old patient lying in the bed sedated with Precedex drip.  EYES: Pupils equal, round, reactive to light o scleral icterus. Extraocular muscles intact.  HEENT: Head atraumatic, normocephalic. Oropharynx and nasopharynx clear.  NECK:  Supple, no jugular venous distention. No thyroid enlargement, no tenderness.  LUNGS: Expiratory wheeze bilaterally.   No Rales,rhonchi or crepitation. No use of accessory muscles of respiration.  CARDIOVASCULAR: S1, S2 normal. No murmurs, rubs, or gallops.  ABDOMEN: Soft, nontender, nondistended. Bowel sounds present. No organomegaly or mass.  EXTREMITIES: No pedal edema, cyanosis, or clubbing.  NEUROLOGIC: Sedated.Marland Kitchen  PSYCHIATRIC: Sedated on Precedex drip. SKIN: No obvious rash, lesion, or ulcer.    LABORATORY PANEL:   CBC Recent Labs  Lab 07/19/17 0531  WBC 8.2  HGB 12.1*  HCT 35.4*  PLT 84*   ------------------------------------------------------------------------------------------------------------------  Chemistries  Recent Labs  Lab 07/18/17 0815  07/20/17 0358  07/20/17 0800  NA 137   < > 140  --   K 5.1   < > 3.3* 3.4*  CL 101   < > 92*  --   CO2 17*   < > 34*  --   GLUCOSE 164*   < > 133*  --   BUN 34*   < > 64*  --   CREATININE 5.23*   < > 3.67*  --   CALCIUM 7.3*   < > 7.1*  --   MG  --    < > 2.1  --   AST 109*  --   --   --   ALT 37  --   --   --   ALKPHOS 30*  --   --   --   BILITOT 1.2  --   --   --    < > = values in this interval not displayed.   ------------------------------------------------------------------------------------------------------------------  Cardiac Enzymes Recent Labs  Lab 07/18/17 0658  TROPONINI <0.03   ------------------------------------------------------------------------------------------------------------------  RADIOLOGY:  Ct Chest Wo Contrast  Result Date: 07/19/2017 CLINICAL DATA:  Respiratory distress.  Left-sided pneumonia. EXAM: CT CHEST WITHOUT CONTRAST TECHNIQUE: Multidetector CT imaging of the chest was performed following the standard protocol without IV contrast. COMPARISON:  07/18/2017 chest x-ray. FINDINGS: Cardiovascular: Coronary artery calcifications and aortic calcifications. No evidence of aortic aneurysm. Heart is mildly enlarged. Mediastinum/Nodes: Subcarinal adenopathy measuring 1.9 cm in short axis diameter. Borderline sized right paratracheal and AP window lymph nodes. No visible hilar adenopathy although evaluation is difficult without intravenous contrast. No axillary adenopathy. Lungs/Pleura: Dense consolidation in the left upper lobe, particularly lingula, right lower lobe, with  areas of consolidation also noted in the right upper lobe, right middle lobe and left lower lobe. Findings most compatible with multifocal pneumonia. Trace bilateral effusions. Upper Abdomen: Fatty infiltration of the liver. No acute findings in the upper abdomen. Musculoskeletal: Chest wall soft tissues are unremarkable. No acute bony abnormality. IMPRESSION: Areas of dense consolidation in the lungs  bilaterally, most confluent in the left upper lobe and right lower lobe compatible with multifocal pneumonia. Mild cardiomegaly. Coronary artery disease. Aortic Atherosclerosis (ICD10-I70.0). Fatty liver. Electronically Signed   By: Rolm Baptise M.D.   On: 07/19/2017 09:48    EKG:   Orders placed or performed during the hospital encounter of 07/18/17  . ED EKG 12-Lead  . ED EKG 12-Lead  . EKG 12-Lead  . EKG 12-Lead    ASSESSMENT AND PLAN:   Acute respiratory failure with hypoxia, hypercapnia due to pneumonia and COPD exacerbation: Patient on BiPAP at this time, continue IV antibiotics, bronchodilators.  2.  Septic shock present on admission: Patient is requiring Neo-Synephrine, IV fluids with bicarb drip.  Renal function slightly better today.   crDown to 3..  CK elevated to 265.  3.  Acute renal failure creatinine up to 5.  Monitor urine output, nephrology consulted, obtain renal ultrasound, mild rhabdomyolysis: Continue to check CK.  4.  Pneumonia: Continue vancomycin, Zosyn. Wbc decreasing.    5.  Severe hypokalemia, hypomagnesemia: Pharmacy consult for electrolye management.  5.  Heavy alcohol use, tobacco abuse: Continue alcohol withdrawal protocol with Precedex drip.  6.  GERD continue PPI,  #7 hypokalemia secondary to GI losses: Potassium improved after replacement.  DVT prophylaxis.  High risk for cardiac arrest is secondary to multiorgan failure with respiratory failure, renal failure, septic shock     All the records are reviewed and case discussed with Care Management/Social Workerr. Management plans discussed with the patient, family and they are in agreement.  CODE STATUS: full  TOTAL TIME TAKING CARE OF THIS PATIENT: 23minutes.      Epifanio Lesches M.D on 07/20/2017 at 2:49 PM  Between 7am to 6pm - Pager - [phone]  After 6pm go to [email or web address] - password [email or web address] [facility]  [facility]  Office  [phone]  CC: Primary care  physician; Lavonne Chick, MD   Note: This dictation was prepared with Dragon dictation along with smaller phrase technology. Any transcriptional errors that result from this process are unintentional.

## 2017-07-20 NOTE — Progress Notes (Signed)
George Arellano    SYNOPSIS   Patient with a known past medical history remarkable for alcoholism, congestive heart failure, COPD, hypertension, anxiety, presented with increasing shortness of breath since yesterday. When he presented to the emergency department he was also noted to be confused with altered mental status. He was noted to be hypoxic with a saturation of 84% on 6 L, was subsequently started on noninvasive ventilation and he is moved to the intensive care unit for further evaluation and management.  ASSESSMENT/PLAN   Respiratory distress. Patient with left-sided pneumonia, CT scan of the chest performed revealed dense consolidative multi lobar pneumonia without any evidence of pleural process, will continue Zosyn, albuterol, Atrovent and Solu-Medrol  Alcohol withdrawal. Has received thiamine, folic acid and multivitamins, presently on Precedex, CIWA with as needed benzodiazepine  Renal failure. Patient has had elevated CK and rhabdomyolysis. Has been on a bicarbonate infusion. His creatinine is decreasing to 3.67. We'll repeat CK and if decreasing we will DC bicarbonate infusion last blood gas was 7.49 and CO2 is increased to 34.   Leukocytosis resolved on Zosyn  Hypokalemia. We'll replace  Critical care time 40 minutes  Name: George Arellano MRN: [id number] DOB: Feb 06, 1966    ADMISSION DATE:  07/18/2017  SUBJECTIVE:   Over the last 24 hours patient has required continuous BiPAP, also blood pressure has been labile, central line was placed and started on Neo-Synephrine. Presently he is awake alert and communicating, states that his breathing is still difficult.  VITAL SIGNS: Temp:  [98 F (36.7 C)-99.8 F (37.7 C)] 98.5 F (36.9 C) (01/09 0400) Pulse Rate:  [93-108] 105 (01/09 0600) Resp:  [14-28] 20 (01/09 0600) BP: (71-108)/(37-90) 108/90 (01/09 0600) SpO2:  [90 %-100 %] 94 % (01/09 0600) FiO2 (%):  [28 %-36 %] 36 % (01/08  1933) Weight:  [79.6 kg (175 lb 7.8 oz)] 79.6 kg (175 lb 7.8 oz) (01/09 0311)   PHYSICAL EXAMINATION: Physical Examination:   VS: BP 108/90   Pulse (!) 105   Temp 98.5 F (36.9 C) (Axillary)   Resp 20   Ht 5\' 9"  (1.753 m)   Wt 79.6 kg (175 lb 7.8 oz)   SpO2 94%   BMI 25.91 kg/m   General Appearance: Respiratory distress noted however more comfortable than yesterday on continuous noninvasive ventilation Neuro:without focal findings, mental status normal. HEENT: Trachea is midline, no stridor appreciated Pulmonary: Coarse breath sounds with respiratory crackles appreciated on the left Cardiovascular tachycardia noted with regular rate.   Abdomen: Benign, Soft, non-tender. Skin:   warm, no rashes, no ecchymosis  Extremities: normal, no cyanosis, clubbing.   LABORATORY PANEL:   CBC Recent Labs  Lab 07/19/17 0531  WBC 8.2  HGB 12.1*  HCT 35.4*  PLT 84*    Chemistries  Recent Labs  Lab 07/18/17 0815  07/20/17 0358  NA 137   < > 140  K 5.1   < > 3.3*  CL 101   < > 92*  CO2 17*   < > 34*  GLUCOSE 164*   < > 133*  BUN 34*   < > 64*  CREATININE 5.23*   < > 3.67*  CALCIUM 7.3*   < > 7.1*  MG  --    < > 2.1  PHOS  --    < > 5.9*  AST 109*  --   --   ALT 37  --   --   ALKPHOS 30*  --   --  BILITOT 1.2  --   --    < > = values in this interval not displayed.    Recent Labs  Lab 07/19/17 0726 07/19/17 1109 07/19/17 1648 07/19/17 1924 07/19/17 2334 07/20/17 0320  GLUCAP 137* 141* 162* 154* 147* 124*   Recent Labs  Lab 07/19/17 0020 07/19/17 0528 07/19/17 1220  PHART 7.46* 7.45 7.49*  PCO2ART 30* 36 40  PO2ART 75* 68* 63*   Recent Labs  Lab 07/18/17 0658 07/18/17 0815  AST 51* 109*  ALT 20 37  ALKPHOS 18* 30*  BILITOT 0.6 1.2  ALBUMIN 1.3* 2.5*    Cardiac Enzymes Recent Labs  Lab 07/18/17 0658  TROPONINI <0.03    RADIOLOGY:  Ct Chest Wo Contrast  Result Date: 07/19/2017 CLINICAL DATA:  Respiratory distress.  Left-sided pneumonia.  EXAM: CT CHEST WITHOUT CONTRAST TECHNIQUE: Multidetector CT imaging of the chest was performed following the standard protocol without IV contrast. COMPARISON:  07/18/2017 chest x-ray. FINDINGS: Cardiovascular: Coronary artery calcifications and aortic calcifications. No evidence of aortic aneurysm. Heart is mildly enlarged. Mediastinum/Nodes: Subcarinal adenopathy measuring 1.9 cm in short axis diameter. Borderline sized right paratracheal and AP window lymph nodes. No visible hilar adenopathy although evaluation is difficult without intravenous contrast. No axillary adenopathy. Lungs/Pleura: Dense consolidation in the left upper lobe, particularly lingula, right lower lobe, with areas of consolidation also noted in the right upper lobe, right middle lobe and left lower lobe. Findings most compatible with multifocal pneumonia. Trace bilateral effusions. Upper Abdomen: Fatty infiltration of the liver. No acute findings in the upper abdomen. Musculoskeletal: Chest wall soft tissues are unremarkable. No acute bony abnormality. IMPRESSION: Areas of dense consolidation in the lungs bilaterally, most confluent in the left upper lobe and right lower lobe compatible with multifocal pneumonia. Mild cardiomegaly. Coronary artery disease. Aortic Atherosclerosis (ICD10-I70.0). Fatty liver. Electronically Signed   By: Rolm Baptise M.D.   On: 07/19/2017 09:48   Dg Chest Portable 1 View  Result Date: 07/18/2017 CLINICAL DATA:  Left-sided chest pain and shortness of breath. Elevated white blood cell count. History of CHF, COPD and hypertension. History of smoking. EXAM: PORTABLE CHEST 1 VIEW COMPARISON:  10/08/2015 FINDINGS: Interval development of a consolidative opacity with the left lung which obscures the left heart border. Otherwise, grossly unchanged cardiac silhouette and mediastinal contours. Atherosclerotic plaque when the thoracic aorta. Minimal right basilar opacities favored to represent atelectasis. No definite  pleural effusion or pneumothorax. No evidence of edema. No acute osseus abnormalities. IMPRESSION: Findings worrisome for left mid lung pneumonia, especially in the setting of elevated white blood cell cuff, however given the consolidated appearance of the left mid lung opacity as patient's history of smoking, a follow-up chest radiograph in 3 to 4 weeks after treatment is recommended to ensure resolution. Electronically Signed   By: Sandi Mariscal M.D.   On: 07/18/2017 08:32   Hermelinda Dellen, DO 07/20/2017

## 2017-07-20 NOTE — Progress Notes (Signed)
Pharmacy Electrolyte Monitoring Consult:  Pharmacy consulted to assist in monitoring and replacing electrolytes in this 52 y.o. male admitted on 07/18/2017 with Shortness of Breath and Alcohol Intoxication   Labs:  Sodium (mmol/L)  Date Value  07/20/2017 140  06/01/2014 136   Potassium (mmol/L)  Date Value  07/20/2017 3.4 (L)  06/01/2014 4.1   Magnesium (mg/dL)  Date Value  07/20/2017 2.1  11/23/2013 2.1   Phosphorus (mg/dL)  Date Value  07/20/2017 5.9 (H)   Calcium (mg/dL)  Date Value  07/20/2017 7.1 (L)   Calcium, Total (mg/dL)  Date Value  06/01/2014 8.5   Albumin (g/dL)  Date Value  07/18/2017 2.5 (L)  06/04/2014 2.6 (L)    Assessment/Plan: K replaced this AM. Bicarb drip also stopped. Will recheck electrolytes in AM.   [name] 07/20/2017 11:24 AM

## 2017-07-20 NOTE — Progress Notes (Signed)
Pharmacy Antibiotic Note  George Arellano is a 52 y.o. male admitted on 07/18/2017 with sepsis.  Pharmacy has been consulted for vancomycin and Zosyn dosing. Patient is in acute renal failure.   Plan: Continue Zosyn 3.375 g EI q 12 hours. If SCr continues to improve, will consider increasing Zosyn to q 8 hours.   Height: 5\' 9"  (175.3 cm) Weight: 175 lb 7.8 oz (79.6 kg) IBW/kg (Calculated) : 70.7  Temp (24hrs), Avg:98.5 F (36.9 C), Min:97.6 F (36.4 C), Max:99.8 F (37.7 C)  Recent Labs  Lab 07/18/17 0658 07/18/17 0718 07/18/17 0815 07/18/17 1047 07/18/17 1427 07/19/17 0531 07/19/17 0844 07/20/17 0358  WBC 16.9*  --   --   --   --  8.2  --   --   CREATININE 2.03*  --  5.23*  --  5.25* 5.01*  --  3.67*  LATICACIDVEN  --  9.8*  --  3.2*  --   --  2.8*  --   VANCORANDOM  --   --   --   --   --  21  --   --     Estimated Creatinine Clearance: 24.1 mL/min (A) (by C-G formula based on SCr of 3.67 mg/dL (H)).    Allergies  Allergen Reactions  . Levaquin [Levofloxacin] Other (See Comments)    Reaction:  Unknown     Antimicrobials this admission: Zosyn 1/7 >>  vancomycin 1/7   Dose adjustments this admission:   Microbiology results: 1/7 BCx: NGTD 1/7 UCx: NG 1/7 MRSA PCR: negative  Thank you for allowing pharmacy to be a part of this patient's care.  [name] 07/20/2017 1:44 PM

## 2017-07-20 NOTE — Progress Notes (Signed)
Lungs tight with wheezing throughout. Congested non productive cough. Off BIPAP and on 4L/[place] for 3 hrs.Reapplied for increased work of breathing with abdominal accessory muscle use. Unable to wean neo gtt off. Neo currently at 30 mcg. CIWA scale scores 4-9. Cam positive for delerium. Precedex was titrated up to 0.6. He follows most commands. Speech is slurred but less  than this am. Restlessness and mild agitation wax and wane. No hallucinations noted. UOP good. CPK and creatine improving.

## 2017-07-21 LAB — CBC
HEMATOCRIT: 37.2 % — AB (ref 40.0–52.0)
HEMOGLOBIN: 12.5 g/dL — AB (ref 13.0–18.0)
MCH: 34.4 pg — AB (ref 26.0–34.0)
MCHC: 33.8 g/dL (ref 32.0–36.0)
MCV: 102.1 fL — AB (ref 80.0–100.0)
Platelets: 72 10*3/uL — ABNORMAL LOW (ref 150–440)
RBC: 3.64 MIL/uL — AB (ref 4.40–5.90)
RDW: 12.7 % (ref 11.5–14.5)
WBC: 7.3 10*3/uL (ref 3.8–10.6)

## 2017-07-21 LAB — BASIC METABOLIC PANEL
Anion gap: 16 — ABNORMAL HIGH (ref 5–15)
BUN: 72 mg/dL — AB (ref 6–20)
CHLORIDE: 96 mmol/L — AB (ref 101–111)
CO2: 31 mmol/L (ref 22–32)
CREATININE: 2.86 mg/dL — AB (ref 0.61–1.24)
Calcium: 7.7 mg/dL — ABNORMAL LOW (ref 8.9–10.3)
GFR calc Af Amer: 28 mL/min — ABNORMAL LOW (ref >60)
GFR calc non Af Amer: 24 mL/min — ABNORMAL LOW (ref >60)
GLUCOSE: 147 mg/dL — AB (ref 65–99)
POTASSIUM: 3.6 mmol/L (ref 3.5–5.1)
SODIUM: 143 mmol/L (ref 135–145)

## 2017-07-21 LAB — GLUCOSE, CAPILLARY
GLUCOSE-CAPILLARY: 133 mg/dL — AB (ref 65–99)
GLUCOSE-CAPILLARY: 179 mg/dL — AB (ref 65–99)
GLUCOSE-CAPILLARY: 182 mg/dL — AB (ref 65–99)
Glucose-Capillary: 167 mg/dL — ABNORMAL HIGH (ref 65–99)
Glucose-Capillary: 168 mg/dL — ABNORMAL HIGH (ref 65–99)
Glucose-Capillary: 172 mg/dL — ABNORMAL HIGH (ref 65–99)

## 2017-07-21 MED ORDER — ENSURE ENLIVE PO LIQD
237.0000 mL | Freq: Four times a day (QID) | ORAL | Status: DC
  Administered 2017-07-22 – 2017-07-25 (×6): 237 mL via ORAL

## 2017-07-21 MED ORDER — LORAZEPAM 2 MG/ML IJ SOLN
1.0000 mg | INTRAMUSCULAR | Status: DC | PRN
  Administered 2017-07-21 – 2017-07-23 (×8): 2 mg via INTRAVENOUS
  Administered 2017-07-24: 1 mg via INTRAVENOUS
  Administered 2017-07-25: 2 mg via INTRAVENOUS
  Administered 2017-07-25: 1 mg via INTRAVENOUS
  Administered 2017-07-26 – 2017-07-27 (×3): 2 mg via INTRAVENOUS
  Filled 2017-07-21 (×15): qty 1

## 2017-07-21 MED ORDER — ENSURE ENLIVE PO LIQD: 237.0000 mL | Freq: Three times a day (TID) | ORAL | Status: DC

## 2017-07-21 MED ORDER — DEXTROSE-NACL 5-0.45 % IV SOLN
INTRAVENOUS | Status: DC
  Administered 2017-07-21: 11:00:00 via INTRAVENOUS

## 2017-07-21 MED ORDER — PIPERACILLIN-TAZOBACTAM 3.375 G IVPB
3.3750 g | Freq: Three times a day (TID) | INTRAVENOUS | Status: DC
  Administered 2017-07-21 – 2017-07-26 (×16): 3.375 g via INTRAVENOUS
  Filled 2017-07-21 (×16): qty 50

## 2017-07-21 NOTE — Progress Notes (Signed)
Pharmacy Electrolyte Monitoring Consult:  Pharmacy consulted to assist in monitoring and replacing electrolytes in this 52 y.o. male admitted on 07/18/2017 with Shortness of Breath and Alcohol Intoxication   Labs:  Sodium (mmol/L)  Date Value  07/21/2017 143  06/01/2014 136   Potassium (mmol/L)  Date Value  07/21/2017 3.6  06/01/2014 4.1   Magnesium (mg/dL)  Date Value  07/20/2017 2.1  11/23/2013 2.1   Phosphorus (mg/dL)  Date Value  07/20/2017 5.9 (H)   Calcium (mg/dL)  Date Value  07/21/2017 7.7 (L)   Calcium, Total (mg/dL)  Date Value  06/01/2014 8.5   Albumin (g/dL)  Date Value  07/18/2017 2.5 (L)  06/04/2014 2.6 (L)    Assessment/Plan: K remains WNL with patient off bicarb drip now. Will check BMET in AM and consider moving to less frequent lab checks.   [name] 07/21/2017 12:21 PM

## 2017-07-21 NOTE — Progress Notes (Signed)
[facility] Progess Note    SYNOPSIS   Patient with a known past medical history remarkable for alcoholism, congestive heart failure, COPD, hypertension, anxiety, presented with increasing shortness of breath since yesterday. When he presented to the emergency department he was also noted to be confused with altered mental status. He was noted to be hypoxic with a saturation of 84% on 6 L, was subsequently started on noninvasive ventilation and he is moved to the intensive care unit for further evaluation and management.  ASSESSMENT/PLAN   Respiratory distress. Patient with left-sided pneumonia, CT scan of the chest performed revealed dense consolidative multi lobar pneumonia without any evidence of pleural process, will continue Zosyn, albuterol, Atrovent and Solu-Medrol. Patient's status has not significantly improved has been on and off of noninvasive will continue to follow closely if worsens may require intubation  Alcohol withdrawal. Has received thiamine, folic acid and multivitamins, presently on Precedex, CIWA with as needed benzodiazepine  Renal failure. Patient has had elevated CK and rhabdomyolysis. Creatinine has decreased now to 2.86. Last measured CK sound and 700s.  Leukocytosis resolved on Zosyn   Critical care time 40 minutes  Name: George Arellano MRN: [id number] DOB: [birth year]    ADMISSION DATE:  07/18/2017  SUBJECTIVE:   Over the last 24 hours patient has required continuous BiPAP, also blood pressure has been labile, central line was placed and started on Neo-Synephrine. Presently he is awake alert and communicating, states that his breathing is still difficult.  VITAL SIGNS: Temp:  [97.5 F (36.4 C)-98.5 F (36.9 C)] 97.5 F (36.4 C) (01/10 0400) Pulse Rate:  [33-105] 101 (01/10 0600) Resp:  [16-33] 27 (01/10 0600) BP: (85-113)/(64-98) 99/86 (01/10 0600) SpO2:  [89 %-97 %] 93 % (01/10 0600) FiO2 (%):  [28 %-40 %] 28 % (01/10  0224) Weight:  [78.5 kg (173 lb 1 oz)] 78.5 kg (173 lb 1 oz) (01/10 0319)   PHYSICAL EXAMINATION: Physical Examination:   VS: BP 99/86   Pulse (!) 101   Temp (!) 97.5 F (36.4 C) (Axillary)   Resp (!) 27   Ht 5\' 9"  (1.753 m)   Wt 78.5 kg (173 lb 1 oz)   SpO2 93%   BMI 25.56 kg/m   General Appearance: Respiratory distress noted however more comfortable than yesterday on continuous noninvasive ventilation Neuro:without focal findings, mental status normal. HEENT: Trachea is midline, no stridor appreciated Pulmonary: Coarse breath sounds with respiratory crackles appreciated on the left Cardiovascular tachycardia noted with regular rate.   Abdomen: Benign, Soft, non-tender. Skin:   warm, no rashes, no ecchymosis  Extremities: normal, no cyanosis, clubbing.   LABORATORY PANEL:   CBC Recent Labs  Lab 07/21/17 0313  WBC 7.3  HGB 12.5*  HCT 37.2*  PLT 72*    Chemistries  Recent Labs  Lab 07/18/17 0815  07/20/17 0358  07/21/17 0313  NA 137   < > 140  --  143  K 5.1   < > 3.3*   < > 3.6  CL 101   < > 92*  --  96*  CO2 17*   < > 34*  --  31  GLUCOSE 164*   < > 133*  --  147*  BUN 34*   < > 64*  --  72*  CREATININE 5.23*   < > 3.67*  --  2.86*  CALCIUM 7.3*   < > 7.1*  --  7.7*  MG  --    < > 2.1  --   --  PHOS  --    < > 5.9*  --   --   AST 109*  --   --   --   --   ALT 37  --   --   --   --   ALKPHOS 30*  --   --   --   --   BILITOT 1.2  --   --   --   --    < > = values in this interval not displayed.    Recent Labs  Lab 07/20/17 0734 07/20/17 1108 07/20/17 2022 07/20/17 2323 07/21/17 0316 07/21/17 0730  GLUCAP 130* 140* 122* 127* 133* 167*   Recent Labs  Lab 07/19/17 0020 07/19/17 0528 07/19/17 1220  PHART 7.46* 7.45 7.49*  PCO2ART 30* 36 40  PO2ART 75* 68* 63*   Recent Labs  Lab 07/18/17 0658 07/18/17 0815  AST 51* 109*  ALT 20 37  ALKPHOS 18* 30*  BILITOT 0.6 1.2  ALBUMIN 1.3* 2.5*    Cardiac Enzymes Recent Labs  Lab  07/18/17 0658  TROPONINI <0.03    RADIOLOGY:  Ct Chest Wo Contrast  Result Date: 07/19/2017 CLINICAL DATA:  Respiratory distress.  Left-sided pneumonia. EXAM: CT CHEST WITHOUT CONTRAST TECHNIQUE: Multidetector CT imaging of the chest was performed following the standard protocol without IV contrast. COMPARISON:  07/18/2017 chest x-ray. FINDINGS: Cardiovascular: Coronary artery calcifications and aortic calcifications. No evidence of aortic aneurysm. Heart is mildly enlarged. Mediastinum/Nodes: Subcarinal adenopathy measuring 1.9 cm in short axis diameter. Borderline sized right paratracheal and AP window lymph nodes. No visible hilar adenopathy although evaluation is difficult without intravenous contrast. No axillary adenopathy. Lungs/Pleura: Dense consolidation in the left upper lobe, particularly lingula, right lower lobe, with areas of consolidation also noted in the right upper lobe, right middle lobe and left lower lobe. Findings most compatible with multifocal pneumonia. Trace bilateral effusions. Upper Abdomen: Fatty infiltration of the liver. No acute findings in the upper abdomen. Musculoskeletal: Chest wall soft tissues are unremarkable. No acute bony abnormality. IMPRESSION: Areas of dense consolidation in the lungs bilaterally, most confluent in the left upper lobe and right lower lobe compatible with multifocal pneumonia. Mild cardiomegaly. Coronary artery disease. Aortic Atherosclerosis (ICD10-I70.0). Fatty liver. Electronically Signed   By: Rolm Baptise M.D.   On: 07/19/2017 09:48   Hermelinda Dellen, DO 07/21/2017

## 2017-07-21 NOTE — Progress Notes (Signed)
Pharmacy Antibiotic Note  George Arellano is a 52 y.o. male admitted on 07/18/2017 with sepsis.  Pharmacy has been consulted for Zosyn dosing. Patient is in acute renal failure.   Plan: With continued improvement in SCr, will increase Zosyn dosing to 3.375 g EI q 8 hours.   Height: 5\' 9"  (175.3 cm) Weight: 173 lb 1 oz (78.5 kg) IBW/kg (Calculated) : 70.7  Temp (24hrs), Avg:98.1 F (36.7 C), Min:97.5 F (36.4 C), Max:98.6 F (37 C)  Recent Labs  Lab 07/18/17 0658 07/18/17 0718 07/18/17 0815 07/18/17 1047 07/18/17 1427 07/19/17 0531 07/19/17 0844 07/20/17 0358 07/21/17 0313  WBC 16.9*  --   --   --   --  8.2  --   --  7.3  CREATININE 2.03*  --  5.23*  --  5.25* 5.01*  --  3.67* 2.86*  LATICACIDVEN  --  9.8*  --  3.2*  --   --  2.8*  --   --   VANCORANDOM  --   --   --   --   --  21  --   --   --     Estimated Creatinine Clearance: 30.9 mL/min (A) (by C-G formula based on SCr of 2.86 mg/dL (H)).    Allergies  Allergen Reactions  . Levaquin [Levofloxacin] Other (See Comments)    Reaction:  Unknown     Antimicrobials this admission: Zosyn 1/7 >>  vancomycin 1/7   Dose adjustments this admission:   Microbiology results: 1/7 BCx: NGTD 1/7 UCx: NG 1/7 MRSA PCR: negative  Thank you for allowing pharmacy to be a part of this patient's care.  [name] 07/21/2017 12:09 PM

## 2017-07-21 NOTE — Progress Notes (Signed)
Pt is currently not as easily aroused.  Placed back on BIPAP 50% with sat 92%. Precedex decreased to 1.2 mcg/ kg/ hr. Lungs diminished with exp wheezes throughout. Has been restless/ agitated at intervals to today. During restless episodes he can become agitated, trying to get OOB. "gotta get the hell out of here",Swings at nurse when O2 cannula placed or if he is repositioned.He has had ativan 2mg  x2 today for increased agitation. Twice he knew he was at [facility] and month was January. Sometimes he will take long sips of po fluids.  UOP good. Creatinine improving.Father, mother and girlfriend call but do not visit. D51/2NS at 31ml/hr started due to poor po intake. Will try to get some ensure in when pt more alert.

## 2017-07-21 NOTE — Progress Notes (Signed)
Patient very anxious, attempting to pull off mask and push himself up and out of bed. Patient repositioned, mitts applied to patient and Ativan given as ordered PRN.

## 2017-07-22 LAB — CBC
HCT: 36.5 % — ABNORMAL LOW (ref 40.0–52.0)
Hemoglobin: 12.5 g/dL — ABNORMAL LOW (ref 13.0–18.0)
MCH: 35.1 pg — ABNORMAL HIGH (ref 26.0–34.0)
MCHC: 34.2 g/dL (ref 32.0–36.0)
MCV: 102.8 fL — ABNORMAL HIGH (ref 80.0–100.0)
PLATELETS: 58 10*3/uL — AB (ref 150–440)
RBC: 3.55 MIL/uL — ABNORMAL LOW (ref 4.40–5.90)
RDW: 12.6 % (ref 11.5–14.5)
WBC: 5 10*3/uL (ref 3.8–10.6)

## 2017-07-22 LAB — BASIC METABOLIC PANEL
ANION GAP: 13 (ref 5–15)
BUN: 88 mg/dL — ABNORMAL HIGH (ref 6–20)
CALCIUM: 8.1 mg/dL — AB (ref 8.9–10.3)
CO2: 34 mmol/L — AB (ref 22–32)
Chloride: 103 mmol/L (ref 101–111)
Creatinine, Ser: 2.32 mg/dL — ABNORMAL HIGH (ref 0.61–1.24)
GFR, EST AFRICAN AMERICAN: 36 mL/min — AB (ref >60)
GFR, EST NON AFRICAN AMERICAN: 31 mL/min — AB (ref >60)
Glucose, Bld: 180 mg/dL — ABNORMAL HIGH (ref 65–99)
Potassium: 3.3 mmol/L — ABNORMAL LOW (ref 3.5–5.1)
SODIUM: 150 mmol/L — AB (ref 135–145)

## 2017-07-22 LAB — GLUCOSE, CAPILLARY
GLUCOSE-CAPILLARY: 188 mg/dL — AB (ref 65–99)
GLUCOSE-CAPILLARY: 166 mg/dL — AB (ref 65–99)
GLUCOSE-CAPILLARY: 157 mg/dL — AB (ref 65–99)
GLUCOSE-CAPILLARY: 146 mg/dL — AB (ref 65–99)
Glucose-Capillary: 155 mg/dL — ABNORMAL HIGH (ref 65–99)

## 2017-07-22 LAB — MAGNESIUM: Magnesium: 2.2 mg/dL (ref 1.7–2.4)

## 2017-07-22 LAB — PHOSPHORUS: PHOSPHORUS: 5.7 mg/dL — AB (ref 2.5–4.6)

## 2017-07-22 MED ORDER — POTASSIUM CHLORIDE 10 MEQ/100ML IV SOLN
10.0000 meq | INTRAVENOUS | Status: AC
  Administered 2017-07-22 (×2): 10 meq via INTRAVENOUS
  Filled 2017-07-22 (×2): qty 100

## 2017-07-22 MED ORDER — DEXTROSE 5 % IV SOLN
INTRAVENOUS | Status: DC
  Administered 2017-07-22: 17:00:00 via INTRAVENOUS

## 2017-07-22 NOTE — Progress Notes (Signed)
[employer] at [place] NAME: George Arellano    MR#:  [id number]  DATE OF BIRTH:  1965-11-13  SUBJECTIVE: Patient is off off the BiPAP, on Precedex drip because of agitation.  CHIEF COMPLAINT:   Chief Complaint  Patient presents with  . Shortness of Breath  . Alcohol Intoxication    REVIEW OF SYSTEMS:   Review of Systems  Unable to perform ROS: Acuity of condition     DRUG ALLERGIES:   Allergies  Allergen Reactions  . Levaquin [Levofloxacin] Other (See Comments)    Reaction:  Unknown     VITALS:  Blood pressure (!) 146/102, pulse 89, temperature 97.7 F (36.5 C), temperature source Axillary, resp. rate 15, height 5\' 9"  (1.753 m), weight 82.3 kg (181 lb 7 oz), SpO2 93 %.  PHYSICAL EXAMINATION:  GENERAL:  52 y.o.-year-old patient lying in the bed sedated with Precedex drip.  EYES: Pupils equal, round, reactive to light o scleral icterus. Extraocular muscles intact.  HEENT: Head atraumatic, normocephalic. Oropharynx and nasopharynx clear.  NECK:  Supple, no jugular venous distention. No thyroid enlargement, no tenderness.  LUNGS: Expiratory wheeze bilaterally.   No Rales,rhonchi or crepitation. No use of accessory muscles of respiration.  CARDIOVASCULAR: S1, S2 normal. No murmurs, rubs, or gallops.  ABDOMEN: Soft, nontender, nondistended. Bowel sounds present. No organomegaly or mass.  EXTREMITIES: No pedal edema, cyanosis, or clubbing.  NEUROLOGIC: Sedated.Marland Kitchen  PSYCHIATRIC: Sedated on Precedex drip. SKIN: No obvious rash, lesion, or ulcer.    LABORATORY PANEL:   CBC Recent Labs  Lab 07/22/17 0401  WBC 5.0  HGB 12.5*  HCT 36.5*  PLT 58*   ------------------------------------------------------------------------------------------------------------------  Chemistries  Recent Labs  Lab 07/18/17 0815  07/22/17 0401  NA 137   < > 150*  K 5.1   < > 3.3*  CL 101   < > 103  CO2 17*   < > 34*  GLUCOSE 164*   < > 180*   BUN 34*   < > 88*  CREATININE 5.23*   < > 2.32*  CALCIUM 7.3*   < > 8.1*  MG  --    < > 2.2  AST 109*  --   --   ALT 37  --   --   ALKPHOS 30*  --   --   BILITOT 1.2  --   --    < > = values in this interval not displayed.   ------------------------------------------------------------------------------------------------------------------  Cardiac Enzymes Recent Labs  Lab 07/18/17 0658  TROPONINI <0.03   ------------------------------------------------------------------------------------------------------------------  RADIOLOGY:  No results found.  EKG:   Orders placed or performed during the hospital encounter of 07/18/17  . ED EKG 12-Lead  . ED EKG 12-Lead  . EKG 12-Lead  . EKG 12-Lead    ASSESSMENT AND PLAN:   Acute respiratory failure with hypoxia, hypercapnia due to pneumonia and COPD exacerbation:  clinically slowly improving, continue, bronchodilators, antibiotics, BiPAP.  But still needing Precedex.  Not significantly improved.  2.  Septic shock present on admission: Patient is requiring Neo-Synephrine, IV fluids with bicarb drip.  Renal function slightly better today.   crDown to 3..  CK elevated to 265.  Patient is  3.  Acute renal failure creatinine up to 5.  Patient creatinine is improving.  Urine output is improving.   4.  Pneumonia: Continue vancomycin, Zosyn. Wbc decreasing.    5.  Severe hypokalemia, hypomagnesemia: Pharmacy consult for electrolye management.  5.  Heavy alcohol  use, tobacco abuse: Continue alcohol withdrawal protocol with Precedex drip.  6.  GERD continue PPI,  #7 hypokalemia secondary to GI losses: Potassium improved after replacement.  DVT prophylaxis.  High risk for cardiac arrest is secondary to multiorgan failure with respiratory failure, renal failure, septic shock     All the records are reviewed and case discussed with Care Management/Social Workerr. Management plans discussed with the patient, family and they are in  agreement.  CODE STATUS: full  TOTAL TIME TAKING CARE OF THIS PATIENT: 17minutes.      Epifanio Lesches M.D on 07/22/2017 at 7:04 PM  Between 7am to 6pm - Pager - [phone]  After 6pm go to [email or web address] - password [email or web address] [facility]  [facility]  Office  [phone]  CC: Primary care physician; Lavonne Chick, MD   Note: This dictation was prepared with Dragon dictation along with smaller phrase technology. Any transcriptional errors that result from this process are unintentional.

## 2017-07-22 NOTE — Progress Notes (Addendum)
[facility] Progess Note    SYNOPSIS   Patient with a known past medical history remarkable for alcoholism, congestive heart failure, COPD, hypertension, anxiety, presented with increasing shortness of breath since yesterday. When he presented to the emergency department he was also noted to be confused with altered mental status. He was noted to be hypoxic with a saturation of 84% on 6 L, was subsequently started on noninvasive ventilation and he is moved to the intensive care unit for further evaluation and management.  ASSESSMENT/PLAN   Respiratory distress. Patient with left-sided pneumonia, CT scan of the chest performed revealed dense consolidative multi lobar pneumonia without any evidence of pleural process, will continue Zosyn, albuterol, Atrovent and Solu-Medrol. Patient's status has not significantly improved has been on and off of noninvasive will continue to follow closely if worsens may require intubation. Hopefully will be able to wean down on BiPAP today to nasal cannula  Alcohol withdrawal. Has received thiamine, folic acid and multivitamins, presently on Precedex, CIWA with as needed benzodiazepine  Renal failure. Patient has had elevated CK and rhabdomyolysis. Creatinine has decreased now to 2.86. Last measured CK sound and 700s.  Leukocytosis resolved on Zosyn  Hypokalemia at 3.3, will replace  Hypernatremia. Will change IV fluids to D5W   Name: George Arellano MRN: [id number] DOB: 1966-02-23    ADMISSION DATE:  07/18/2017  SUBJECTIVE:   Over the last 24 hours patient has required continuous BiPAP, also blood pressure has been labile, central line was placed and started on Neo-Synephrine. Presently he is awake alert and communicating, states that his breathing is still difficult.  VITAL SIGNS: Temp:  [97.5 F (36.4 C)-98.6 F (37 C)] 98 F (36.7 C) (01/11 0400) Pulse Rate:  [80-98] 80 (01/11 0500) Resp:  [13-26] 16 (01/11 0500) BP:  (104-142)/(77-108) 142/101 (01/11 0500) SpO2:  [85 %-97 %] 95 % (01/11 0500) FiO2 (%):  [35 %-50 %] 50 % (01/11 0217) Weight:  [82.3 kg (181 lb 7 oz)] 82.3 kg (181 lb 7 oz) (01/11 0409)   PHYSICAL EXAMINATION: Physical Examination:   VS: BP (!) 142/101   Pulse 80   Temp 98 F (36.7 C) (Axillary)   Resp 16   Ht 5\' 9"  (1.753 m)   Wt 82.3 kg (181 lb 7 oz)   SpO2 95%   BMI 26.79 kg/m   General Appearance: Respiratory distress noted however more comfortable than yesterday on continuous noninvasive ventilation Neuro:without focal findings, mental status normal. HEENT: Trachea is midline, no stridor appreciated Pulmonary: Coarse breath sounds with respiratory crackles appreciated on the left Cardiovascular tachycardia noted with regular rate.   Abdomen: Benign, Soft, non-tender. Skin:   warm, no rashes, no ecchymosis  Extremities: normal, no cyanosis, clubbing.   LABORATORY PANEL:   CBC Recent Labs  Lab 07/22/17 0401  WBC 5.0  HGB 12.5*  HCT 36.5*  PLT 58*    Chemistries  Recent Labs  Lab 07/18/17 0815  07/22/17 0401  NA 137   < > 150*  K 5.1   < > 3.3*  CL 101   < > 103  CO2 17*   < > 34*  GLUCOSE 164*   < > 180*  BUN 34*   < > 88*  CREATININE 5.23*   < > 2.32*  CALCIUM 7.3*   < > 8.1*  MG  --    < > 2.2  PHOS  --    < > 5.7*  AST 109*  --   --  ALT 37  --   --   ALKPHOS 30*  --   --   BILITOT 1.2  --   --    < > = values in this interval not displayed.    Recent Labs  Lab 07/21/17 0730 07/21/17 1120 07/21/17 1632 07/21/17 2036 07/21/17 2344 07/22/17 0359  GLUCAP 167* 172* 179* 182* 168* 188*   Recent Labs  Lab 07/19/17 0020 07/19/17 0528 07/19/17 1220  PHART 7.46* 7.45 7.49*  PCO2ART 30* 36 40  PO2ART 75* 68* 63*   Recent Labs  Lab 07/18/17 0658 07/18/17 0815  AST 51* 109*  ALT 20 37  ALKPHOS 18* 30*  BILITOT 0.6 1.2  ALBUMIN 1.3* 2.5*    Cardiac Enzymes Recent Labs  Lab 07/18/17 0658  TROPONINI <0.03    RADIOLOGY:  No  results found. Hermelinda Dellen, DO 07/22/2017

## 2017-07-22 NOTE — Progress Notes (Signed)
Pharmacy Electrolyte Monitoring Consult:  Pharmacy consulted to assist in monitoring and replacing electrolytes in this 52 y.o. male admitted on 07/18/2017 with Shortness of Breath and Alcohol Intoxication   Labs:  Sodium (mmol/L)  Date Value  07/22/2017 150 (H)  06/01/2014 136   Potassium (mmol/L)  Date Value  07/22/2017 3.3 (L)  06/01/2014 4.1   Magnesium (mg/dL)  Date Value  07/22/2017 2.2  11/23/2013 2.1   Phosphorus (mg/dL)  Date Value  07/22/2017 5.7 (H)   Calcium (mg/dL)  Date Value  07/22/2017 8.1 (L)   Calcium, Total (mg/dL)  Date Value  06/01/2014 8.5   Albumin (g/dL)  Date Value  07/18/2017 2.5 (L)  06/04/2014 2.6 (L)    Assessment/Plan: K slightly low today. Patient already had orders for KCl 46mEq IV x 2 runs. Will follow up on AM labs.  Paulina Fusi, PharmD, BCPS 07/22/2017 3:17 PM

## 2017-07-22 NOTE — Progress Notes (Signed)
[facility] at [place] NAME: George Arellano    MR#:  [id number]  DATE OF BIRTH:  12/30/1965  SUBJECTIVE: Still needing BiPAP on and off and also Precedex drip  CHIEF COMPLAINT:   Chief Complaint  Patient presents with  . Shortness of Breath  . Alcohol Intoxication    REVIEW OF SYSTEMS:   Review of Systems  Unable to perform ROS: Acuity of condition     DRUG ALLERGIES:   Allergies  Allergen Reactions  . Levaquin [Levofloxacin] Other (See Comments)    Reaction:  Unknown     VITALS:  Blood pressure (!) 146/102, pulse 89, temperature 97.7 F (36.5 C), temperature source Axillary, resp. rate 15, height 5\' 9"  (1.753 m), weight 82.3 kg (181 lb 7 oz), SpO2 93 %.  PHYSICAL EXAMINATION:  GENERAL:  52 y.o.-year-old patient lying in the bed sedated with Precedex drip.  EYES: Pupils equal, round, reactive to light o scleral icterus. Extraocular muscles intact.  HEENT: Head atraumatic, normocephalic. Oropharynx and nasopharynx clear.  NECK:  Supple, no jugular venous distention. No thyroid enlargement, no tenderness.  LUNGS: Expiratory wheeze bilaterally.   No Rales,rhonchi or crepitation. No use of accessory muscles of respiration.  CARDIOVASCULAR: S1, S2 normal. No murmurs, rubs, or gallops.  ABDOMEN: Soft, nontender, nondistended. Bowel sounds present. No organomegaly or mass.  EXTREMITIES: No pedal edema, cyanosis, or clubbing.  NEUROLOGIC: Sedated.Marland Kitchen  PSYCHIATRIC: Sedated on Precedex drip. SKIN: No obvious rash, lesion, or ulcer.    LABORATORY PANEL:   CBC Recent Labs  Lab 07/22/17 0401  WBC 5.0  HGB 12.5*  HCT 36.5*  PLT 58*   ------------------------------------------------------------------------------------------------------------------  Chemistries  Recent Labs  Lab 07/18/17 0815  07/22/17 0401  NA 137   < > 150*  K 5.1   < > 3.3*  CL 101   < > 103  CO2 17*   < > 34*  GLUCOSE 164*   < > 180*  BUN 34*   < >  88*  CREATININE 5.23*   < > 2.32*  CALCIUM 7.3*   < > 8.1*  MG  --    < > 2.2  AST 109*  --   --   ALT 37  --   --   ALKPHOS 30*  --   --   BILITOT 1.2  --   --    < > = values in this interval not displayed.   ------------------------------------------------------------------------------------------------------------------  Cardiac Enzymes Recent Labs  Lab 07/18/17 0658  TROPONINI <0.03   ------------------------------------------------------------------------------------------------------------------  RADIOLOGY:  No results found.  EKG:   Orders placed or performed during the hospital encounter of 07/18/17  . ED EKG 12-Lead  . ED EKG 12-Lead  . EKG 12-Lead  . EKG 12-Lead    ASSESSMENT AND PLAN:   Acute respiratory failure with hypoxia, hypercapnia due to pneumonia and COPD exacerbation:  clinically slowly improving, continue, bronchodilators, antibiotics, BiPAP.  But still needing Precedex.  Not significantly improved.  2.  Septic shock present on admission: Patient is requiring Neo-Synephrine, IV fluids with bicarb drip.  Renal function slightly better today.   crDown to 3..  CK elevated to 265.  Patient is  3.  Acute renal failure creatinine up to 5.  Patient creatinine is improving.  Urine output is improving.   4.  Pneumonia: Continue vancomycin, Zosyn. Wbc decreasing.    5.  Severe hypokalemia, hypomagnesemia: Pharmacy consult for electrolye management.  5.  Heavy alcohol use, tobacco  abuse: Continue alcohol withdrawal protocol with Precedex drip.  6.  GERD continue PPI,  #7 hypokalemia secondary to GI losses: Potassium improved after replacement.  DVT prophylaxis.  High risk for cardiac arrest is secondary to multiorgan failure with respiratory failure, renal failure, septic shock     All the records are reviewed and case discussed with Care Management/Social Workerr. Management plans discussed with the patient, family and they are in  agreement.  CODE STATUS: full  TOTAL TIME TAKING CARE OF THIS PATIENT: 43minutes.      Epifanio Lesches M.D on 07/22/2017 at 7:08 PM  Between 7am to 6pm - Pager - [phone]  After 6pm go to [email or web address] - password [email or web address] [facility]  [place]  Office  [phone]  CC: Primary care physician; Lavonne Chick, MD   Note: This dictation was prepared with Dragon dictation along with smaller phrase technology. Any transcriptional errors that result from this process are unintentional.

## 2017-07-22 NOTE — Progress Notes (Signed)
Updated Dr. Jefferson Fuel that patient is now on 4 liters nasal cannula- sating 91%- pt not as agitated- trying to wean off precedex.  BP diastolyic and MAP are over 100 around 105 Diastolyic and 115 MAP.  No new orders for the BP.  I notified Dr. Jefferson Fuel as well that the patient's central line looks as if is has pulled out slightly and is kinked though there is blood return on the proximal line - none on the medical and the distal is unable to flush.  MD ordered to start peripheral IV and pull and discontinue central line since patient does not need pressors.

## 2017-07-23 ENCOUNTER — Inpatient Hospital Stay: Payer: Medicaid Other

## 2017-07-23 LAB — GLUCOSE, CAPILLARY
GLUCOSE-CAPILLARY: 140 mg/dL — AB (ref 65–99)
GLUCOSE-CAPILLARY: 192 mg/dL — AB (ref 65–99)
GLUCOSE-CAPILLARY: 165 mg/dL — AB (ref 65–99)
Glucose-Capillary: 140 mg/dL — ABNORMAL HIGH (ref 65–99)
Glucose-Capillary: 151 mg/dL — ABNORMAL HIGH (ref 65–99)
Glucose-Capillary: 154 mg/dL — ABNORMAL HIGH (ref 65–99)

## 2017-07-23 LAB — CULTURE, BLOOD (ROUTINE X 2)
Culture: NO GROWTH
Culture: NO GROWTH
SPECIAL REQUESTS: ADEQUATE
Special Requests: ADEQUATE

## 2017-07-23 LAB — BASIC METABOLIC PANEL
ANION GAP: 10 (ref 5–15)
BUN: 76 mg/dL — AB (ref 6–20)
CO2: 32 mmol/L (ref 22–32)
Calcium: 8.4 mg/dL — ABNORMAL LOW (ref 8.9–10.3)
Chloride: 112 mmol/L — ABNORMAL HIGH (ref 101–111)
Creatinine, Ser: 1.88 mg/dL — ABNORMAL HIGH (ref 0.61–1.24)
GFR calc Af Amer: 46 mL/min — ABNORMAL LOW (ref >60)
GFR calc non Af Amer: 40 mL/min — ABNORMAL LOW (ref >60)
GLUCOSE: 152 mg/dL — AB (ref 65–99)
POTASSIUM: 2.9 mmol/L — AB (ref 3.5–5.1)
Sodium: 154 mmol/L — ABNORMAL HIGH (ref 135–145)

## 2017-07-23 LAB — CBC
HEMATOCRIT: 38.8 % — AB (ref 40.0–52.0)
HEMOGLOBIN: 13 g/dL (ref 13.0–18.0)
MCH: 34.8 pg — AB (ref 26.0–34.0)
MCHC: 33.4 g/dL (ref 32.0–36.0)
MCV: 104.1 fL — AB (ref 80.0–100.0)
Platelets: 66 10*3/uL — ABNORMAL LOW (ref 150–440)
RBC: 3.73 MIL/uL — ABNORMAL LOW (ref 4.40–5.90)
RDW: 12.7 % (ref 11.5–14.5)
WBC: 6.3 10*3/uL (ref 3.8–10.6)

## 2017-07-23 LAB — POTASSIUM: Potassium: 3.2 mmol/L — ABNORMAL LOW (ref 3.5–5.1)

## 2017-07-23 LAB — HEPARIN INDUCED PLATELET AB (HIT ANTIBODY): Heparin Induced Plt Ab: 0.203 OD (ref 0.000–0.400)

## 2017-07-23 MED ORDER — POTASSIUM CL IN DEXTROSE 5% 20 MEQ/L IV SOLN
20.0000 meq | INTRAVENOUS | Status: DC
  Administered 2017-07-23 – 2017-07-25 (×4): 20 meq via INTRAVENOUS
  Filled 2017-07-23 (×8): qty 1000

## 2017-07-23 MED ORDER — POTASSIUM CHLORIDE 10 MEQ/100ML IV SOLN
10.0000 meq | INTRAVENOUS | Status: AC
  Administered 2017-07-23 (×4): 10 meq via INTRAVENOUS
  Filled 2017-07-23 (×4): qty 100

## 2017-07-23 MED ORDER — LORAZEPAM 2 MG/ML IJ SOLN
2.0000 mg | Freq: Once | INTRAMUSCULAR | Status: AC
  Administered 2017-07-23: 2 mg via INTRAVENOUS

## 2017-07-23 MED ORDER — POTASSIUM CHLORIDE 10 MEQ/100ML IV SOLN
10.0000 meq | INTRAVENOUS | Status: AC
  Administered 2017-07-23 (×3): 10 meq via INTRAVENOUS
  Filled 2017-07-23 (×3): qty 100

## 2017-07-23 MED ORDER — POTASSIUM CHLORIDE 10 MEQ/100ML IV SOLN: 10.0000 meq | INTRAVENOUS | Status: DC

## 2017-07-23 NOTE — Progress Notes (Signed)
Pt vitals stable- on 4 L on oxygen- since 8am- tolerating well.  Pt became very agitated around lunch time when I tried to give him his ensure- he refused.  Before this episode I tried to wean off Precedex but had to put it back to max rate.  Foley in place urine output adequate.  Potassium replaced.

## 2017-07-23 NOTE — Progress Notes (Signed)
Pharmacy Electrolyte Monitoring Consult:  Pharmacy consulted to assist in monitoring and replacing electrolytes in this 52 y.o. male admitted on 07/18/2017 with Shortness of Breath and Alcohol Intoxication   Labs:  Sodium (mmol/L)  Date Value  07/23/2017 154 (H)  06/01/2014 136   Potassium (mmol/L)  Date Value  07/23/2017 3.2 (L)  06/01/2014 4.1   Magnesium (mg/dL)  Date Value  07/22/2017 2.2  11/23/2013 2.1   Phosphorus (mg/dL)  Date Value  07/22/2017 5.7 (H)   Calcium (mg/dL)  Date Value  07/23/2017 8.4 (L)   Calcium, Total (mg/dL)  Date Value  06/01/2014 8.5   Albumin (g/dL)  Date Value  07/18/2017 2.5 (L)  06/04/2014 2.6 (L)    Assessment/Plan: Will order K runs x 3. Patient also receiving D5W w/41mEq KCL @100mL /hr.  Recheck electrolytes with AM labs.   Pernell Dupre, PharmD, BCPS Clinical Pharmacist 07/23/2017 7:24 PM

## 2017-07-23 NOTE — Progress Notes (Signed)
Increased to 4l [place] after neb treatment.

## 2017-07-23 NOTE — Progress Notes (Signed)
[facility] at [place] NAME: Frazier Balfour    MR#:  [id number]  DATE OF BIRTH:  May 21, 1966  SUBJECTIVE: Still agitated, requiring BiPAP support on and off.  However he looks a little more coherent.  On Precedex drip.  CHIEF COMPLAINT:   Chief Complaint  Patient presents with  . Shortness of Breath  . Alcohol Intoxication    REVIEW OF SYSTEMS:   Review of Systems  Unable to perform ROS: Acuity of condition     DRUG ALLERGIES:   Allergies  Allergen Reactions  . Levaquin [Levofloxacin] Other (See Comments)    Reaction:  Unknown     VITALS:  Blood pressure (!) 153/102, pulse 91, temperature 98 F (36.7 C), temperature source Axillary, resp. rate 17, height 5\' 9"  (1.753 m), weight 79.6 kg (175 lb 7.8 oz), SpO2 93 %.  PHYSICAL EXAMINATION:  GENERAL:  52 y.o.-year-old patient lying in the bed sedated with Precedex drip.  EYES: Pupils equal, round, reactive to light o scleral icterus. Extraocular muscles intact.  HEENT: Head atraumatic, normocephalic. Oropharynx and nasopharynx clear.  NECK:  Supple, no jugular venous distention. No thyroid enlargement, no tenderness.  LUNGS: Less wheezing today.  Not using accessory muscles of respiration.  CARDIOVASCULAR: S1, S2 normal. No murmurs, rubs, or gallops.  ABDOMEN: Soft, nontender, nondistended. Bowel sounds present. No organomegaly or mass.  EXTREMITIES: No pedal edema, cyanosis, or clubbing.  NEUROLOGIC: Sedated.Marland Kitchen  PSYCHIATRIC: Sedated on Precedex drip. SKIN: No obvious rash, lesion, or ulcer.    LABORATORY PANEL:   CBC Recent Labs  Lab 07/23/17 0545  WBC 6.3  HGB 13.0  HCT 38.8*  PLT 66*   ------------------------------------------------------------------------------------------------------------------  Chemistries  Recent Labs  Lab 07/18/17 0815  07/22/17 0401 07/23/17 0545  NA 137   < > 150* 154*  K 5.1   < > 3.3* 2.9*  CL 101   < > 103 112*  CO2 17*   < > 34*  32  GLUCOSE 164*   < > 180* 152*  BUN 34*   < > 88* 76*  CREATININE 5.23*   < > 2.32* 1.88*  CALCIUM 7.3*   < > 8.1* 8.4*  MG  --    < > 2.2  --   AST 109*  --   --   --   ALT 37  --   --   --   ALKPHOS 30*  --   --   --   BILITOT 1.2  --   --   --    < > = values in this interval not displayed.   ------------------------------------------------------------------------------------------------------------------  Cardiac Enzymes Recent Labs  Lab 07/18/17 0658  TROPONINI <0.03   ------------------------------------------------------------------------------------------------------------------  RADIOLOGY:  Dg Chest Port 1 View  Result Date: 07/23/2017 CLINICAL DATA:  Pneumonia EXAM: PORTABLE CHEST 1 VIEW COMPARISON:  07/18/2017 FINDINGS: Or patchy bilateral airspace disease, worsened significantly since prior study. Mild cardiomegaly. No visible effusions. No acute bony abnormality. IMPRESSION: Worsening bilateral multifocal airspace disease/pneumonia. Electronically Signed   By: Rolm Baptise M.D.   On: 07/23/2017 11:12    EKG:   Orders placed or performed during the hospital encounter of 07/18/17  . ED EKG 12-Lead  . ED EKG 12-Lead  . EKG 12-Lead  . EKG 12-Lead    ASSESSMENT AND PLAN:   Acute respiratory failure with hypoxia, hypercapnia due to pneumonia and COPD exacerbation:  clinically slowly improving, continue, bronchodilators, antibiotics, BiPAP.  But still needing Precedex.  Not significantly improved.  2.  Septic shock present on admission: Patient is requiring Neo-Synephrine, IV fluids with bicarb drip.  Patient off Neo-Synephrine drip.  Also off bicarb drip.  Sepsis secondary to pneumonia is clearing.  Continue IV antibiotics.,  Blood cultures have been negative so far.   3.  Acute renal failure due to rhabdomyolysis, sepsis: Creatinine up to 5.  Patient creatinine is improving.,  Creatinine is down to 1.8.  Urine output is improving.   4.  Pneumonia: Continue  vancomycin, Zosyn. Wbc decreasing.    5.  Severe hypokalemia, hypomagnesemia: Pharmacy consult for electrolye management.  Patient still is hypokalemic.,  Now has hypernatremia with sodium up to 154.  Continue D5 water as per ICU physician recommendation. 5.  Heavy alcohol use, tobacco abuse: Continue alcohol withdrawal protocol with Precedex drip.  Hopefully able to wean off BiPAP to nasal cannula today, and watch for withdrawal symptoms, use Ativan as needed.  6.  GERD continue PPI,  #7 hypokalemia secondary to GI losses: Potassium improved after replacement.  DVT prophylaxis.  High risk for cardiac arrest is secondary to multiorgan failure with respiratory failure, renal failure, septic shock     All the records are reviewed and case discussed with Care Management/Social Workerr. Management plans discussed with the patient, family and they are in agreement.  CODE STATUS: full  TOTAL TIME TAKING CARE OF THIS PATIENT: 75minutes.      Epifanio Lesches M.D on 07/23/2017 at 12:42 PM  Between 7am to 6pm - Pager - [phone]  After 6pm go to [email or web address] - password [email or web address] [email or web address]  [place] [facility]  Office  [phone]  CC: Primary care physician; Lavonne Chick, MD   Note: This dictation was prepared with Dragon dictation along with smaller phrase technology. Any transcriptional errors that result from this process are unintentional.

## 2017-07-23 NOTE — Progress Notes (Signed)
[facility] Progess Note    SYNOPSIS   Patient with a known past medical history remarkable for alcoholism, congestive heart failure, COPD, hypertension, anxiety, presented with increasing shortness of breath since yesterday. When he presented to the emergency department he was also noted to be confused with altered mental status. He was noted to be hypoxic with a saturation of 84% on 6 L, was subsequently started on noninvasive ventilation and he is moved to the intensive care unit for further evaluation and management.  ASSESSMENT/PLAN   Respiratory distress. Patient with left-sided pneumonia, CT scan of the chest performed revealed dense consolidative multi lobar pneumonia without any evidence of pleural process, will continue Zosyn, albuterol, Atrovent and Solu-Medrol. Patient's status has not significantly improved has been on and off of noninvasive will continue to follow closely if worsens may require intubation. Hopefully will be able to wean down on BiPAP today to nasal cannula  Alcohol withdrawal. Has received thiamine, folic acid and multivitamins, presently on Precedex, CIWA with as needed benzodiazepine  Renal failure. Secondary to rhabdo, resolving.  Leukocytosis resolved  Hypokalemia 2.9 will replace  Hypernatremia. Will change IV fluids to D5W   Name: George Arellano MRN: [id number] DOB: 1965/08/07    ADMISSION DATE:  07/18/2017  SUBJECTIVE:   Over the last 24 hours patient has required continuous BiPAP, also blood pressure has been labile, central line was placed and started on Neo-Synephrine. Presently he is awake alert and communicating, states that his breathing is still difficult.  VITAL SIGNS: Temp:  [97.5 F (36.4 C)-99.1 F (37.3 C)] 97.6 F (36.4 C) (01/12 0400) Pulse Rate:  [79-95] 92 (01/12 0500) Resp:  [13-27] 15 (01/12 0500) BP: (137-160)/(92-109) 157/109 (01/12 0500) SpO2:  [88 %-97 %] 97 % (01/12 0500) FiO2 (%):  [50 %] 50 %  (01/12 0429) Weight:  [79.6 kg (175 lb 7.8 oz)] 79.6 kg (175 lb 7.8 oz) (01/12 0500)   PHYSICAL EXAMINATION: Physical Examination:   VS: BP (!) 157/109   Pulse 92   Temp 97.6 F (36.4 C) (Axillary)   Resp 15   Ht 5\' 9"  (1.753 m)   Wt 79.6 kg (175 lb 7.8 oz)   SpO2 97%   BMI 25.91 kg/m   General Appearance: Respiratory distress noted however more comfortable than yesterday on continuous noninvasive ventilation Neuro:without focal findings, mental status normal. HEENT: Trachea is midline, no stridor appreciated Pulmonary: Coarse breath sounds with respiratory crackles appreciated on the left Cardiovascular tachycardia noted with regular rate.   Abdomen: Benign, Soft, non-tender. Skin:   warm, no rashes, no ecchymosis  Extremities: normal, no cyanosis, clubbing.   LABORATORY PANEL:   CBC Recent Labs  Lab 07/23/17 0545  WBC 6.3  HGB 13.0  HCT 38.8*  PLT 66*    Chemistries  Recent Labs  Lab 07/18/17 0815  07/22/17 0401 07/23/17 0545  NA 137   < > 150* 154*  K 5.1   < > 3.3* 2.9*  CL 101   < > 103 112*  CO2 17*   < > 34* 32  GLUCOSE 164*   < > 180* 152*  BUN 34*   < > 88* 76*  CREATININE 5.23*   < > 2.32* 1.88*  CALCIUM 7.3*   < > 8.1* 8.4*  MG  --    < > 2.2  --   PHOS  --    < > 5.7*  --   AST 109*  --   --   --  ALT 37  --   --   --   ALKPHOS 30*  --   --   --   BILITOT 1.2  --   --   --    < > = values in this interval not displayed.    Recent Labs  Lab 07/22/17 0742 07/22/17 1124 07/22/17 1615 07/22/17 2001 07/23/17 0014 07/23/17 0416  GLUCAP 166* 157* 146* 155* 140* 151*   Recent Labs  Lab 07/19/17 0020 07/19/17 0528 07/19/17 1220  PHART 7.46* 7.45 7.49*  PCO2ART 30* 36 40  PO2ART 75* 68* 63*   Recent Labs  Lab 07/18/17 0658 07/18/17 0815  AST 51* 109*  ALT 20 37  ALKPHOS 18* 30*  BILITOT 0.6 1.2  ALBUMIN 1.3* 2.5*    Cardiac Enzymes Recent Labs  Lab 07/18/17 0658  TROPONINI <0.03    RADIOLOGY:  No results  found. Hermelinda Dellen, DO 07/23/2017

## 2017-07-23 NOTE — Progress Notes (Signed)
Pharmacy Electrolyte Monitoring Consult:  Pharmacy consulted to assist in monitoring and replacing electrolytes in this 52 y.o. male admitted on 07/18/2017 with Shortness of Breath and Alcohol Intoxication   Labs:  Sodium (mmol/L)  Date Value  07/23/2017 154 (H)  06/01/2014 136   Potassium (mmol/L)  Date Value  07/23/2017 2.9 (L)  06/01/2014 4.1   Magnesium (mg/dL)  Date Value  07/22/2017 2.2  11/23/2013 2.1   Phosphorus (mg/dL)  Date Value  07/22/2017 5.7 (H)   Calcium (mg/dL)  Date Value  07/23/2017 8.4 (L)   Calcium, Total (mg/dL)  Date Value  06/01/2014 8.5   Albumin (g/dL)  Date Value  07/18/2017 2.5 (L)  06/04/2014 2.6 (L)    Assessment/Plan: K runs x 4. Will ask CCM to put K in IVF. Will recheck K at 1800 and all electrolytes with AM labs.   Ulice Dash, PharmD Clinical Pharmacist  07/23/2017 12:53 PM

## 2017-07-24 LAB — BASIC METABOLIC PANEL
ANION GAP: 9 (ref 5–15)
BUN: 61 mg/dL — ABNORMAL HIGH (ref 6–20)
CALCIUM: 8.4 mg/dL — AB (ref 8.9–10.3)
CO2: 30 mmol/L (ref 22–32)
Chloride: 112 mmol/L — ABNORMAL HIGH (ref 101–111)
Creatinine, Ser: 1.34 mg/dL — ABNORMAL HIGH (ref 0.61–1.24)
GFR calc Af Amer: >60 mL/min (ref >60)
GLUCOSE: 135 mg/dL — AB (ref 65–99)
POTASSIUM: 4.1 mmol/L (ref 3.5–5.1)
SODIUM: 151 mmol/L — AB (ref 135–145)

## 2017-07-24 LAB — GLUCOSE, CAPILLARY
GLUCOSE-CAPILLARY: 128 mg/dL — AB (ref 65–99)
GLUCOSE-CAPILLARY: 178 mg/dL — AB (ref 65–99)
GLUCOSE-CAPILLARY: 190 mg/dL — AB (ref 65–99)
Glucose-Capillary: 127 mg/dL — ABNORMAL HIGH (ref 65–99)
Glucose-Capillary: 180 mg/dL — ABNORMAL HIGH (ref 65–99)
Glucose-Capillary: 156 mg/dL — ABNORMAL HIGH (ref 65–99)

## 2017-07-24 LAB — PHOSPHORUS: Phosphorus: 4 mg/dL (ref 2.5–4.6)

## 2017-07-24 LAB — MAGNESIUM: Magnesium: 1.7 mg/dL (ref 1.7–2.4)

## 2017-07-24 MED ORDER — MAGNESIUM SULFATE IN D5W 1-5 GM/100ML-% IV SOLN
1.0000 g | Freq: Once | INTRAVENOUS | Status: AC
  Administered 2017-07-24: 1 g via INTRAVENOUS
  Filled 2017-07-24: qty 100

## 2017-07-24 NOTE — Progress Notes (Signed)
[facility] Progess Note    SYNOPSIS   Patient with a known past medical history remarkable for alcoholism, congestive heart failure, COPD, hypertension, anxiety, presented with increasing shortness of breath since yesterday. When he presented to the emergency department he was also noted to be confused with altered mental status. He was noted to be hypoxic with a saturation of 84% on 6 L, was subsequently started on noninvasive ventilation and he is moved to the intensive care unit for further evaluation and management.  ASSESSMENT/PLAN   Respiratory distress. Patient with left-sided pneumonia, CT scan of the chest performed revealed dense consolidative multi lobar pneumonia without any evidence of pleural process, will continue Zosyn, albuterol, Atrovent and Solu-Medrol. Patient's status has not significantly improved has been on and off of noninvasive will continue to follow closely if worsens may require intubation. Hopefully will be able to wean down on BiPAP today to nasal cannula  Alcohol withdrawal. Has received thiamine, folic acid and multivitamins, presently on Precedex, CIWA with as needed benzodiazepine  Renal failure. Secondary to rhabdo, resolving.  Leukocytosis resolved  Hypernatremia. Slowly improving presently on D5W drip   Name: George Arellano MRN: [id number] DOB: 03/01/1966    ADMISSION DATE:  07/18/2017  SUBJECTIVE:   Over the last 24 hours patient has required continuous BiPAP, also blood pressure has been labile, central line was placed and started on Neo-Synephrine. Presently he is awake alert and communicating, states that his breathing is still difficult.  VITAL SIGNS: Temp:  [98 F (36.7 C)-98.7 F (37.1 C)] 98.4 F (36.9 C) (01/13 0400) Pulse Rate:  [72-95] 72 (01/13 0700) Resp:  [14-24] 15 (01/13 0700) BP: (138-162)/(89-110) 152/95 (01/13 0700) SpO2:  [89 %-98 %] 97 % (01/13 0700) FiO2 (%):  [50 %] 50 % (01/13  0600)   PHYSICAL EXAMINATION: Physical Examination:   VS: BP (!) 152/95   Pulse 72   Temp 98.4 F (36.9 C) (Axillary)   Resp 15   Ht 5\' 9"  (1.753 m)   Wt 79.6 kg (175 lb 7.8 oz)   SpO2 97%   BMI 25.91 kg/m   General Appearance: Respiratory distress noted however more comfortable than yesterday on continuous noninvasive ventilation Neuro:without focal findings, mental status normal. HEENT: Trachea is midline, no stridor appreciated Pulmonary: Coarse breath sounds with respiratory crackles appreciated on the left Cardiovascular tachycardia noted with regular rate.   Abdomen: Benign, Soft, non-tender. Skin:   warm, no rashes, no ecchymosis  Extremities: normal, no cyanosis, clubbing.   LABORATORY PANEL:   CBC Recent Labs  Lab 07/23/17 0545  WBC 6.3  HGB 13.0  HCT 38.8*  PLT 66*    Chemistries  Recent Labs  Lab 07/18/17 0815  07/24/17 0343  NA 137   < > 151*  K 5.1   < > 4.1  CL 101   < > 112*  CO2 17*   < > 30  GLUCOSE 164*   < > 135*  BUN 34*   < > 61*  CREATININE 5.23*   < > 1.34*  CALCIUM 7.3*   < > 8.4*  MG  --    < > 1.7  PHOS  --    < > 4.0  AST 109*  --   --   ALT 37  --   --   ALKPHOS 30*  --   --   BILITOT 1.2  --   --    < > = values in this interval not displayed.  Recent Labs  Lab 07/23/17 1130 07/23/17 1602 07/23/17 2202 07/24/17 0029 07/24/17 0411 07/24/17 0711  GLUCAP 192* 165* 154* 178* 128* 127*   Recent Labs  Lab 07/19/17 0020 07/19/17 0528 07/19/17 1220  PHART 7.46* 7.45 7.49*  PCO2ART 30* 36 40  PO2ART 75* 68* 63*   Recent Labs  Lab 07/18/17 0658 07/18/17 0815  AST 51* 109*  ALT 20 37  ALKPHOS 18* 30*  BILITOT 0.6 1.2  ALBUMIN 1.3* 2.5*    Cardiac Enzymes Recent Labs  Lab 07/18/17 0658  TROPONINI <0.03    RADIOLOGY:  Dg Chest Port 1 View  Result Date: 07/23/2017 CLINICAL DATA:  Pneumonia EXAM: PORTABLE CHEST 1 VIEW COMPARISON:  07/18/2017 FINDINGS: Or patchy bilateral airspace disease, worsened  significantly since prior study. Mild cardiomegaly. No visible effusions. No acute bony abnormality. IMPRESSION: Worsening bilateral multifocal airspace disease/pneumonia. Electronically Signed   By: Rolm Baptise M.D.   On: 07/23/2017 11:12   Hermelinda Dellen, DO 07/24/2017

## 2017-07-24 NOTE — Progress Notes (Signed)
[facility] at [place] NAME: George Arellano    MR#:  [id number]  DATE OF BIRTH:  18-Aug-1965  SUBJECTIVE:  patient is off the BiPAP, on nasal cannula.  But still agitated requiring Precedex drip.  Confused.  Earlier mentioned that having shortness of breath  CHIEF COMPLAINT:   Chief Complaint  Patient presents with  . Shortness of Breath  . Alcohol Intoxication  Admitted for septic shock secondary to pneumonia also found to have acute renal failure, alcohol withdrawal, COPD exacerbation, hypokalemia.  Still needing Precedex drip for sedation but able to come off the BiPAP.  REVIEW OF SYSTEMS:   Review of Systems  Unable to perform ROS: Acuity of condition     DRUG ALLERGIES:   Allergies  Allergen Reactions  . Levaquin [Levofloxacin] Other (See Comments)    Reaction:  Unknown     VITALS:  Blood pressure 131/82, pulse 77, temperature 99.3 F (37.4 C), temperature source Axillary, resp. rate 14, height 5\' 9"  (1.753 m), weight 79.6 kg (175 lb 7.8 oz), SpO2 93 %.  PHYSICAL EXAMINATION:  GENERAL:  52 y.o.-year-old patient lying in the bed sedated with Precedex drip.  EYES: Pupils equal, round, reactive to light o scleral icterus. Extraocular muscles intact.  HEENT: Head atraumatic, normocephalic. Oropharynx and nasopharynx clear.  NECK:  Supple, no jugular venous distention. No thyroid enlargement, no tenderness.  LUNGS:  bilateral wheezing in all lung fields.Marland Kitchen  CARDIOVASCULAR: S1, S2 normal. No murmurs, rubs, or gallops.  ABDOMEN: Soft, nontender, nondistended. Bowel sounds present. No organomegaly or mass.  EXTREMITIES: No pedal edema, cyanosis, or clubbing.  NEUROLOGIC: Sedated.Marland Kitchen  PSYCHIATRIC: Sedated on Precedex drip. SKIN: No obvious rash, lesion, or ulcer.    LABORATORY PANEL:   CBC Recent Labs  Lab 07/23/17 0545  WBC 6.3  HGB 13.0  HCT 38.8*  PLT 66*    ------------------------------------------------------------------------------------------------------------------  Chemistries  Recent Labs  Lab 07/18/17 0815  07/24/17 0343  NA 137   < > 151*  K 5.1   < > 4.1  CL 101   < > 112*  CO2 17*   < > 30  GLUCOSE 164*   < > 135*  BUN 34*   < > 61*  CREATININE 5.23*   < > 1.34*  CALCIUM 7.3*   < > 8.4*  MG  --    < > 1.7  AST 109*  --   --   ALT 37  --   --   ALKPHOS 30*  --   --   BILITOT 1.2  --   --    < > = values in this interval not displayed.   ------------------------------------------------------------------------------------------------------------------  Cardiac Enzymes Recent Labs  Lab 07/18/17 0658  TROPONINI <0.03   ------------------------------------------------------------------------------------------------------------------  RADIOLOGY:  Dg Chest Port 1 View  Result Date: 07/23/2017 CLINICAL DATA:  Pneumonia EXAM: PORTABLE CHEST 1 VIEW COMPARISON:  07/18/2017 FINDINGS: Or patchy bilateral airspace disease, worsened significantly since prior study. Mild cardiomegaly. No visible effusions. No acute bony abnormality. IMPRESSION: Worsening bilateral multifocal airspace disease/pneumonia. Electronically Signed   By: Rolm Baptise M.D.   On: 07/23/2017 11:12    EKG:   Orders placed or performed during the hospital encounter of 07/18/17  . ED EKG 12-Lead  . ED EKG 12-Lead  . EKG 12-Lead  . EKG 12-Lead    ASSESSMENT AND PLAN:   Acute respiratory failure with hypoxia, hypercapnia due to pneumonia and COPD exacerbation:  clinically slowly improving, continue,  bronchodilators, antibiotics,   But still needing Precedex.    2.  Septic shock present on admission: Patient is required Neo-Synephrine drip, bicarb drip.  off Neo-Synephrine drip.  Also off bicarb drip.  Sepsis secondary to pneumonia .  Continue IV antibiotics.,  Blood cultures have been negative so far. Chest x-ray yesterday showed worsening  pneumonia.  3.  Acute renal failure due to rhabdomyolysis, sepsis: Creatinine up to 5.  Patient creatinine is improving.,  Creatinine is down to 1.3.  Urine output is improving.   4.  Pneumonia: Continue vancomycin, Zosyn. Wbc decreasing.    5.  Severe hypokalemia, hypomagnesemia: Pharmacy consult for electrolye management.  Patient still is hypokalemic.,  Now has hypernatremia; started on D5 water sodium decreased from   154-151..  5.  Heavy alcohol use, tobacco abuse: Continue alcohol withdrawal protocol with Precedex drip.   6.  GERD continue PPI,  #7 hypokalemia secondary to GI losses: Potassium improved after replacement.  DVT prophylaxis.  High risk for cardiac arrest is secondary to multiorgan failure with respiratory failure, renal failure, septic shock     All the records are reviewed and case discussed with Care Management/Social Workerr. Management plans discussed with the patient, family and they are in agreement.  CODE STATUS: full  TOTAL TIME TAKING CARE OF THIS PATIENT: 91minutes.      Epifanio Lesches M.D on 07/24/2017 at 3:55 PM  Between 7am to 6pm - Pager - [phone]  After 6pm go to [email or web address] - password [email or web address] [facility]  [facility]  Office  [phone]  CC: Primary care physician; Lavonne Chick, MD   Note: This dictation was prepared with Dragon dictation along with smaller phrase technology. Any transcriptional errors that result from this process are unintentional.

## 2017-07-24 NOTE — Progress Notes (Signed)
Pharmacy Electrolyte Monitoring Consult:  Pharmacy consulted to assist in monitoring and replacing electrolytes in this 52 y.o. male admitted on 07/18/2017 with Shortness of Breath and Alcohol Intoxication   Labs:  Sodium (mmol/L)  Date Value  07/24/2017 151 (H)  06/01/2014 136   Potassium (mmol/L)  Date Value  07/24/2017 4.1  06/01/2014 4.1   Magnesium (mg/dL)  Date Value  07/24/2017 1.7  11/23/2013 2.1   Phosphorus (mg/dL)  Date Value  07/24/2017 4.0   Calcium (mg/dL)  Date Value  07/24/2017 8.4 (L)   Calcium, Total (mg/dL)  Date Value  06/01/2014 8.5   Albumin (g/dL)  Date Value  07/18/2017 2.5 (L)  06/04/2014 2.6 (L)    Assessment/Plan: Mg sulfate 1 g iv ordered per CCM. Will f/u AM labs.   Napoleon Form, PharmD, BCPS Clinical Pharmacist 07/24/2017 11:46 AM

## 2017-07-25 DIAGNOSIS — J189 Pneumonia, unspecified organism: Secondary | ICD-10-CM

## 2017-07-25 LAB — BASIC METABOLIC PANEL
ANION GAP: 8 (ref 5–15)
BUN: 46 mg/dL — ABNORMAL HIGH (ref 6–20)
CALCIUM: 8.1 mg/dL — AB (ref 8.9–10.3)
CO2: 28 mmol/L (ref 22–32)
CREATININE: 1.06 mg/dL (ref 0.61–1.24)
Chloride: 110 mmol/L (ref 101–111)
GFR calc Af Amer: >60 mL/min (ref >60)
GFR calc non Af Amer: >60 mL/min (ref >60)
GLUCOSE: 119 mg/dL — AB (ref 65–99)
Potassium: 3.8 mmol/L (ref 3.5–5.1)
Sodium: 146 mmol/L — ABNORMAL HIGH (ref 135–145)

## 2017-07-25 LAB — GLUCOSE, CAPILLARY
GLUCOSE-CAPILLARY: 103 mg/dL — AB (ref 65–99)
GLUCOSE-CAPILLARY: 157 mg/dL — AB (ref 65–99)
GLUCOSE-CAPILLARY: 135 mg/dL — AB (ref 65–99)
Glucose-Capillary: 129 mg/dL — ABNORMAL HIGH (ref 65–99)
Glucose-Capillary: 184 mg/dL — ABNORMAL HIGH (ref 65–99)

## 2017-07-25 LAB — MAGNESIUM: Magnesium: 1.5 mg/dL — ABNORMAL LOW (ref 1.7–2.4)

## 2017-07-25 MED ORDER — MAGNESIUM SULFATE 2 GM/50ML IV SOLN
2.0000 g | Freq: Once | INTRAVENOUS | Status: AC
  Administered 2017-07-25: 2 g via INTRAVENOUS
  Filled 2017-07-25: qty 50

## 2017-07-25 MED ORDER — POTASSIUM CL IN DEXTROSE 5% 20 MEQ/L IV SOLN
20.0000 meq | INTRAVENOUS | Status: DC
Start: 2017-07-25 — End: 2017-07-26
  Administered 2017-07-25 (×2): 20 meq via INTRAVENOUS
  Filled 2017-07-25 (×3): qty 1000

## 2017-07-25 MED ORDER — IPRATROPIUM BROMIDE 0.02 % IN SOLN
0.5000 mg | Freq: Four times a day (QID) | RESPIRATORY_TRACT | Status: DC
  Administered 2017-07-25 – 2017-07-28 (×11): 0.5 mg via RESPIRATORY_TRACT
  Filled 2017-07-25 (×12): qty 2.5

## 2017-07-25 MED ORDER — ENSURE ENLIVE PO LIQD
237.0000 mL | Freq: Three times a day (TID) | ORAL | Status: DC
  Administered 2017-07-26 – 2017-08-01 (×10): 237 mL via ORAL

## 2017-07-25 MED ORDER — BUDESONIDE 0.25 MG/2ML IN SUSP
0.2500 mg | Freq: Four times a day (QID) | RESPIRATORY_TRACT | Status: DC
  Administered 2017-07-25 – 2017-07-28 (×11): 0.25 mg via RESPIRATORY_TRACT
  Filled 2017-07-25 (×12): qty 2

## 2017-07-25 NOTE — Plan of Care (Signed)
  Progressing SLP Dysphagia Goals Patient will utilize recommended strategies Description Patient will utilize recommended strategies during swallow to increase swallowing safety with 07/25/2017 1115 - Progressing by Colon Flattery B, CCC-SLP Flowsheets Taken 07/25/2017 1115  Patient will utilize recommended strategies during swallow to increase swallowing safety with  min assist

## 2017-07-25 NOTE — Progress Notes (Signed)
Pharmacy Electrolyte Monitoring Consult:  Pharmacy consulted to assist in monitoring and replacing electrolytes in this 52 y.o. male admitted on 07/18/2017 with Shortness of Breath and Alcohol Intoxication   Labs:  Sodium (mmol/L)  Date Value  07/25/2017 146 (H)  06/01/2014 136   Potassium (mmol/L)  Date Value  07/25/2017 3.8  06/01/2014 4.1   Magnesium (mg/dL)  Date Value  07/25/2017 1.5 (L)  11/23/2013 2.1   Phosphorus (mg/dL)  Date Value  07/24/2017 4.0   Calcium (mg/dL)  Date Value  07/25/2017 8.1 (L)   Calcium, Total (mg/dL)  Date Value  06/01/2014 8.5   Albumin (g/dL)  Date Value  07/18/2017 2.5 (L)  06/04/2014 2.6 (L)    Assessment/Plan: Mg sulfate 2 g iv ordered per CCM. Will f/u AM labs.   Paulina Fusi, PharmD, BCPS 07/25/2017 9:07 AM

## 2017-07-25 NOTE — Evaluation (Signed)
Clinical/Bedside Swallow Evaluation Patient Details  Name: George Arellano MRN: [id number] Date of Birth: 01-Oct-1965  Today's Date: 07/25/2017 Time: SLP Start Time (ACUTE ONLY): 1010 SLP Stop Time (ACUTE ONLY): 1045 SLP Time Calculation (min) (ACUTE ONLY): 35 min  Past Medical History:  Past Medical History:  Diagnosis Date  . Anxiety   . CHF (congestive heart failure) ([place])   . COPD (chronic obstructive pulmonary disease) ([place])   . Hypertension    Past Surgical History: No past surgical history on file. HPI:  52 year old admitted 07/18/17 with SOB and AMS. PMH significant for anxiety, CHF, COPD, HTN. CXR = bilateral multifocal airspace disease/PNA.    Assessment / Plan / Recommendation Clinical Impression  Pt presents with edentulous oral cavity. Dentures present, however, pt declined having them placed. SLP provided oral care with suction. Oral motor strength/function appear to be adequate. Dry smokers-type cough noted throughout evaluation. Pt reports he has smoked for 40 years, and has had a cough for years.   Pt was given trials of thin liquid, puree, and solid consistencies. No change in manner or frequency of cough noted, however, pt is at increased risk for silent aspiration given COPD. Pt appeared to tolerate all po trials without overt s/s aspiration or decline in respiratory status.   Will change diet to dys 3/chopped meats, thin liquids. ST will follow for assessment of diet tolerance and education, and determine if objective study is warranted.    SLP Visit Diagnosis: Dysphagia, unspecified (R13.10)    Aspiration Risk  Mild aspiration risk;Moderate aspiration risk    Diet Recommendation Dysphagia 3 (Mech soft);Thin liquid   Liquid Administration via: Cup;Straw Medication Administration: Whole meds with puree Supervision: Patient able to self feed;Full supervision/cueing for compensatory strategies Compensations: Minimize environmental distractions;Slow rate;Small  sips/bites Postural Changes: Seated upright at 90 degrees    Other  Recommendations Oral Care Recommendations: Oral care QID   Follow up Recommendations (TBD)      Frequency and Duration min 1 x/week  1 week;2 weeks       Prognosis Prognosis for Safe Diet Advancement: Good      Swallow Study   General Date of Onset: 07/18/17 HPI: 52 year old admitted 07/18/17 with SOB and AMS. PMH significant for Type of Study: Bedside Swallow Evaluation Previous Swallow Assessment: none Diet Prior to this Study: Regular;Thin liquids Temperature Spikes Noted: No Respiratory Status: Nasal cannula History of Recent Intubation: No(BiPAP for last week) Behavior/Cognition: Alert;Cooperative;Confused;Pleasant mood Oral Cavity Assessment: Within Functional Limits Oral Care Completed by SLP: Yes Oral Cavity - Dentition: Edentulous Vision: Functional for self-feeding Self-Feeding Abilities: Able to feed self;Needs assist;Needs set up Patient Positioning: Upright in bed Baseline Vocal Quality: Normal Volitional Cough: Strong Volitional Swallow: Able to elicit    Oral/Motor/Sensory Function Overall Oral Motor/Sensory Function: Within functional limits   Ice Chips Ice chips: Within functional limits Presentation: Spoon   Thin Liquid Thin Liquid: Within functional limits Presentation: Cup;Self Fed;Straw    Nectar Thick Nectar Thick Liquid: Within functional limits Presentation: Cup;Self Fed   Honey Thick Honey Thick Liquid: Not tested   Puree Puree: Within functional limits Presentation: Self Fed;Spoon   Solid   GO   Solid: Within functional limits Presentation: Parksley B. Quentin Ore, [facility], [place] Speech Language Pathologist [id number]  Shonna Chock 07/25/2017,11:12 AM

## 2017-07-25 NOTE — Progress Notes (Signed)
Initial Nutrition Assessment  DOCUMENTATION CODES:   Not applicable  INTERVENTION:  Provide Ensure Enlive po TID, each supplement provides 350 kcal and 20 grams of protein.  Encouraged adequate intake of calories and protein from meals.  Continue MVI daily, thiamine 921 mg daily, folic acid 1 mg daily.  NUTRITION DIAGNOSIS:   Increased nutrient needs related to catabolic illness(COPD, cirrhosis) as evidenced by estimated needs.  GOAL:   Patient will meet greater than or equal to 90% of their needs   MONITOR:   PO intake, Supplement acceptance, Labs, Weight trends, I & O's  REASON FOR ASSESSMENT:   NPO/Clear Liquid Diet    ASSESSMENT:   52 year old male with PMHx of COPD, CHF, HTN, anxiety admitted with acute hypoxemic respiratory failure, multilobar PNA, alcoholic cirrhosis, delirium, AKI, hypernatremia.   -Patient now off BiPAP. Mentation improved, as well. Patient was NPO for 7 days, then SLP was consulted for today. -Following SLP evaluation diet was changed to dysphagia 3 with thin liquids.  Met with patient at bedside. He reports he is confused. However, patient is alert enough to participate in assessment. He reports he lives with his mother at home who has DM. They each prepare meals. He usually has 2-3 meals per day. He is ready to start eating. He endorses some nausea. He does not typically have any issues with chewing/swallowing. Reports he had diarrhea today.  UBW 170-175 lbs. Per weight from today patient is within normal weight range.  Medications reviewed and include: folic acid 1 mg daily, MVI daily, thiamine 100 mg daily, D5W with KCl 20 mEq/L at 50 mL/hr (60 grams dextrose; 204 kcal daily), Zosyn.  Labs reviewed: CBG 103-184, Sodium 146, BUN 46, Magnesium 1.5.  Patient does not meet criteria for malnutrition.  Discussed with RN and on rounds.  NUTRITION - FOCUSED PHYSICAL EXAM:    Most Recent Value  Orbital Region  No depletion  Upper Arm Region   No depletion  Thoracic and Lumbar Region  No depletion  Buccal Region  No depletion  Temple Region  No depletion  Clavicle Bone Region  No depletion  Clavicle and Acromion Bone Region  No depletion  Scapular Bone Region  No depletion  Dorsal Hand  No depletion  Patellar Region  No depletion  Anterior Thigh Region  No depletion  Posterior Calf Region  No depletion  Edema (RD Assessment)  Moderate [only in LUE]  Hair  Reviewed  Eyes  Reviewed  Mouth  Reviewed  Skin  Reviewed  Nails  Reviewed     Diet Order:  DIET DYS 3 Room service appropriate? Yes; Fluid consistency: Thin  EDUCATION NEEDS:   No education needs have been identified at this time  Skin:  Skin Assessment: Reviewed RN Assessment(scattered ecchymosis)  Last BM:  07/25/2017 - medium type 7  Height:   Ht Readings from Last 1 Encounters:  07/18/17 _0  (1.753 m)    Weight:   Wt Readings from Last 1 Encounters:  07/25/17 174 lb 9.7 oz (79.2 kg)    Ideal Body Weight:  72.7 kg  BMI:  Body mass index is 25.78 kg/m.  Estimated Nutritional Needs:   Kcal:  1975-2140 (MSJ x 1.2-1.3)  Protein:  80-95 grams (1-1.2 grams/kg)  Fluid:  2-2.1 L/day (1 mL/kcal)  Willey Blade, MS, RD, LDN Office: [phone] Pager: [phone] After Hours/Weekend Pager: [phone]

## 2017-07-25 NOTE — Progress Notes (Signed)
[place] at [place] NAME: George Arellano    MR#:  [id number]  DATE OF BIRTH:  25-Nov-1965    CHIEF COMPLAINT:   Chief Complaint  Patient presents with  . Shortness of Breath  . Alcohol Intoxication  Admitted for septic shock secondary to pneumonia also found to have acute renal failure, alcohol withdrawal, COPD exacerbation, hypokalemia.   Awake and alert today.  Off Precedex drip.  On 4 L oxygen.  REVIEW OF SYSTEMS:   Review of Systems  Unable to perform ROS: Acuity of condition   DRUG ALLERGIES:   Allergies  Allergen Reactions  . Levaquin [Levofloxacin] Other (See Comments)    Reaction:  Unknown    VITALS:  Blood pressure 117/78, pulse 84, temperature 97.8 F (36.6 C), temperature source Oral, resp. rate 13, height 5\' 9"  (1.753 m), weight 79.2 kg (174 lb 9.7 oz), SpO2 98 %.  PHYSICAL EXAMINATION:  GENERAL:  52 y.o.-year-old patient lying in the bed  EYES: Pupils equal, round, reactive to light o scleral icterus. Extraocular muscles intact.  HEENT: Head atraumatic, normocephalic. Oropharynx and nasopharynx clear.  NECK:  Supple, no jugular venous distention. No thyroid enlargement, no tenderness.  LUNGS:  bilateral wheezing in all lung fields.Marland Kitchen  CARDIOVASCULAR: S1, S2 normal. No murmurs, rubs, or gallops.  ABDOMEN: Soft, nontender, nondistended. Bowel sounds present. No organomegaly or mass.  EXTREMITIES: No pedal edema, cyanosis, or clubbing.  NEUROLOGIC: Awake and alert.  No focal deficits. PSYCHIATRIC: Flat affect SKIN: No obvious rash, lesion, or ulcer.   LABORATORY PANEL:   CBC Recent Labs  Lab 07/23/17 0545  WBC 6.3  HGB 13.0  HCT 38.8*  PLT 66*   ------------------------------------------------------------------------------------------------------------------  Chemistries  Recent Labs  Lab 07/25/17 0351  NA 146*  K 3.8  CL 110  CO2 28  GLUCOSE 119*  BUN 46*  CREATININE 1.06  CALCIUM 8.1*  MG  1.5*   ------------------------------------------------------------------------------------------------------------------  Cardiac Enzymes No results for input(s): TROPONINI in the last 168 hours. ------------------------------------------------------------------------------------------------------------------  RADIOLOGY:  No results found.  EKG:   Orders placed or performed during the hospital encounter of 07/18/17  . ED EKG 12-Lead  . ED EKG 12-Lead  . EKG 12-Lead  . EKG 12-Lead    ASSESSMENT AND PLAN:   * Acute hypoxic and hypercarbic respiratory failure  due to bilateral pneumonia and COPD exacerbation with septic shock Off pressors now.  IV antibiotics, steroids. On 5 L nasal cannula.  Not needing BiPAP. Cultures negative. Appreciate pulmonary help in the ICU.  *Acute kidney injury due to septic shock and rhabdomyolysis has resolved.  * Pneumonia: Continue  Zosyn. Wbc decreasing. Vancomycin stopped  *Acute toxic and metabolic encephalopathy Improving.  Off Precedex drip today.  *Alcohol abuse and withdrawal.  Improving  *  Severe hypokalemia, hypomagnesemia: Pharmacy consult for electrolye management.  Replace PO and IV PRN.  DVT prophylaxis.  All the records are reviewed and case discussed with Care Management/Social Worker Management plans discussed with the patient, family and they are in agreement.  CODE STATUS: FULL CODE  TOTAL TIME TAKING CARE OF THIS PATIENT: 35 minutes.   Neita Carp M.D on 07/25/2017 at 1:52 PM  Between 7am to 6pm - Pager - [phone]  After 6pm go to [email or web address] - password [email or web address] [facility]  [place] Hospitalists  Office  [phone]  CC: Primary care physician; Lavonne Chick, MD   Note: This dictation was prepared with Dragon dictation along with smaller phrase technology.  Any transcriptional errors that result from this process are unintentional.

## 2017-07-25 NOTE — Progress Notes (Signed)
No overt distress Mildly intermittently agitated Fully oriented  Vitals:   07/25/17 0800 07/25/17 0900 07/25/17 1000 07/25/17 1100  BP: 90/66 (!) 98/59 125/81 117/78  Pulse: 81 (!) 35 87 84  Resp: 15 14 16 13   Temp: 97.8 F (36.6 C)     TempSrc: Oral     SpO2: 94% 97% 97% 98%  Weight:      Height:      4 LPM [place]   Gen: NAD HEENT: NCAT, sclerae white, edentulous Neck: No LAN, no JVD noted Lungs: full BS, scattered expiratory wheezes Cardiovascular: Regular, normal rate, no M noted Abdomen: Soft, NT, +BS Ext: no C/C/E Neuro: No focal deficits Skin: No lesions noted  BMP Latest Ref Rng & Units 07/25/2017 07/24/2017 07/23/2017  Glucose 65 - 99 mg/dL 119(H) 135(H) -  BUN 6 - 20 mg/dL 46(H) 61(H) -  Creatinine 0.61 - 1.24 mg/dL 1.06 1.34(H) -  Sodium 135 - 145 mmol/L 146(H) 151(H) -  Potassium 3.5 - 5.1 mmol/L 3.8 4.1 3.2(L)  Chloride 101 - 111 mmol/L 110 112(H) -  CO2 22 - 32 mmol/L 28 30 -  Calcium 8.9 - 10.3 mg/dL 8.1(L) 8.4(L) -    CBC Latest Ref Rng & Units 07/23/2017 07/22/2017 07/21/2017  WBC 3.8 - 10.6 K/uL 6.3 5.0 7.3  Hemoglobin 13.0 - 18.0 g/dL 13.0 12.5(L) 12.5(L)  Hematocrit 40.0 - 52.0 % 38.8(L) 36.5(L) 37.2(L)  Platelets 150 - 440 K/uL 66(L) 58(L) 72(L)    CXR: No new film  IMPRESSION: Acute hypoxemic respiratory failure Multi lobar pneumonia Persistent bronchospasm Alcoholic cirrhosis Delirium, resolving AKI, resolved Hypernatremia, improving  PLAN/REC: Transfer to MedSurg with cardiac monitoring Continue Pip-Tazo Continue nebulized bronchodilators Nebulized steroids initiated 01/14 SLP eval 01/14 - rec DIII with thin liquids Continue supplemental folate and thiamine Continue lorazepam as needed Repeat CXR ordered for 01/15  Merton Border, MD PCCM service Mobile [phone] Pager [phone] 07/25/2017 1:06 PM

## 2017-07-26 ENCOUNTER — Inpatient Hospital Stay: Payer: Medicaid Other

## 2017-07-26 ENCOUNTER — Inpatient Hospital Stay (HOSPITAL_COMMUNITY)
Admit: 2017-07-26 | Discharge: 2017-07-26 | Disposition: A | Discharge status: Home / Self Care | Payer: Medicaid Other | Attending: Internal Medicine | Admitting: Internal Medicine

## 2017-07-26 DIAGNOSIS — J9601 Acute respiratory failure with hypoxia: Secondary | ICD-10-CM

## 2017-07-26 LAB — CBC
HCT: 37.2 % — ABNORMAL LOW (ref 40.0–52.0)
Hemoglobin: 12.5 g/dL — ABNORMAL LOW (ref 13.0–18.0)
MCH: 34.2 pg — ABNORMAL HIGH (ref 26.0–34.0)
MCHC: 33.7 g/dL (ref 32.0–36.0)
MCV: 101.4 fL — AB (ref 80.0–100.0)
PLATELETS: 96 10*3/uL — AB (ref 150–440)
RBC: 3.67 MIL/uL — AB (ref 4.40–5.90)
RDW: 12.5 % (ref 11.5–14.5)
WBC: 11.5 10*3/uL — AB (ref 3.8–10.6)

## 2017-07-26 LAB — COMPREHENSIVE METABOLIC PANEL
ALT: 81 U/L — AB (ref 17–63)
AST: 69 U/L — AB (ref 15–41)
Albumin: 2.4 g/dL — ABNORMAL LOW (ref 3.5–5.0)
Alkaline Phosphatase: 68 U/L (ref 38–126)
Anion gap: 7 (ref 5–15)
BUN: 31 mg/dL — AB (ref 6–20)
CHLORIDE: 103 mmol/L (ref 101–111)
CO2: 32 mmol/L (ref 22–32)
CREATININE: 1.18 mg/dL (ref 0.61–1.24)
Calcium: 8.4 mg/dL — ABNORMAL LOW (ref 8.9–10.3)
GFR calc Af Amer: >60 mL/min (ref >60)
Glucose, Bld: 108 mg/dL — ABNORMAL HIGH (ref 65–99)
Potassium: 3.2 mmol/L — ABNORMAL LOW (ref 3.5–5.1)
Sodium: 142 mmol/L (ref 135–145)
Total Bilirubin: 1.9 mg/dL — ABNORMAL HIGH (ref 0.3–1.2)
Total Protein: 5.9 g/dL — ABNORMAL LOW (ref 6.5–8.1)

## 2017-07-26 LAB — GLUCOSE, CAPILLARY
Glucose-Capillary: 94 mg/dL (ref 65–99)
Glucose-Capillary: 105 mg/dL — ABNORMAL HIGH (ref 65–99)

## 2017-07-26 LAB — MAGNESIUM: Magnesium: 1.5 mg/dL — ABNORMAL LOW (ref 1.7–2.4)

## 2017-07-26 MED ORDER — FUROSEMIDE 40 MG PO TABS
40.0000 mg | ORAL_TABLET | Freq: Once | ORAL | Status: AC
  Administered 2017-07-26: 10:00:00 40 mg via ORAL
  Filled 2017-07-26: qty 1

## 2017-07-26 MED ORDER — POTASSIUM CHLORIDE 20 MEQ PO PACK
40.0000 meq | PACK | Freq: Once | ORAL | Status: AC
  Administered 2017-07-26: 40 meq via ORAL
  Filled 2017-07-26: qty 2

## 2017-07-26 MED ORDER — MAGNESIUM SULFATE 2 GM/50ML IV SOLN
2.0000 g | Freq: Once | INTRAVENOUS | Status: AC
  Administered 2017-07-26: 10:00:00 2 g via INTRAVENOUS
  Filled 2017-07-26: qty 50

## 2017-07-26 MED ORDER — PIPERACILLIN-TAZOBACTAM 3.375 G IVPB
3.3750 g | Freq: Three times a day (TID) | INTRAVENOUS | Status: AC
  Administered 2017-07-26 – 2017-07-27 (×4): 3.375 g via INTRAVENOUS
  Filled 2017-07-26 (×4): qty 50

## 2017-07-26 MED ORDER — POTASSIUM CHLORIDE 20 MEQ PO PACK
40.0000 meq | PACK | Freq: Once | ORAL | Status: AC
  Administered 2017-07-26: 10:00:00 40 meq via ORAL
  Filled 2017-07-26: qty 2

## 2017-07-26 NOTE — Progress Notes (Signed)
Dr Darvin Neighbours notified that patient was having left side weakness and complaining with visual deficits. MD acknowledged and will place orders for CT of head.

## 2017-07-26 NOTE — Progress Notes (Signed)
[place] at [place] NAME: George Arellano    MR#:  [id number]  DATE OF BIRTH:  25-May-1966    CHIEF COMPLAINT:   Chief Complaint  Patient presents with  . Shortness of Breath  . Alcohol Intoxication  Admitted for septic shock secondary to pneumonia also found to have acute renal failure, alcohol withdrawal, COPD exacerbation, hypokalemia.    Awake today. On and off confusion.  REVIEW OF SYSTEMS:   Review of Systems  Unable to perform ROS: Acuity of condition   DRUG ALLERGIES:   Allergies  Allergen Reactions  . Levaquin [Levofloxacin] Other (See Comments)    Reaction:  Unknown    VITALS:  Blood pressure 137/84, pulse 93, temperature (!) 100.5 F (38.1 C), temperature source Oral, resp. rate 18, height 5\' 9"  (1.753 m), weight 79.2 kg (174 lb 9.7 oz), SpO2 95 %.  PHYSICAL EXAMINATION:  GENERAL:  52 y.o.-year-old patient lying in the bed   EYES: Pupils equal, round, reactive to light o scleral icterus. Extraocular muscles intact.  HEENT: Head atraumatic, normocephalic. Oropharynx and nasopharynx clear.  NECK:  Supple, no jugular venous distention. No thyroid enlargement, no tenderness.  LUNGS:  bilateral wheezing in all lung fields.Marland Kitchen  CARDIOVASCULAR: S1, S2 normal. No murmurs, rubs, or gallops.  ABDOMEN: Soft, nontender, nondistended. Bowel sounds present. No organomegaly or mass.  EXTREMITIES: No pedal edema, cyanosis, or clubbing.  NEUROLOGIC: Awake and alert.  No focal deficits. PSYCHIATRIC: Flat affect SKIN: No obvious rash, lesion, or ulcer.   LABORATORY PANEL:   CBC Recent Labs  Lab 07/26/17 0644  WBC 11.5*  HGB 12.5*  HCT 37.2*  PLT 96*   ------------------------------------------------------------------------------------------------------------------  Chemistries  Recent Labs  Lab 07/26/17 0644  NA 142  K 3.2*  CL 103  CO2 32  GLUCOSE 108*  BUN 31*  CREATININE 1.18  CALCIUM 8.4*  MG 1.5*  AST  69*  ALT 81*  ALKPHOS 68  BILITOT 1.9*   ------------------------------------------------------------------------------------------------------------------  Cardiac Enzymes No results for input(s): TROPONINI in the last 168 hours. ------------------------------------------------------------------------------------------------------------------  RADIOLOGY:  Dg Chest Port 1 View  Result Date: 07/26/2017 CLINICAL DATA:  Respiratory failure. EXAM: PORTABLE CHEST 1 VIEW COMPARISON:  07/23/2017.  CT 07/19/2017. FINDINGS: Mediastinum and hilar structures normal. Heart size normal. Bilateral pulmonary infiltrates/edema again noted. Partial clearing from prior exam. No pleural effusion or pneumothorax. IMPRESSION: Bilateral pulmonary infiltrates/edema again noted. Partial clearing from prior exam. Electronically Signed   By: Marcello Moores  Register   On: 07/26/2017 07:30    EKG:   Orders placed or performed during the hospital encounter of 07/18/17  . ED EKG 12-Lead  . ED EKG 12-Lead  . EKG 12-Lead  . EKG 12-Lead    ASSESSMENT AND PLAN:   * Acute hypoxic and hypercarbic respiratory failure  due to bilateral pneumonia and COPD exacerbation with septic shock Off pressors now.  IV antibiotics, steroids. On 2-3 L nasal cannula.  Not needing BiPAP. Cultures negative. Appreciate pulmonary help in the ICU.  * Acute kidney injury due to septic shock and rhabdomyolysis has resolved.  * Pneumonia: Continue  Zosyn.  Vancomycin stopped  *Acute toxic and metabolic encephalopathy Improving.  Off Precedex drip today. Ativan PRN  * Alcohol abuse and withdrawal.  Improving  *  Severe hypokalemia, hypomagnesemia: Pharmacy consult for electrolye management.  Replaced PO and IV PRN.  DVT prophylaxis.  All the records are reviewed and case discussed with Care Management/Social Worker Management plans discussed with the patient,  family and they are in agreement.  CODE STATUS: FULL CODE  TOTAL TIME  TAKING CARE OF THIS PATIENT: 35 minutes.   Neita Carp M.D on 07/26/2017 at 1:54 PM  Between 7am to 6pm - Pager - [phone]  After 6pm go to [email or web address] - password [email or web address] [facility]  [place] Hospitalists  Office  [phone]  CC: Primary care physician; Lavonne Chick, MD   Note: This dictation was prepared with Dragon dictation along with smaller phrase technology. Any transcriptional errors that result from this process are unintentional.

## 2017-07-26 NOTE — Evaluation (Signed)
Physical Therapy Evaluation Patient Details Name: George Arellano MRN: [id number] DOB: 05-23-1966 Today's Date: 07/26/2017   History of Present Illness  presented to ER secondary to AMS, worsening SOB; admitted with septic shock, severe sepsis related to PNA.  Initially requiring BiPAP, now weaned to 2L  Clinical Impression  Upon evaluation, patient alert and oriented to basic information; follows simple commands with increased time for processing.  Mod weakness, coordination and sensory deficits noted L UE > LE, + L UE drift/extinction with bimanual tasks; suspect mod degree of L-sided inattention, visual scanning deficits.  Deficits do not appear to be baseline, though patient unable to fully articulate.  L lateral lean noted in unsupported sitting, worsened with divided attention or fatigue; min assist for correction to midline in A/P plane.  Standing/OOB deferred due to newly noted deficits.  RN to contact/discuss with primary MD.  CT head ordered.  Will follow for results and modify POC as appropriate. Would benefit from skilled PT to address above deficits and promote optimal return to PLOF; recommend transition to STR upon discharge from acute hospitalization.      Follow Up Recommendations SNF    Equipment Recommendations       Recommendations for Other Services       Precautions / Restrictions Precautions Precautions: Fall Restrictions Weight Bearing Restrictions: No      Mobility  Bed Mobility Overal bed mobility: Needs Assistance Bed Mobility: Supine to Sit     Supine to sit: Mod assist     General bed mobility comments: assist for position, protection of L UE > LE, truncal elevation (with transition through R)  Transfers                 General transfer comment: OOB deferred due to newly noted deficits in L hemi-body; returned to bed with nursing to call attending physician  Ambulation/Gait             General Gait Details: OOB deferred due to newly  noted deficits in L hemi-body; returned to bed with nursing to call attending physician  Stairs            Wheelchair Mobility    Modified Rankin (Stroke Patients Only)       Balance Overall balance assessment: Needs assistance Sitting-balance support: No upper extremity supported;Feet supported Sitting balance-Leahy Scale: Fair Sitting balance - Comments: min assist for correction to midline in M/L plane (due to L lateral lean), worsened with divided attention or fatigue Postural control: Left lateral lean     Standing balance comment: OOB deferred due to newly noted deficits in L hemi-body; returned to bed with nursing to call attending physician                             Pertinent Vitals/Pain Pain Assessment: Faces Faces Pain Scale: Hurts a little bit Pain Location: abdomen, stomach Pain Descriptors / Indicators: Aching Pain Intervention(s): Limited activity within patient's tolerance;Monitored during session;Repositioned    Home Living Family/patient expects to be discharged to:: Private residence     Type of Home: House       Home Layout: Multi-level   Additional Comments: Patient confused, questionable historian.  Indicates he lives alone, though chart suggests living with parent prior to admission?  Reports multi-level home with 3-4 steps to access upper level; bed/bathroom on main level of home.  Will verify with family as available.    Prior Function Level of Independence: Independent  Comments: Again, questionable historian, but indicates baseline indep without assist device for ADLs, household and community distances; + driving.     Hand Dominance   Dominant Hand: Right    Extremity/Trunk Assessment   Upper Extremity Assessment Upper Extremity Assessment: (L UE grossly 3/5 with noted decrease in control/coordination, + drift and some degree of sensory deficit elbow distally.  R UE grossly 4+/5 throughout)    Lower  Extremity Assessment Lower Extremity Assessment: (L LE grossly 4-/5 throughout, denies sensory deficit, questionable babinski.  R LE grossly 4+/5 throughout)    Cervical / Trunk Assessment Cervical / Trunk Assessment: (mild elongation of L lateral trunk musculature)  Communication   Communication: No difficulties  Cognition Arousal/Alertness: (alertness fluctuates throughout session, but improves with transitio to upright) Behavior During Therapy: WFL for tasks assessed/performed Overall Cognitive Status: No family/caregiver present to determine baseline cognitive functioning                                 General Comments: oriented to self, location, month, year and president; follows simple commands; increased time for processing and task initiation.  Limited awareness/insight into deficits      General Comments      Exercises Other Exercises Other Exercises: Noted deficits in visual scanning (does not appear to attend visually to L of midline) with associated visual/perceptual deficits; positive line bisection testing and noted difficulty with reading/word recognition Other Exercises: Mild/mod inattention to L hemi-body and environment.   Assessment/Plan    PT Assessment Patient needs continued PT services  PT Problem List Decreased strength;Decreased range of motion;Decreased activity tolerance;Decreased balance;Decreased mobility;Decreased coordination;Decreased cognition;Decreased knowledge of use of DME;Decreased safety awareness;Decreased knowledge of precautions       PT Treatment Interventions DME instruction;Gait training;Stair training;Functional mobility training;Therapeutic activities;Therapeutic exercise;Balance training;Neuromuscular re-education;Cognitive remediation(check head CT)    PT Goals (Current goals can be found in the Care Plan section)  Acute Rehab PT Goals Patient Stated Goal: to go back to [facility] PT Goal Formulation: With  patient Time For Goal Achievement: 08/09/17 Potential to Achieve Goals: Good    Frequency Min 2X/week   Barriers to discharge Decreased caregiver support      Co-evaluation               AM-PAC PT "6 Clicks" Daily Activity  Outcome Measure Difficulty turning over in bed (including adjusting bedclothes, sheets and blankets)?: Unable Difficulty moving from lying on back to sitting on the side of the bed? : Unable Difficulty sitting down on and standing up from a chair with arms (e.g., wheelchair, bedside commode, etc,.)?: Unable Help needed moving to and from a bed to chair (including a wheelchair)?: A Lot Help needed walking in hospital room?: A Lot Help needed climbing 3-5 steps with a railing? : Total 6 Click Score: 8    End of Session Equipment Utilized During Treatment: Gait belt Activity Tolerance: Treatment limited secondary to medical complications (Comment)(newly noted deficits in L hemi-body) Patient left: in bed;with call bell/phone within reach;with bed alarm set Nurse Communication: Mobility status PT Visit Diagnosis: Muscle weakness (generalized) (M62.81);Difficulty in walking, not elsewhere classified (R26.2)    Time: 7564-3329 PT Time Calculation (min) (ACUTE ONLY): 24 min   Charges:   PT Evaluation $PT Eval Moderate Complexity: 1 Mod PT Treatments $Therapeutic Activity: 8-22 mins   PT G Codes:        Pervis Macintyre H. Owens Shark, PT, DPT, NCS 07/26/17, 3:25 PM  [phone]    

## 2017-07-26 NOTE — Progress Notes (Addendum)
* [facility]     Assessment and Plan: Acute hypoxemic respiratory failure, due to pneumonia/acute exacerbation of COPD.  Patient has been weaned down to 2 L nasal cannula.  Underlying alcoholic cirrhosis. Deconditioning/ Weakness.   Advance activity as tolerated.  Incentive spirometry.  Bipap qhs to help with lung expansion.  Continue to wean down antibiotics. Continue Pip-Tazo for total 7-day course. Outpatient pulmonary and sleep follow-up. Pt was given my card and asked to follow up outpatient.   Antibiotics: Zosyn 1/10>>  Micro: MRSA PCR negative 07/18/17 Blood culture -07/18/17  x 2. Urine culture negative 07/18/17    Date: 07/26/2017  MRN# [id number] George Arellano 01-Mar-1966   George Arellano is a 52 y.o. old male seen in follow up for chief complaint of  Chief Complaint  Patient presents with  . Shortness of Breath  . Alcohol Intoxication     HPI:   Pt awake and alert, appears very fatigued today.  No new complaints.  Patient tells me that he does not see an outpatient pulmonologist, he tells me he does not know if he has been diagnosed with sleep apnea.  However he notes that he uses his mother's CPAP once or twice a week, and it makes his breathing feel better and he is more awake during the day.  Imaging personally reviewed, chest x-ray 07/26/17; continued lower middle lobe, left lingular infiltrate, CT chest 07/19/17, shows presence of pneumonia and above-mentioned segments, apical emphysema.   Medication:    Current Facility-Administered Medications:  .  acetaminophen (TYLENOL) tablet 650 mg, 650 mg, Oral, Q6H PRN **OR** acetaminophen (TYLENOL) suppository 650 mg, 650 mg, Rectal, Q6H PRN, Epifanio Lesches, MD .  budesonide (PULMICORT) nebulizer solution 0.25 mg, 0.25 mg, Nebulization, Q6H, Wilhelmina Mcardle, MD, 0.25 mg at 07/26/17 0745 .  chlorhexidine (PERIDEX) 0.12 % solution 15 mL, 15 mL, Mouth Rinse, BID, Tukov, Magadalene S, NP, 15 mL at  07/25/17 2200 .  feeding supplement (ENSURE ENLIVE) (ENSURE ENLIVE) liquid 237 mL, 237 mL, Oral, TID BM, Sudini, Srikar, MD, 237 mL at 07/26/17 0937 .  folic acid injection 1 mg, 1 mg, Intravenous, Daily, Epifanio Lesches, MD, 1 mg at 07/25/17 1143 .  HYDROcodone-acetaminophen (NORCO/VICODIN) 5-325 MG per tablet 1-2 tablet, 1-2 tablet, Oral, Q4H PRN, Epifanio Lesches, MD, 2 tablet at 07/19/17 1935 .  ipratropium (ATROVENT) nebulizer solution 0.5 mg, 0.5 mg, Nebulization, Q6H, Wilhelmina Mcardle, MD, 0.5 mg at 07/26/17 0745 .  levalbuterol (XOPENEX) nebulizer solution 0.63 mg, 0.63 mg, Nebulization, Q6H, Epifanio Lesches, MD, 0.63 mg at 07/26/17 0745 .  LORazepam (ATIVAN) injection 1-2 mg, 1-2 mg, Intravenous, Q4H PRN, Patria Mane, Magadalene S, NP, 2 mg at 07/26/17 0934 .  magnesium sulfate IVPB 2 g 50 mL, 2 g, Intravenous, Once, Hallaji, Sheema M, RPH, Last Rate: 50 mL/hr at 07/26/17 0942, 2 g at 07/26/17 0942 .  MEDLINE mouth rinse, 15 mL, Mouth Rinse, q12n4p, Tukov, Magadalene S, NP, 15 mL at 07/25/17 1632 .  multivitamin with minerals tablet 1 tablet, 1 tablet, Oral, Daily, Conforti, John, DO, 1 tablet at 07/26/17 0932 .  [DISCONTINUED] ondansetron (ZOFRAN) tablet 4 mg, 4 mg, Oral, Q6H PRN **OR** ondansetron (ZOFRAN) injection 4 mg, 4 mg, Intravenous, Q6H PRN, Epifanio Lesches, MD, 4 mg at 07/26/17 0045 .  piperacillin-tazobactam (ZOSYN) IVPB 3.375 g, 3.375 g, Intravenous, Q8H, Epifanio Lesches, MD, Last Rate: 12.5 mL/hr at 07/26/17 0706, 3.375 g at 07/26/17 0706 .  thiamine (B-1) injection 100 mg, 100 mg, Intravenous, Daily, [place], [place],  MD, 100 mg at 07/26/17 3329   Allergies:  Levaquin [levofloxacin]  Review of Systems: Gen:  Denies  fever, sweats. HEENT: Denies blurred vision. Cvc:  No dizziness, chest pain or heaviness Resp:   Denies cough or sputum porduction. Gi: Denies swallowing difficulty, stomach pain. constipation, bowel incontinence Gu:  Denies bladder  incontinence, burning urine Ext:   No Joint pain, stiffness. Skin: No skin rash, easy bruising. Endoc:  No polyuria, polydipsia. Psych: No depression, insomnia. Other:  All other systems were reviewed and found to be negative other than what is mentioned in the HPI.   Physical Examination:   VS: BP 137/84 (BP Location: Left Arm)   Pulse 93   Temp (!) 100.5 F (38.1 C) (Oral)   Resp 18   Ht 5\' 9"  (1.753 m)   Wt 174 lb 9.7 oz (79.2 kg)   SpO2 98%   BMI 25.78 kg/m    General Appearance: No distress  Neuro:without focal findings,  speech normal,  HEENT: PERRLA, EOM intact. Pulmonary: normal breath sounds, No wheezing.  Decreased air entry bilaterally. CardiovascularNormal S1,S2.  No m/r/g.   Abdomen: Benign, Soft, non-tender. Renal:  No costovertebral tenderness  GU:  Not performed at this time. Endoc: No evident thyromegaly, no signs of acromegaly. Skin:   warm, no rash. Extremities: normal, no cyanosis, clubbing.   LABORATORY PANEL:   CBC Recent Labs  Lab 07/26/17 0644  WBC 11.5*  HGB 12.5*  HCT 37.2*  PLT 96*   ------------------------------------------------------------------------------------------------------------------  Chemistries  Recent Labs  Lab 07/26/17 0644  NA 142  K 3.2*  CL 103  CO2 32  GLUCOSE 108*  BUN 31*  CREATININE 1.18  CALCIUM 8.4*  MG 1.5*  AST 69*  ALT 81*  ALKPHOS 68  BILITOT 1.9*   ------------------------------------------------------------------------------------------------------------------  Cardiac Enzymes No results for input(s): TROPONINI in the last 168 hours. ------------------------------------------------------------  RADIOLOGY:   No results found for this or any previous visit. Results for orders placed during the hospital encounter of 05/05/15  DG Chest 2 View   Narrative CLINICAL DATA:  Cough, shortness of breath and nausea for 3 days, history hypertension, CHF, COPD, smoker  EXAM: CHEST  2  VIEW  COMPARISON:  09/25/2014  FINDINGS: Normal heart size, mediastinal contours and pulmonary vascularity.  Increased perihilar markings bilaterally question infiltrate.  No pleural effusion or pneumothorax.  Bones unremarkable.  Mild atherosclerotic calcification aortic arch.  IMPRESSION: Bronchitic changes with questionable perihilar infiltrates.   Electronically Signed   By: Lavonia Dana M.D.   On: 05/05/2015 17:06    ------------------------------------------------------------------------------------------------------------------  Thank  you for allowing [facility] to assist in the care of your patient. Our recommendations are noted above.  Please contact us if we can be of further service.   Marda Stalker, MD.  [facility] Office Number: [phone]  Patricia Pesa, M.D.  Merton Border, M.D  07/26/2017

## 2017-07-26 NOTE — Progress Notes (Signed)
CT head shows sub acute CVA. MRI, Echo, caroitds,neuro checks, neurology consult ordered

## 2017-07-26 NOTE — Progress Notes (Signed)
OT Cancellation Note  Patient Details Name: Harjit Leider MRN: [id number] DOB: [birth year]   Cancelled Treatment:    Reason Eval/Treat Not Completed: Patient at procedure or test/ unavailable. Order received, chart reviewed. Upon attempt, pt out of room for head CT. Will re-attempt next date as medically appropriate.  Jeni Salles, MPH, MS, OTR/L ascom [phone] 07/26/17, 3:41 PM

## 2017-07-26 NOTE — Progress Notes (Signed)
Pharmacy Electrolyte Monitoring Consult:  Pharmacy consulted to assist in monitoring and replacing electrolytes in this 52 y.o. male admitted on 07/18/2017 with Shortness of Breath and Alcohol Intoxication   Labs:  Sodium (mmol/L)  Date Value  07/26/2017 142  06/01/2014 136   Potassium (mmol/L)  Date Value  07/26/2017 3.2 (L)  06/01/2014 4.1   Magnesium (mg/dL)  Date Value  07/26/2017 1.5 (L)  11/23/2013 2.1   Phosphorus (mg/dL)  Date Value  07/24/2017 4.0   Calcium (mg/dL)  Date Value  07/26/2017 8.4 (L)   Calcium, Total (mg/dL)  Date Value  06/01/2014 8.5   Albumin (g/dL)  Date Value  07/26/2017 2.4 (L)  06/04/2014 2.6 (L)    Assessment/Plan: Mg 1.5 and K+ 3.1. Will order KCL 40 mEq x 1 dose plus Mg sulfate 2 g iv. Will f/u AM labs.   Pernell Dupre, PharmD, BCPS Clinical Pharmacist 07/26/2017 8:38 AM

## 2017-07-27 ENCOUNTER — Inpatient Hospital Stay: Payer: Medicaid Other

## 2017-07-27 DIAGNOSIS — I6350 Cerebral infarction due to unspecified occlusion or stenosis of unspecified cerebral artery: Secondary | ICD-10-CM

## 2017-07-27 LAB — BASIC METABOLIC PANEL
ANION GAP: 8 (ref 5–15)
BUN: 25 mg/dL — AB (ref 6–20)
CHLORIDE: 103 mmol/L (ref 101–111)
CO2: 28 mmol/L (ref 22–32)
Calcium: 8.2 mg/dL — ABNORMAL LOW (ref 8.9–10.3)
Creatinine, Ser: 0.98 mg/dL (ref 0.61–1.24)
GFR calc Af Amer: >60 mL/min (ref >60)
GLUCOSE: 96 mg/dL (ref 65–99)
POTASSIUM: 3 mmol/L — AB (ref 3.5–5.1)
Sodium: 139 mmol/L (ref 135–145)

## 2017-07-27 LAB — GLUCOSE, CAPILLARY
GLUCOSE-CAPILLARY: 93 mg/dL (ref 65–99)
GLUCOSE-CAPILLARY: 88 mg/dL (ref 65–99)
Glucose-Capillary: 88 mg/dL (ref 65–99)
Glucose-Capillary: 109 mg/dL — ABNORMAL HIGH (ref 65–99)

## 2017-07-27 LAB — SEROTONIN RELEASE ASSAY (SRA)
SRA .2 IU/mL UFH Ser-aCnc: 7 % (ref 0–20)
SRA 100IU/mL UFH Ser-aCnc: 1 % (ref 0–20)

## 2017-07-27 LAB — ECHOCARDIOGRAM COMPLETE
HEIGHTINCHES: 69 in
Weight: 2793.67 oz

## 2017-07-27 LAB — MAGNESIUM: MAGNESIUM: 1.6 mg/dL — AB (ref 1.7–2.4)

## 2017-07-27 MED ORDER — ENOXAPARIN SODIUM 40 MG/0.4ML ~~LOC~~ SOLN
40.0000 mg | SUBCUTANEOUS | Status: DC
  Administered 2017-07-27 – 2017-07-31 (×5): 40 mg via SUBCUTANEOUS
  Filled 2017-07-27 (×5): qty 0.4

## 2017-07-27 MED ORDER — MAGNESIUM SULFATE 2 GM/50ML IV SOLN
2.0000 g | Freq: Once | INTRAVENOUS | Status: AC
  Administered 2017-07-27: 2 g via INTRAVENOUS
  Filled 2017-07-27: qty 50

## 2017-07-27 MED ORDER — POTASSIUM CHLORIDE CRYS ER 20 MEQ PO TBCR
40.0000 meq | EXTENDED_RELEASE_TABLET | ORAL | Status: AC
  Administered 2017-07-27 (×2): 40 meq via ORAL
  Filled 2017-07-27 (×2): qty 2

## 2017-07-27 MED ORDER — ASPIRIN 325 MG PO TABS
325.0000 mg | ORAL_TABLET | Freq: Every day | ORAL | Status: DC
  Administered 2017-07-27 – 2017-08-01 (×6): 325 mg via ORAL
  Filled 2017-07-27 (×6): qty 1

## 2017-07-27 NOTE — Progress Notes (Signed)
Speech Language Pathology Treatment: Dysphagia  Patient Details Name: George Arellano MRN: [id number] DOB: 05/06/1966 Today's Date: 07/27/2017 Time: 1030-1105 SLP Time Calculation (min) (ACUTE ONLY): 35 min  Assessment / Plan / Recommendation Clinical Impression  Pt seen for ongoing assessment of toleration of oral diet. 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 Pt exhibits Left Neglect and visual field/scanning deficits; L UE paresis, and impairments of Cognition impacting his processing time and awareness into his deficits; safety issues. Though, unsure of pt's full baseline Cognitive status prior to the acute stroke d/t the heavy Alcohol Abuse at home prior.  Pt exhibited min decreased L oral awareness of bolus material in his mouth impacting oral phase management which resulted in min oral residue and slower oral clearing. Pt was instructed on using lingual and finger sweeping to clear fully. Unsure of pt's full awareness for self-monitoring - he may need verbal cues for follow through w/ po strategies and aspiration precautions. Pt consumed mech soft consistency foods w/ thin liquids w/ no overt s/s of aspiration noted; min oral phase deficits. Pt was given upper denture plate to wear during session; he does not wear the lower plate d/t ill-fit. Due to this and his decreased oral awareness/management of increased textured foods, recommend meats minced for easier mastication and oral clearing. Pt will need assistance at meals for feeding and follow through w/ precautions; safety awareness. (noted food on side of plate and tray table he was not visually aware of).  Will modify diet to Dysphagia level 3 w/ Minced meats/Gravy to moisten; Thin liquids. Aspiration precautions; feeding support. Pt will benefit from dysphagia tx f/u for education and use of strategies. Pt may benefit from a formal  Cognitive assessment once at Rehab to determine [place] w/ ADLs and safety awareness as he begins transition home.    HPI HPI: Pt is a 52 year old admitted 07/18/17 with SOB and AMS. He was brought into the hospital secondary to worsening shortness of breath and was noted to be hypoxic and hypotensive. PMH significant for Heavy Alcohol Abuse(unsure of previous Cognitive status), COPD, CHF, anxiety, HTN, tobacco abuse. On 2 liter [place] at home baseline. 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 Patchy acute infarcts in the left PCA and bilateral cerebellar artery territories w/ Cytotoxic and gyral edema associated. Pt exhibits Left Neglect and visual deficits impacting his ability to feed himself.       SLP Plan  Continue with current plan of care       Recommendations  Diet recommendations: Dysphagia 3 (mechanical soft);Dysphagia 2 (fine chop);Thin liquid((meats)) Liquids provided via: Cup;Straw(no large straw) Medication Administration: Whole meds with puree Supervision: Patient able to self feed;Staff to assist with self feeding;Intermittent supervision to cue for compensatory strategies(unable to use LUE) Compensations: Minimize environmental distractions;Slow rate;Small sips/bites;Lingual sweep for clearance of pocketing;Multiple dry swallows after each bite/sip;Follow solids with liquid Postural Changes and/or Swallow Maneuvers: Seated upright 90 degrees;Upright 30-60 min after meal                General recommendations: (Dietician f/u) Oral Care Recommendations: Oral care BID;Patient independent with oral care;Staff/trained caregiver to provide oral care Follow up Recommendations: Skilled Nursing facility(TBD) SLP Visit Diagnosis: Dysphagia, oropharyngeal phase (R13.12) Plan: Continue with current plan of care       GO  Orinda Kenner, [place], CCC-SLP Reannon Candella 07/27/2017, 6:17  PM

## 2017-07-27 NOTE — Evaluation (Signed)
Occupational Therapy Evaluation Patient Details Name: George Arellano MRN: [id number] DOB: [birth year] Today's Date: 07/27/2017    History of Present Illness 52yo male presented to ER secondary to AMS, worsening SOB; admitted with septic shock, severe sepsis related to PNA.  Initially requiring BiPAP, now weaned to 2L.   Clinical Impression   Pt seen for OT evaluation this date. Pt alert, oriented, and agreeable to therapy. Pt reports living with mother in double wide mobile home with 4-5 steps to enter but has ramps ([employer] installed ramps for pt's father who has since passed). Pt reports indep with mobility short distances due to becoming SOB easily, wears 2-3L O2 at night, and indep with driving and basic ADL tasks, he and his mother share cooking/cleaning responsibilities. Pt presents with impairments in cognition (increased time for processing and initiating tasks, decreased safety awareness and insight into deficits), LUE strength/coordination, + pronator drift in LUE, impaired sensation in LUE, and L visual field deficits, impairing pt's ability to perform self care tasks and functional mobility. Supervision level for bed mobility this date, no lateral lean noted to sit EOB for visual scanning and safety awareness exercises. Pt educated in visual scanning exercises to perform as compensatory strategy for L visual field deficit to improve safety and independence, with verbal cues and additional time, pt able to turn head to L  during visual scanning exercises. Pt will benefit from skilled OT services to address noted impairments and functional deficits to maximize return to PLOF and minimize risk of falls, increased caregiver burden, or need for higher level of care. Recommend transition to STR following hospitalization.     Follow Up Recommendations  SNF    Equipment Recommendations  3 in 1 bedside commode    Recommendations for Other Services       Precautions / Restrictions  Precautions Precautions: Fall Restrictions Weight Bearing Restrictions: No      Mobility Bed Mobility Overal bed mobility: Needs Assistance Bed Mobility: Supine to Sit;Sit to Supine     Supine to sit: Supervision Sit to supine: Supervision      Transfers                 General transfer comment: deferred due to fatigue    Balance Overall balance assessment: Needs assistance Sitting-balance support: No upper extremity supported;Feet supported Sitting balance-Leahy Scale: Good Sitting balance - Comments: no lateral lean this date                                   ADL either performed or assessed with clinical judgement   ADL Overall ADL's : Needs assistance/impaired Eating/Feeding: Set up;Sitting Eating/Feeding Details (indicate cue type and reason): needs set up to R side Grooming: Sitting;Set up;Supervision/safety   Upper Body Bathing: Sitting;Minimal assistance   Lower Body Bathing: Sit to/from stand;Minimal assistance;Moderate assistance   Upper Body Dressing : Sitting;Minimal assistance   Lower Body Dressing: Sit to/from stand;Moderate assistance;Minimal assistance                       Vision Baseline Vision/History: (pt states he sometimes has "floaters") Patient Visual Report: No change from baseline Vision Assessment?: Yes Ocular Range of Motion: Within Functional Limits Alignment/Gaze Preference: Within Defined Limits Tracking/Visual Pursuits: Requires cues, head turns, or add eye shifts to track(unable to track to the L ) Visual Fields: Left visual field deficit     Perception  Praxis      Pertinent Vitals/Pain Pain Assessment: No/denies pain     Hand Dominance Right   Extremity/Trunk Assessment Upper Extremity Assessment Upper Extremity Assessment: LUE deficits/detail(RUE 4+/5) LUE Deficits / Details: grossly 3+/5, impaired sensation, + drift, decreased coordination   Lower Extremity Assessment Lower  Extremity Assessment: Defer to PT evaluation(grossly 4+5 RLE, 4/5 LLE, intact sensation)       Communication Communication Communication: No difficulties   Cognition Arousal/Alertness: Awake/alert Behavior During Therapy: WFL for tasks assessed/performed Overall Cognitive Status: No family/caregiver present to determine baseline cognitive functioning                                 General Comments: oriented, follows commands, increased time for processing and task initiation, decreased insight into deficits   General Comments       Exercises Other Exercises Other Exercises: pt educated in visual scanning exercises to perform as compensatory strategy for L visual field deficit to improve safety and independence, with verbal cues and additional time, pt able to turn head to L  during visual scanning exercises Other Exercises: pt demonstrated mild difficulty with problem solving and safety awareness exercises, requiring additional cues to help reason through appropriate responses   Shoulder Instructions      Home Living Family/patient expects to be discharged to:: Private residence Living Arrangements: Parent(mother) Available Help at Discharge: Family Type of Home: Mobile home       Home Layout: One level     Bathroom Shower/Tub: Tub/shower unit;Walk-in shower   Bathroom Toilet: Standard     Home Equipment: None          Prior Functioning/Environment Level of Independence: Needs assistance  Gait / Transfers Assistance Needed: pt notes ambulating independently without an AD ADL's / Homemaking Assistance Needed: Pt states he lives with his mother and they help each other with cooking/cleaning, enjoys working on his truck and going bowling when he has the energy, 2-3L O2 at home (pt states he wears at night). Pt reports indep with med mgt, ADL tasks, driving   Comments: pt notes falls in past but unable to say how many, "I can't say I have and I can't say I  haven't"        OT Problem List: Decreased strength;Impaired vision/perception;Decreased knowledge of use of DME or AE;Decreased coordination;Impaired UE functional use;Impaired sensation;Decreased safety awareness;Impaired balance (sitting and/or standing);Decreased cognition      OT Treatment/Interventions: Self-care/ADL training;Therapeutic exercise;Therapeutic activities;Neuromuscular education;Visual/perceptual remediation/compensation;DME and/or AE instruction;Patient/family education;Cognitive remediation/compensation    OT Goals(Current goals can be found in the care plan section) Acute Rehab OT Goals Patient Stated Goal: get better so he can go home OT Goal Formulation: With patient Time For Goal Achievement: 08/10/17 Potential to Achieve Goals: Good ADL Goals Pt Will Perform Upper Body Dressing: with set-up;with supervision;sitting Pt Will Perform Lower Body Dressing: with min guard assist;sit to/from stand Additional ADL Goal #1: Pt will utilize compensatory strategies for visual deficits on L side during self care tasks with PRN verbal cues in order to maximize safety.  OT Frequency:     Barriers to D/C:            Co-evaluation              AM-PAC PT "6 Clicks" Daily Activity     Outcome Measure Help from another person eating meals?: A Little Help from another person taking care of personal grooming?: A Little  Help from another person toileting, which includes using toliet, bedpan, or urinal?: A Little Help from another person bathing (including washing, rinsing, drying)?: A Lot Help from another person to put on and taking off regular upper body clothing?: A Lot Help from another person to put on and taking off regular lower body clothing?: A Lot 6 Click Score: 15   End of Session    Activity Tolerance: Patient tolerated treatment well Patient left: in bed;with call bell/phone within reach;with bed alarm set  OT Visit Diagnosis: Other abnormalities of  gait and mobility (R26.89);Hemiplegia and hemiparesis;Other symptoms and signs involving cognitive function Hemiplegia - Right/Left: Left Hemiplegia - dominant/non-dominant: Non-Dominant Hemiplegia - caused by: Cerebral infarction                Time: 0768-0881 OT Time Calculation (min): 42 min Charges:  OT General Charges $OT Visit: 1 Visit OT Evaluation $OT Eval Moderate Complexity: 1 Mod OT Treatments $Self Care/Home Management : 8-22 mins $Cognitive Skills Development: 8-22 mins  Jeni Salles, MPH, MS, OTR/L ascom [phone] 07/27/17, 3:14 PM

## 2017-07-27 NOTE — Progress Notes (Signed)
* [facility]     Assessment and Plan: Acute hypoxemic respiratory failure, due to pneumonia/acute exacerbation of COPD.  Patient has been weaned down to 2 L nasal cannula.  Underlying alcoholic cirrhosis. Deconditioning/ Weakness.  CVA  Advance activity as tolerated.  Incentive spirometry.  Bipap qhs to help with lung expansion.  Continue to wean down oxygen. Continue Pip-Tazo for total 7-day course. Outpatient pulmonary and sleep follow-up. Pt was given my card and asked to follow up outpatient.   Antibiotics: Zosyn 1/10>>  Micro: MRSA PCR negative 07/18/17 Blood culture -07/18/17  x 2. Urine culture negative 07/18/17    Date: 07/27/2017  MRN# [id number] George Arellano 12-24-1965   George Arellano is a 52 y.o. old male seen in follow up for chief complaint of  Chief Complaint  Patient presents with  . Shortness of Breath  . Alcohol Intoxication     HPI:   Pt resting comfortably/sleeping. No distress.  chest x-ray 07/26/17; continued lower middle lobe, left lingular infiltrate, CT chest 07/19/17, shows presence of pneumonia and above-mentioned segments, apical emphysema.   Medication:    Current Facility-Administered Medications:  .  acetaminophen (TYLENOL) tablet 650 mg, 650 mg, Oral, Q6H PRN **OR** acetaminophen (TYLENOL) suppository 650 mg, 650 mg, Rectal, Q6H PRN, Epifanio Lesches, MD .  aspirin tablet 325 mg, 325 mg, Oral, Daily, Leotis Pain, MD, 325 mg at 07/27/17 1326 .  budesonide (PULMICORT) nebulizer solution 0.25 mg, 0.25 mg, Nebulization, Q6H, Wilhelmina Mcardle, MD, 0.25 mg at 07/27/17 0756 .  chlorhexidine (PERIDEX) 0.12 % solution 15 mL, 15 mL, Mouth Rinse, BID, Tukov, Magadalene S, NP, 15 mL at 07/27/17 1022 .  enoxaparin (LOVENOX) injection 40 mg, 40 mg, Subcutaneous, Q24H, Kalisetti, Radhika, MD .  feeding supplement (ENSURE ENLIVE) (ENSURE ENLIVE) liquid 237 mL, 237 mL, Oral, TID BM, Sudini, Srikar, MD, 237 mL at 07/26/17 0937 .   folic acid injection 1 mg, 1 mg, Intravenous, Daily, Epifanio Lesches, MD, 1 mg at 07/26/17 1811 .  HYDROcodone-acetaminophen (NORCO/VICODIN) 5-325 MG per tablet 1-2 tablet, 1-2 tablet, Oral, Q4H PRN, Epifanio Lesches, MD, 2 tablet at 07/19/17 1935 .  ipratropium (ATROVENT) nebulizer solution 0.5 mg, 0.5 mg, Nebulization, Q6H, Wilhelmina Mcardle, MD, 0.5 mg at 07/27/17 0756 .  levalbuterol (XOPENEX) nebulizer solution 0.63 mg, 0.63 mg, Nebulization, Q6H, Epifanio Lesches, MD, 0.63 mg at 07/27/17 0756 .  LORazepam (ATIVAN) injection 1-2 mg, 1-2 mg, Intravenous, Q4H PRN, Tukov, Magadalene S, NP, 2 mg at 07/27/17 0328 .  MEDLINE mouth rinse, 15 mL, Mouth Rinse, q12n4p, Tukov, Magadalene S, NP, 15 mL at 07/26/17 1811 .  multivitamin with minerals tablet 1 tablet, 1 tablet, Oral, Daily, Conforti, John, DO, 1 tablet at 07/27/17 1022 .  [DISCONTINUED] ondansetron (ZOFRAN) tablet 4 mg, 4 mg, Oral, Q6H PRN **OR** ondansetron (ZOFRAN) injection 4 mg, 4 mg, Intravenous, Q6H PRN, Epifanio Lesches, MD, 4 mg at 07/26/17 1956 .  piperacillin-tazobactam (ZOSYN) IVPB 3.375 g, 3.375 g, Intravenous, Q8H, Sudini, Srikar, MD, Last Rate: 12.5 mL/hr at 07/27/17 1326, 3.375 g at 07/27/17 1326 .  thiamine (B-1) injection 100 mg, 100 mg, Intravenous, Daily, Epifanio Lesches, MD, 100 mg at 07/27/17 1022   Allergies:  Levaquin [levofloxacin]  Review of Systems: Gen:  Denies  fever, sweats. HEENT: Denies blurred vision. Cvc:  No dizziness, chest pain or heaviness Resp:   Denies cough or sputum porduction. Gi: Denies swallowing difficulty, stomach pain. constipation, bowel incontinence Gu:  Denies bladder incontinence, burning urine Ext:   No Joint  pain, stiffness. Skin: No skin rash, easy bruising. Endoc:  No polyuria, polydipsia. Psych: No depression, insomnia. Other:  All other systems were reviewed and found to be negative other than what is mentioned in the HPI.   Physical Examination:   VS:  BP (!) 100/57 (BP Location: Left Arm)   Pulse 90   Temp 99.4 F (37.4 C) (Oral)   Resp 16   Ht 5\' 9"  (1.753 m)   Wt 160 lb 8 oz (72.8 kg)   SpO2 97%   BMI 23.70 kg/m    General Appearance: No distress  Neuro:without focal findings. HEENT: PERRLA, EOM intact. Pulmonary: normal breath sounds, scattered bilat wheezing.  Decreased air entry bilaterally. CardiovascularNormal S1,S2.  No m/r/g.   Abdomen: Benign, Soft, non-tender. Renal:  No costovertebral tenderness  GU:  Not performed at this time. Endoc: No evident thyromegaly, no signs of acromegaly. Skin:   warm, no rash. Extremities: normal, no cyanosis, clubbing.   LABORATORY PANEL:   CBC Recent Labs  Lab 07/26/17 0644  WBC 11.5*  HGB 12.5*  HCT 37.2*  PLT 96*   ------------------------------------------------------------------------------------------------------------------  Chemistries  Recent Labs  Lab 07/26/17 0644 07/27/17 0351  NA 142 139  K 3.2* 3.0*  CL 103 103  CO2 32 28  GLUCOSE 108* 96  BUN 31* 25*  CREATININE 1.18 0.98  CALCIUM 8.4* 8.2*  MG 1.5* 1.6*  AST 69*  --   ALT 81*  --   ALKPHOS 68  --   BILITOT 1.9*  --    ------------------------------------------------------------------------------------------------------------------  Cardiac Enzymes No results for input(s): TROPONINI in the last 168 hours. ------------------------------------------------------------  RADIOLOGY:   No results found for this or any previous visit. Results for orders placed during the hospital encounter of 05/05/15  DG Chest 2 View   Narrative CLINICAL DATA:  Cough, shortness of breath and nausea for 3 days, history hypertension, CHF, COPD, smoker  EXAM: CHEST  2 VIEW  COMPARISON:  09/25/2014  FINDINGS: Normal heart size, mediastinal contours and pulmonary vascularity.  Increased perihilar markings bilaterally question infiltrate.  No pleural effusion or pneumothorax.  Bones unremarkable.  Mild  atherosclerotic calcification aortic arch.  IMPRESSION: Bronchitic changes with questionable perihilar infiltrates.   Electronically Signed   By: Lavonia Dana M.D.   On: 05/05/2015 17:06    ------------------------------------------------------------------------------------------------------------------  Thank  you for allowing [facility] to assist in the care of your patient. Our recommendations are noted above.  Please contact us if we can be of further service.   Marda Stalker, MD.  [facility] Office Number: [phone]  Patricia Pesa, M.D.  Merton Border, M.D  07/27/2017

## 2017-07-27 NOTE — Clinical Social Work Note (Addendum)
CSW received consult that patient may need SNF placement.  CSW has began bed search process.  Due to patient only having Medicaid it may be difficult to find placement for patient, CSW has faxed information to SNFs.  CSW to continue to follow patient's progress.  Assessment to follow at a later time when patient is more awake.  CSW also awaiting for [facility] number for patient.  Jones Broom. [place], MSW, [place]  07/27/2017 6:30 PM

## 2017-07-27 NOTE — Progress Notes (Signed)
OT Cancellation Note  Patient Details Name: Milam Allbaugh MRN: [id number] DOB: 04-15-1966   Cancelled Treatment:    Reason Eval/Treat Not Completed: Patient declined, no reason specified. Order received, chart reviewed. Pt declined OT evaluation this morning. MRI still pending. Will re-attempt at later date/time as pt is available and medically appropriate.  Jeni Salles, MPH, MS, OTR/L ascom [phone] 07/27/17, 11:56 AM

## 2017-07-27 NOTE — Progress Notes (Addendum)
PT Cancellation Note  Patient Details Name: George Arellano MRN: [id number] DOB: [birth year]   Cancelled Treatment:    Reason Eval/Treat Not Completed: Fatigue/lethargy limiting ability to participate(Treatment attempted.  Patient declining at this time, preferring to rest for a while.  Unable to redirect despite encouragement.  Will continue efforts at later time/date as appropriate.)   Of note, patient noted with CT head positive for CVA; MRI/MRA pending.  Will continue to follow and plan to increase treatment frequency to QD as appropriate.   Marcello Tuzzolino H. Owens Shark, PT, DPT, NCS 07/27/17, 11:30 AM [phone]

## 2017-07-27 NOTE — Progress Notes (Signed)
Pharmacy Electrolyte Monitoring Consult:  Pharmacy consulted to assist in monitoring and replacing electrolytes in this 52 y.o. male admitted on 07/18/2017 with Shortness of Breath and Alcohol Intoxication   Labs:  Sodium (mmol/L)  Date Value  07/27/2017 139  06/01/2014 136   Potassium (mmol/L)  Date Value  07/27/2017 3.0 (L)  06/01/2014 4.1   Magnesium (mg/dL)  Date Value  07/27/2017 1.6 (L)  11/23/2013 2.1   Phosphorus (mg/dL)  Date Value  07/24/2017 4.0   Calcium (mg/dL)  Date Value  07/27/2017 8.2 (L)   Calcium, Total (mg/dL)  Date Value  06/01/2014 8.5   Albumin (g/dL)  Date Value  07/26/2017 2.4 (L)  06/04/2014 2.6 (L)    Assessment/Plan: Mg 1.6 and K+ 3.0. MD has ordered KCL 40 mEq x 2 plus Mg sulfate 2 g IV x 1. Will f/u AM labs.   Pernell Dupre, PharmD, BCPS Clinical Pharmacist 07/27/2017 7:29 AM

## 2017-07-27 NOTE — Consult Note (Signed)
Reason for Consult:stroke Referring Physician: Dr. Tressia Miners   CC: stroke  HPI: George Arellano is an 52 y.o. male with known history of COPD on 2 L home oxygen, heavy alcohol use, smoking, hypertension brought into the hospital secondary to worsening shortness of breath and was noted to be hypoxic and hypotensive. Pt on antibiotics for PNA and COPD exacerbation.  Pt was admitted 9 days ago. Had complaint of generalized weakness.  CTH done b/l posterior circulation strokes.       Past Medical History:  Diagnosis Date  . Anxiety   . CHF (congestive heart failure) ([place])   . COPD (chronic obstructive pulmonary disease) ([place])   . Hypertension     History reviewed. No pertinent surgical history.  Family History  Problem Relation Age of Onset  . Hypertension Other     Social History:  reports that he has been smoking cigarettes.  He has been smoking about 1.00 pack per day. He does not have any smokeless tobacco history on file. He reports that he drinks about 25.2 oz of alcohol per week. He reports that he uses drugs. Drug: Marijuana.  Allergies  Allergen Reactions  . Levaquin [Levofloxacin] Other (See Comments)    Reaction:  Unknown     Medications: I have reviewed the patient's current medications.  ROS: History obtained from the patient  General ROS: negative for - chills, fatigue, fever, night sweats, weight gain or weight loss Psychological ROS: negative for - behavioral disorder, hallucinations, memory difficulties, mood swings or suicidal ideation Ophthalmic ROS: negative for - blurry vision, double vision, eye pain or loss of vision ENT ROS: negative for - epistaxis, nasal discharge, oral lesions, sore throat, tinnitus or vertigo Allergy and Immunology ROS: negative for - hives or itchy/watery eyes Hematological and Lymphatic ROS: negative for - bleeding problems, bruising or swollen lymph nodes Endocrine ROS: negative for - galactorrhea, hair pattern changes,  polydipsia/polyuria or temperature intolerance Respiratory ROS: Positive for SOB, cough Cardiovascular ROS: negative for - chest pain, dyspnea on exertion, edema or irregular heartbeat Gastrointestinal ROS: negative for - abdominal pain, diarrhea, hematemesis, nausea/vomiting or stool incontinence Genito-Urinary ROS: negative for - dysuria, hematuria, incontinence or urinary frequency/urgency Musculoskeletal ROS: negative for - joint swelling or muscular weakness Neurological ROS: as noted in HPI Dermatological ROS: negative for rash and skin lesion changes  Physical Examination: Blood pressure 136/82, pulse 95, temperature 97.9 F (36.6 C), temperature source Oral, resp. rate 17, height 5\' 9"  (1.753 m), weight 160 lb 8 oz (72.8 kg), SpO2 99 %.   Neurological Examination   Mental Status: Alert, oriented, thought content appropriate.  Speech fluent without evidence of aphasia.  Able to follow 3 step commands without difficulty. Cranial Nerves: II: Discs flat bilaterally; Visual fields grossly normal, pupils equal, round, reactive to light and accommodation III,IV, VI: ptosis not present, extra-ocular motions intact bilaterally V,VII: smile symmetric, facial light touch sensation normal bilaterally VIII: hearing normal bilaterally IX,X: gag reflex present XI: bilateral shoulder shrug XII: midline tongue extension Motor: Right : Upper extremity   4+/5    Left:     Upper extremity   4+/5  Lower extremity   4/5     Lower extremity   4/5 Tone and bulk:normal tone throughout; no atrophy noted Sensory: Pinprick and light touch intact throughout, bilaterally Deep Tendon Reflexes: 1+ and symmetric throughout Plantars: Right: downgoing   Left: downgoing Cerebellar: Not tested Gait: not tested      Laboratory Studies:   Basic Metabolic Panel: Recent Labs  Lab 07/22/17 0401 07/23/17 0545 07/23/17 1847 07/24/17 0343 07/25/17 0351 07/26/17 0644 07/27/17 0351  NA 150* 154*  --   151* 146* 142 139  K 3.3* 2.9* 3.2* 4.1 3.8 3.2* 3.0*  CL 103 112*  --  112* 110 103 103  CO2 34* 32  --  30 28 32 28  GLUCOSE 180* 152*  --  135* 119* 108* 96  BUN 88* 76*  --  61* 46* 31* 25*  CREATININE 2.32* 1.88*  --  1.34* 1.06 1.18 0.98  CALCIUM 8.1* 8.4*  --  8.4* 8.1* 8.4* 8.2*  MG 2.2  --   --  1.7 1.5* 1.5* 1.6*  PHOS 5.7*  --   --  4.0  --   --   --     Liver Function Tests: Recent Labs  Lab 07/26/17 0644  AST 69*  ALT 81*  ALKPHOS 68  BILITOT 1.9*  PROT 5.9*  ALBUMIN 2.4*   No results for input(s): LIPASE, AMYLASE in the last 168 hours. No results for input(s): AMMONIA in the last 168 hours.  CBC: Recent Labs  Lab 07/21/17 0313 07/22/17 0401 07/23/17 0545 07/26/17 0644  WBC 7.3 5.0 6.3 11.5*  HGB 12.5* 12.5* 13.0 12.5*  HCT 37.2* 36.5* 38.8* 37.2*  MCV 102.1* 102.8* 104.1* 101.4*  PLT 72* 58* 66* 96*    Cardiac Enzymes: No results for input(s): CKTOTAL, CKMB, CKMBINDEX, TROPONINI in the last 168 hours.  BNP: Invalid input(s): POCBNP  CBG: Recent Labs  Lab 07/25/17 1233 07/25/17 1538 07/26/17 0745 07/26/17 1145 07/27/17 0734  GLUCAP 184* 135* 94 105* 88    Microbiology: Results for orders placed or performed during the hospital encounter of 07/18/17  Blood Culture (routine x 2)     Status: None   Collection Time: 07/18/17  8:14 AM  Result Value Ref Range Status   Specimen Description BLOOD LEFT ARM  Final   Special Requests   Final    BOTTLES DRAWN AEROBIC AND ANAEROBIC Blood Culture adequate volume   Culture   Final    NO GROWTH 5 DAYS Performed at [facility], [address] [place], [place] [address]    Report Status 07/23/2017 FINAL  Final  Blood Culture (routine x 2)     Status: None   Collection Time: 07/18/17  8:15 AM  Result Value Ref Range Status   Specimen Description BLOOD RIGHT ARM  Final   Special Requests   Final    BOTTLES DRAWN AEROBIC AND ANAEROBIC Blood Culture adequate volume   Culture   Final     NO GROWTH 5 DAYS Performed at [facility], [address] [place], [place] [address]    Report Status 07/23/2017 FINAL  Final  MRSA PCR Screening     Status: None   Collection Time: 07/18/17 10:01 AM  Result Value Ref Range Status   MRSA by PCR NEGATIVE NEGATIVE Final    Comment:        The GeneXpert MRSA Assay (FDA approved for NASAL specimens only), is one component of a comprehensive MRSA colonization surveillance program. It is not intended to diagnose MRSA infection nor to guide or monitor treatment for MRSA infections. Performed at [facility], [address], [place], [place] [address]   Urine culture     Status: None   Collection Time: 07/18/17  7:40 PM  Result Value Ref Range Status   Specimen Description   Final    URINE, RANDOM Performed at [facility], [address] [place], [place] [address]    Special Requests   Final    NONE Performed at [facility], [address], [place], [place] [address]    Culture   Final    NO GROWTH Performed at [facility], [place] [address], [place], [place] [address]    Report Status 07/20/2017 FINAL  Final    Coagulation Studies: No results for input(s): LABPROT, INR in the last 72 hours.  Urinalysis: No results for input(s): COLORURINE, LABSPEC, PHURINE, GLUCOSEU, HGBUR, BILIRUBINUR, KETONESUR, PROTEINUR, UROBILINOGEN, NITRITE, LEUKOCYTESUR in the last 168 hours.  Invalid input(s): APPERANCEUR  Lipid Panel:  No results found for: CHOL, TRIG, HDL, CHOLHDL, VLDL, LDLCALC  HgbA1C:  Lab Results  Component Value Date   HGBA1C 5.1 06/15/2012    Urine Drug Screen:      Component Value Date/Time   LABOPIA NEGATIVE 04/24/2014 [place] 04/24/2014 1338   LABBENZ NEGATIVE 04/24/2014 1338   AMPHETMU NEGATIVE 04/24/2014 1338   THCU POSITIVE 04/24/2014 1338   LABBARB NEGATIVE 04/24/2014 1338    Alcohol Level: No results for input(s): ETH in the last 168  hours.  Other results: EKG: normal EKG, normal sinus rhythm, unchanged from previous tracings.  Imaging: Dg Chest 2 View  Result Date: 07/27/2017 CLINICAL DATA:  Pneumonia EXAM: CHEST  2 VIEW COMPARISON:  07/26/2017 FINDINGS: Bibasilar airspace unchanged. Left upper lobe infiltrate unchanged. Small bilateral pleural effusions unchanged. Negative for heart failure. IMPRESSION: Bilateral airspace disease unchanged, probable pneumonia Electronically Signed   By: Franchot Gallo M.D.   On: 07/27/2017 09:51   Ct Head Wo Contrast  Result Date: 07/26/2017 CLINICAL DATA:  AMS unexplained. Admitted for septic shock secondary to pneumonia also found to have acute renal failure, alcohol withdrawal, COPD exacerbation, hypokalemia. EXAM: CT HEAD WITHOUT CONTRAST TECHNIQUE: Contiguous axial images were obtained from the base of the skull through the vertex without intravenous contrast. COMPARISON:  09/05/2014 FINDINGS: Brain: There is well-defined hypoattenuation that extends from the right posterior parietal lobe throughout most of the right occipital lobe into the posterior to mid right temporal lobe. A smaller area of hypoattenuation is noted in the right thalamus extending to the posterior limb of the right internal capsule. There is mass effect associated with the hypoattenuation, effacing the atrium and occipital horn of the right lateral ventricle, displacing it anteriorly, as well as effacing overlying sulci. Additionally, there are small areas of hypoattenuation in both cerebellar hemispheres superiorly. These findings are new from the prior exam. The ventricles are normal in overall size and configuration. There are no parenchymal masses. There are no extra-axial masses or abnormal fluid collections. There is no intracranial hemorrhage. Vascular: No hyperdense vessel or unexpected calcification. Skull: Normal. Negative for fracture or focal lesion. Sinuses/Orbits: Globes and orbits are unremarkable. The  visualized sinuses and mastoid air cells are clear Other: None IMPRESSION: 1. Subacute infarctions. The largest area of infarction involves the right posterior cerebral artery distribution as detailed above. There are smaller subacute infarcts involving the right and left superior cerebellar hemispheres. 2. No intracranial hemorrhage. Electronically Signed   By: Lajean Manes M.D.   On: 07/26/2017 15:30   US Carotid Bilateral  Result Date: 07/26/2017 CLINICAL DATA:  52 year old male with symptoms of cerebrovascular accident EXAM: BILATERAL CAROTID DUPLEX ULTRASOUND TECHNIQUE: Pearline Cables scale imaging, color Doppler and duplex ultrasound were performed of bilateral carotid and vertebral arteries in the neck. COMPARISON:  Head CT 07/26/2017 FINDINGS: Criteria: Quantification of carotid  stenosis is based on velocity parameters that correlate the residual internal carotid diameter with NASCET-based stenosis levels, using the diameter of the distal internal carotid lumen as the denominator for stenosis measurement. The following velocity measurements were obtained: RIGHT ICA:  201/69 cm/sec CCA:  02/63 cm/sec SYSTOLIC ICA/CCA RATIO:  2.1 DIASTOLIC ICA/CCA RATIO:  4.5 ECA:  117 cm/sec LEFT ICA:  198/74 cm/sec CCA:  785/88 cm/sec SYSTOLIC ICA/CCA RATIO:  1.9 DIASTOLIC ICA/CCA RATIO:  3.2 ECA:  106 cm/sec RIGHT CAROTID ARTERY: Heterogeneous atherosclerotic plaque in the proximal internal carotid artery is irregular. There elevation of the peak systolic velocity along with spectral broadening consistent with a moderate (50-69%) stenosis. RIGHT VERTEBRAL ARTERY:  Patent with antegrade flow. LEFT CAROTID ARTERY: Focal heterogeneous atherosclerotic plaque in the proximal internal carotid artery resulting in elevation of the peak systolic velocity consistent with a moderate (50-69%) stenosis. LEFT VERTEBRAL ARTERY: Patent with antegrade flow. However, there is early systolic deceleration consistent with incomplete steal.  IMPRESSION: 1. Moderate (50-69%) stenosis proximal right internal carotid artery secondary to heterogeneous and irregular atherosclerotic plaque. 2. Moderate (50-69%) stenosis proximal left internal carotid artery secondary to heterogeneous atherosclerotic plaque. 3. 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 This suggests a more proximal stenosis in the left subclavian artery. Does the patient have clinical symptoms of left upper extremity claudication, or vertebrobasilar symptoms? 4. Normal antegrade flow in the right vertebral artery. Signed, Criselda Peaches, MD [facility] Electronically Signed   By: Jacqulynn Cadet M.D.   On: 07/26/2017 17:52   Dg Chest Port 1 View  Result Date: 07/26/2017 CLINICAL DATA:  Respiratory failure. EXAM: PORTABLE CHEST 1 VIEW COMPARISON:  07/23/2017.  CT 07/19/2017. FINDINGS: Mediastinum and hilar structures normal. Heart size normal. Bilateral pulmonary infiltrates/edema again noted. Partial clearing from prior exam. No pleural effusion or pneumothorax. IMPRESSION: Bilateral pulmonary infiltrates/edema again noted. Partial clearing from prior exam. Electronically Signed   By: Marcello Moores  Register   On: 07/26/2017 07:30     Assessment/Plan: 52 y.o. male with known history of COPD on 2 L home oxygen, heavy alcohol use, smoking, hypertension brought into the hospital secondary to worsening shortness of breath and was noted to be hypoxic and hypotensive. Pt on antibiotics for PNA and COPD exacerbation.  Pt was admitted 9 days ago. Had complaint of generalized weakness.  CTH done b/l posterior circulation strokes.     - ASA ordered - MRI and MRA pending specifically looking at posterior circulation and his basilar artery - currently appears sinus rhythm - generalized weakness.  - statin - fill follow  07/27/2017, 10:57 AM

## 2017-07-27 NOTE — Progress Notes (Signed)
[facility] [place] NAME: George Arellano    MR#:  [id number]  DATE OF BIRTH:  [birth year]  SUBJECTIVE:  CHIEF COMPLAINT:   Chief Complaint  Patient presents with  . Shortness of Breath  . Alcohol Intoxication   -  Resting well on BiPAP this morning. No hallucinations today. On and of metal status. -Noted to have weakness yesterday with physical therapy and CT with sub acute stroke. For MRI today.  REVIEW OF SYSTEMS:  Review of Systems  Constitutional: Negative for chills, fever and malaise/fatigue.  HENT: Negative for congestion, ear discharge, hearing loss and nosebleeds.   Eyes: Negative for blurred vision and double vision.  Respiratory: Positive for shortness of breath. Negative for cough and wheezing.   Cardiovascular: Negative for chest pain and palpitations.  Gastrointestinal: Negative for abdominal pain, constipation, diarrhea, nausea and vomiting.  Genitourinary: Negative for dysuria.  Musculoskeletal: Negative for myalgias.  Neurological: Positive for focal weakness. Negative for dizziness, sensory change, seizures and headaches.  Psychiatric/Behavioral: Positive for hallucinations.    DRUG ALLERGIES:   Allergies  Allergen Reactions  . Levaquin [Levofloxacin] Other (See Comments)    Reaction:  Unknown     VITALS:  Blood pressure 136/82, pulse 95, temperature 97.9 F (36.6 C), temperature source Oral, resp. rate 17, height 5\' 9"  (1.753 m), weight 72.8 kg (160 lb 8 oz), SpO2 99 %.  PHYSICAL EXAMINATION:  Physical Exam  GENERAL:  52 y.o.-year-old patient lying in the bed with no acute distress. disheleved appearing EYES: Pupils equal, round, reactive to light and accommodation. No scleral icterus. Extraocular muscles intact.  HEENT: Head atraumatic, normocephalic. Oropharynx and nasopharynx clear.  NECK:  Supple, no jugular venous distention. No thyroid enlargement, no tenderness.  LUNGS: Normal breath sounds  bilaterally, no wheezing, rales,rhonchi or crepitation. No use of accessory muscles of respiration. Decreased bibasilar breath sounds CARDIOVASCULAR: S1, S2 normal. No murmurs, rubs, or gallops.  ABDOMEN: Soft, nontender, nondistended. Bowel sounds present. No organomegaly or mass.  EXTREMITIES: No pedal edema, cyanosis, or clubbing.  NEUROLOGIC: Cranial nerves II through XII are intact. Muscle strength 4/5 in all extremities. Sensation intact. Gait not checked.  PSYCHIATRIC: The patient is alert and oriented x 2-3.  SKIN: No obvious rash, lesion, or ulcer.    LABORATORY PANEL:   CBC Recent Labs  Lab 07/26/17 0644  WBC 11.5*  HGB 12.5*  HCT 37.2*  PLT 96*   ------------------------------------------------------------------------------------------------------------------  Chemistries  Recent Labs  Lab 07/26/17 0644 07/27/17 0351  NA 142 139  K 3.2* 3.0*  CL 103 103  CO2 32 28  GLUCOSE 108* 96  BUN 31* 25*  CREATININE 1.18 0.98  CALCIUM 8.4* 8.2*  MG 1.5* 1.6*  AST 69*  --   ALT 81*  --   ALKPHOS 68  --   BILITOT 1.9*  --    ------------------------------------------------------------------------------------------------------------------  Cardiac Enzymes No results for input(s): TROPONINI in the last 168 hours. ------------------------------------------------------------------------------------------------------------------  RADIOLOGY:  Ct Head Wo Contrast  Result Date: 07/26/2017 CLINICAL DATA:  AMS unexplained. Admitted for septic shock secondary to pneumonia also found to have acute renal failure, alcohol withdrawal, COPD exacerbation, hypokalemia. EXAM: CT HEAD WITHOUT CONTRAST TECHNIQUE: Contiguous axial images were obtained from the base of the skull through the vertex without intravenous contrast. COMPARISON:  09/05/2014 FINDINGS: Brain: There is well-defined hypoattenuation that extends from the right posterior parietal lobe throughout most of the right  occipital lobe into the posterior to mid right temporal lobe.  A smaller area of hypoattenuation is noted in the right thalamus extending to the posterior limb of the right internal capsule. There is mass effect associated with the hypoattenuation, effacing the atrium and occipital horn of the right lateral ventricle, displacing it anteriorly, as well as effacing overlying sulci. Additionally, there are small areas of hypoattenuation in both cerebellar hemispheres superiorly. These findings are new from the prior exam. The ventricles are normal in overall size and configuration. There are no parenchymal masses. There are no extra-axial masses or abnormal fluid collections. There is no intracranial hemorrhage. Vascular: No hyperdense vessel or unexpected calcification. Skull: Normal. Negative for fracture or focal lesion. Sinuses/Orbits: Globes and orbits are unremarkable. The visualized sinuses and mastoid air cells are clear Other: None IMPRESSION: 1. Subacute infarctions. The largest area of infarction involves the right posterior cerebral artery distribution as detailed above. There are smaller subacute infarcts involving the right and left superior cerebellar hemispheres. 2. No intracranial hemorrhage. Electronically Signed   By: Lajean Manes M.D.   On: 07/26/2017 15:30   US Carotid Bilateral  Result Date: 07/26/2017 CLINICAL DATA:  52 year old male with symptoms of cerebrovascular accident EXAM: BILATERAL CAROTID DUPLEX ULTRASOUND TECHNIQUE: Pearline Cables scale imaging, color Doppler and duplex ultrasound were performed of bilateral carotid and vertebral arteries in the neck. COMPARISON:  Head CT 07/26/2017 FINDINGS: Criteria: Quantification of carotid stenosis is based on velocity parameters that correlate the residual internal carotid diameter with NASCET-based stenosis levels, using the diameter of the distal internal carotid lumen as the denominator for stenosis measurement. The following velocity measurements  were obtained: RIGHT ICA:  201/69 cm/sec CCA:  56/25 cm/sec SYSTOLIC ICA/CCA RATIO:  2.1 DIASTOLIC ICA/CCA RATIO:  4.5 ECA:  117 cm/sec LEFT ICA:  198/74 cm/sec CCA:  638/93 cm/sec SYSTOLIC ICA/CCA RATIO:  1.9 DIASTOLIC ICA/CCA RATIO:  3.2 ECA:  106 cm/sec RIGHT CAROTID ARTERY: Heterogeneous atherosclerotic plaque in the proximal internal carotid artery is irregular. There elevation of the peak systolic velocity along with spectral broadening consistent with a moderate (50-69%) stenosis. RIGHT VERTEBRAL ARTERY:  Patent with antegrade flow. LEFT CAROTID ARTERY: Focal heterogeneous atherosclerotic plaque in the proximal internal carotid artery resulting in elevation of the peak systolic velocity consistent with a moderate (50-69%) stenosis. LEFT VERTEBRAL ARTERY: Patent with antegrade flow. However, there is early systolic deceleration consistent with incomplete steal. IMPRESSION: 1. Moderate (50-69%) stenosis proximal right internal carotid artery secondary to heterogeneous and irregular atherosclerotic plaque. 2. Moderate (50-69%) stenosis proximal left internal carotid artery secondary to heterogeneous atherosclerotic plaque. 3. 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 This suggests a more proximal stenosis in the left subclavian artery. Does the patient have clinical symptoms of left upper extremity claudication, or vertebrobasilar symptoms? 4. Normal antegrade flow in the right vertebral artery. Signed, Criselda Peaches, MD [facility] Electronically Signed   By: Jacqulynn Cadet M.D.   On: 07/26/2017 17:52   Dg Chest Port 1 View  Result Date: 07/26/2017 CLINICAL DATA:  Respiratory failure. EXAM: PORTABLE CHEST 1 VIEW COMPARISON:  07/23/2017.  CT 07/19/2017. FINDINGS: Mediastinum and hilar structures normal. Heart size normal. Bilateral pulmonary infiltrates/edema again noted. Partial clearing from prior exam. No pleural effusion or  pneumothorax. IMPRESSION: Bilateral pulmonary infiltrates/edema again noted. Partial clearing from prior exam. Electronically Signed   By: Marcello Moores  Register   On: 07/26/2017 07:30    EKG:   Orders placed or performed during the hospital encounter of 07/18/17  . ED EKG 12-Lead  . ED  EKG 12-Lead  . EKG 12-Lead  . EKG 12-Lead    ASSESSMENT AND PLAN:   52 year old male with known history of COPD on 2 L home oxygen, heavy alcohol use, smoking, hypertension brought into the hospital secondary to worsening shortness of breath and was noted to be hypoxic and hypotensive.  1. Sub acute bilateral Cerebellar infarcts-could have happened on admission and patient presented with hypoxia. -MRI and MRA of the brain today. Carotid Dopplers done with no hemodynamically significant stenosis. -Neuro consult, neuro checks -PT consult recommended rehabilitation  2. Acute hypoxic and hypercarbic respiratory failure on admission-secondary to COPD exacerbation and bilateral pneumonia -Also presented with septic shock that has been resolved. Off pressors now -Continue steroids and antibiotics. On Zosyn for aspiration pneumonia. Stop after 10 days -BiPAP at bedtime while in the hospital. Uses home oxygen 2-3 L -Blood cultures have remained negative.  3. Acute renal failure-ATN from sepsis and also rhabdomyolysis. -Improved after fluids.  4. Acute metabolic encephalopathy-secondary to alcohol withdrawal symptoms and stroke and sepsis on admission -Hallucinations are improving. Continue withdrawal protocol.  5. Hypokalemia and hypomagnesemia-being replaced and monitor.  6. DVT prophylaxis-add Lovenox    All the records are reviewed and case discussed with Care Management/Social Workerr. Management plans discussed with the patient, family and they are in agreement.  CODE STATUS: Full Code  TOTAL TIME TAKING CARE OF THIS PATIENT: 38 minutes.   POSSIBLE D/C IN 2-3 DAYS, DEPENDING ON CLINICAL  CONDITION.   Gladstone Lighter M.D on 07/27/2017 at 8:21 AM  Between 7am to 6pm - Pager - [phone]  After 6pm go to [email or web address] - password [email or web address] [facility]  Office  [phone]  CC: Primary care physician; Lavonne Chick, MD

## 2017-07-27 NOTE — NC FL2 (Signed)
[place] LEVEL OF CARE SCREENING TOOL     IDENTIFICATION  Patient Name: George Arellano Birthdate: 10/04/1965 Sex: male Admission Date (Current Location): 07/18/2017  [place] and [place] Number:  George Arellano [id number] [place] and Address:  [facility], [address], [place], [place] [address]      Provider Number: [id number]  Attending Physician Name and Address:  Gladstone Lighter, MD  Relative Name and Phone Number:  [name] Mother [phone]     Current Level of Care: Hospital Recommended Level of Care: [place] Prior Approval Number:    Date Approved/Denied:   PASRR Number: Pending  Discharge Plan: SNF    Current Diagnoses: Patient Active Problem List   Diagnosis Date Noted  . Acute respiratory failure with hypoxemia ([place]) 07/18/2017  . Community acquired pneumonia 05/05/2015    Orientation RESPIRATION BLADDER Height & Weight     Self  O2(2L) Continent Weight: 160 lb 8 oz (72.8 kg) Height:  5\' 9"  (175.3 cm)  BEHAVIORAL SYMPTOMS/MOOD NEUROLOGICAL BOWEL NUTRITION STATUS      Continent Diet(Dysphagia diet )  AMBULATORY STATUS COMMUNICATION OF NEEDS Skin   Limited Assist Verbally Normal                       Personal Care Assistance Level of Assistance  Bathing, Feeding, Dressing Bathing Assistance: Limited assistance Feeding assistance: Limited assistance Dressing Assistance: Limited assistance     Functional Limitations Info  Sight, Hearing, Speech Sight Info: Adequate Hearing Info: Adequate Speech Info: Adequate    SPECIAL CARE FACTORS FREQUENCY  PT (By licensed PT), OT (By licensed OT)     PT Frequency: 5x a week OT Frequency: 5x a week            Contractures      Additional Factors Info  Code Status, Allergies Code Status Info: Full Code Allergies Info: LEVAQUIN LEVOFLOXACIN            Current Medications (07/27/2017):  This is the current hospital active  medication list Current Facility-Administered Medications  Medication Dose Route Frequency Provider Last Rate Last Dose  . acetaminophen (TYLENOL) tablet 650 mg  650 mg Oral Q6H PRN Epifanio Lesches, MD       Or  . acetaminophen (TYLENOL) suppository 650 mg  650 mg Rectal Q6H PRN Epifanio Lesches, MD      . aspirin tablet 325 mg  325 mg Oral Daily Leotis Pain, MD   325 mg at 07/27/17 1326  . budesonide (PULMICORT) nebulizer solution 0.25 mg  0.25 mg Nebulization Q6H Wilhelmina Mcardle, MD   0.25 mg at 07/27/17 1612  . chlorhexidine (PERIDEX) 0.12 % solution 15 mL  15 mL Mouth Rinse BID Tukov, Magadalene S, NP   15 mL at 07/27/17 1022  . enoxaparin (LOVENOX) injection 40 mg  40 mg Subcutaneous Q24H Gladstone Lighter, MD      . feeding supplement (ENSURE ENLIVE) (ENSURE ENLIVE) liquid 237 mL  237 mL Oral TID BM Hillary Bow, MD   237 mL at 07/26/17 0937  . folic acid injection 1 mg  1 mg Intravenous Daily Epifanio Lesches, MD   1 mg at 07/27/17 1659  . HYDROcodone-acetaminophen (NORCO/VICODIN) 5-325 MG per tablet 1-2 tablet  1-2 tablet Oral Q4H PRN Epifanio Lesches, MD   2 tablet at 07/19/17 1935  . ipratropium (ATROVENT) nebulizer solution 0.5 mg  0.5 mg Nebulization Q6H Wilhelmina Mcardle, MD   0.5 mg at 07/27/17 1612  . levalbuterol (  XOPENEX) nebulizer solution 0.63 mg  0.63 mg Nebulization Q6H Epifanio Lesches, MD   0.63 mg at 07/27/17 1613  . LORazepam (ATIVAN) injection 1-2 mg  1-2 mg Intravenous Q4H PRN Dorene Sorrow S, NP   2 mg at 07/27/17 0328  . MEDLINE mouth rinse  15 mL Mouth Rinse q12n4p Tukov, Magadalene S, NP   15 mL at 07/26/17 1811  . multivitamin with minerals tablet 1 tablet  1 tablet Oral Daily Conforti, John, DO   1 tablet at 07/27/17 1022  . ondansetron (ZOFRAN) injection 4 mg  4 mg Intravenous Q6H PRN Epifanio Lesches, MD   4 mg at 07/26/17 1956  . piperacillin-tazobactam (ZOSYN) IVPB 3.375 g  3.375 g Intravenous Q8H Hillary Bow, MD    Stopped at 07/27/17 1810  . thiamine (B-1) injection 100 mg  100 mg Intravenous Daily Epifanio Lesches, MD   100 mg at 07/27/17 1022     Discharge Medications: Please see discharge summary for a list of discharge medications.  Relevant Imaging Results:  Relevant Lab Results:   Additional Information SSN [id number]  Ross Ludwig, [place]

## 2017-07-28 LAB — GLUCOSE, CAPILLARY
GLUCOSE-CAPILLARY: 91 mg/dL (ref 65–99)
GLUCOSE-CAPILLARY: 90 mg/dL (ref 65–99)
GLUCOSE-CAPILLARY: 124 mg/dL — AB (ref 65–99)
GLUCOSE-CAPILLARY: 124 mg/dL — AB (ref 65–99)

## 2017-07-28 LAB — BASIC METABOLIC PANEL
Anion gap: 8 (ref 5–15)
BUN: 20 mg/dL (ref 6–20)
CHLORIDE: 104 mmol/L (ref 101–111)
CO2: 24 mmol/L (ref 22–32)
Calcium: 8.4 mg/dL — ABNORMAL LOW (ref 8.9–10.3)
Creatinine, Ser: 0.82 mg/dL (ref 0.61–1.24)
GFR calc non Af Amer: >60 mL/min (ref >60)
Glucose, Bld: 95 mg/dL (ref 65–99)
POTASSIUM: 3.3 mmol/L — AB (ref 3.5–5.1)
SODIUM: 136 mmol/L (ref 135–145)

## 2017-07-28 LAB — MAGNESIUM: MAGNESIUM: 1.5 mg/dL — AB (ref 1.7–2.4)

## 2017-07-28 MED ORDER — METOPROLOL TARTRATE 25 MG PO TABS
25.0000 mg | ORAL_TABLET | Freq: Two times a day (BID) | ORAL | Status: DC
  Administered 2017-07-28 – 2017-07-30 (×4): 25 mg via ORAL
  Filled 2017-07-28 (×6): qty 1

## 2017-07-28 MED ORDER — MAGNESIUM SULFATE 4 GM/100ML IV SOLN
4.0000 g | Freq: Once | INTRAVENOUS | Status: AC
  Administered 2017-07-28: 08:00:00 4 g via INTRAVENOUS
  Filled 2017-07-28: qty 100

## 2017-07-28 MED ORDER — LORAZEPAM 1 MG PO TABS
1.0000 mg | ORAL_TABLET | Freq: Four times a day (QID) | ORAL | Status: DC | PRN
  Administered 2017-07-28 – 2017-07-31 (×3): 1 mg via ORAL
  Filled 2017-07-28 (×4): qty 1

## 2017-07-28 MED ORDER — TRAZODONE HCL 50 MG PO TABS
100.0000 mg | ORAL_TABLET | Freq: Every day | ORAL | Status: DC
  Administered 2017-07-28 – 2017-07-31 (×4): 100 mg via ORAL
  Filled 2017-07-28 (×4): qty 2

## 2017-07-28 MED ORDER — FOLIC ACID 1 MG PO TABS
1.0000 mg | ORAL_TABLET | Freq: Every day | ORAL | Status: DC
  Administered 2017-07-28 – 2017-08-01 (×5): 1 mg via ORAL
  Filled 2017-07-28 (×5): qty 1

## 2017-07-28 MED ORDER — POTASSIUM CHLORIDE 20 MEQ PO PACK
40.0000 meq | PACK | Freq: Once | ORAL | Status: AC
  Administered 2017-07-28: 09:00:00 40 meq via ORAL
  Filled 2017-07-28: qty 2

## 2017-07-28 MED ORDER — VITAMIN B-1 100 MG PO TABS
100.0000 mg | ORAL_TABLET | Freq: Every day | ORAL | Status: DC
  Administered 2017-07-28 – 2017-08-01 (×5): 100 mg via ORAL
  Filled 2017-07-28 (×5): qty 1

## 2017-07-28 MED ORDER — ATORVASTATIN CALCIUM 20 MG PO TABS
40.0000 mg | ORAL_TABLET | Freq: Every day | ORAL | Status: DC
  Administered 2017-07-28 – 2017-07-31 (×4): 40 mg via ORAL
  Filled 2017-07-28 (×4): qty 2

## 2017-07-28 MED ORDER — IPRATROPIUM-ALBUTEROL 0.5-2.5 (3) MG/3ML IN SOLN
3.0000 mL | Freq: Four times a day (QID) | RESPIRATORY_TRACT | Status: DC
  Administered 2017-07-28 – 2017-07-30 (×6): 3 mL via RESPIRATORY_TRACT
  Filled 2017-07-28 (×6): qty 3

## 2017-07-28 MED ORDER — BUDESONIDE 0.5 MG/2ML IN SUSP
0.5000 mg | Freq: Two times a day (BID) | RESPIRATORY_TRACT | Status: DC
  Administered 2017-07-29 – 2017-08-01 (×7): 0.5 mg via RESPIRATORY_TRACT
  Filled 2017-07-28 (×7): qty 2

## 2017-07-28 NOTE — Progress Notes (Signed)
[facility] NAME: George Arellano    MR#:  [id number]  DATE OF BIRTH:  1966/06/07  SUBJECTIVE:  CHIEF COMPLAINT:   Chief Complaint  Patient presents with  . Shortness of Breath  . Alcohol Intoxication   - More alert, less confusion, no hallucinations at this time. -Complains of cough. Also blurred vision  REVIEW OF SYSTEMS:  Review of Systems  Constitutional: Negative for chills, fever and malaise/fatigue.  HENT: Negative for congestion, ear discharge, hearing loss and nosebleeds.   Eyes: Positive for blurred vision. Negative for double vision.  Respiratory: Positive for cough. Negative for shortness of breath and wheezing.   Cardiovascular: Negative for chest pain and palpitations.  Gastrointestinal: Negative for abdominal pain, constipation, diarrhea, nausea and vomiting.  Genitourinary: Negative for dysuria.  Musculoskeletal: Negative for myalgias.  Neurological: Negative for dizziness, sensory change, focal weakness, seizures and headaches.  Psychiatric/Behavioral: Negative for hallucinations.    DRUG ALLERGIES:   Allergies  Allergen Reactions  . Levaquin [Levofloxacin] Other (See Comments)    Reaction:  Unknown     VITALS:  Blood pressure 119/67, pulse 96, temperature 98.1 F (36.7 C), temperature source Oral, resp. rate 20, height 5\' 9"  (1.753 m), weight 71.7 kg (158 lb 1.6 oz), SpO2 98 %.  PHYSICAL EXAMINATION:  Physical Exam  GENERAL:  52 y.o.-year-old patient lying in the bed with no acute distress. disheleved appearing EYES: Pupils equal, round, reactive to light and accommodation. No scleral icterus. Extraocular muscles intact.  HEENT: Head atraumatic, normocephalic. Oropharynx and nasopharynx clear.  NECK:  Supple, no jugular venous distention. No thyroid enlargement, no tenderness.  LUNGS: Normal breath sounds bilaterally, no wheezing, rales,rhonchi or crepitation. No use of accessory muscles of  respiration. Decreased bibasilar breath sounds CARDIOVASCULAR: S1, S2 normal. No murmurs, rubs, or gallops.  ABDOMEN: Soft, nontender, nondistended. Bowel sounds present. No organomegaly or mass.  EXTREMITIES: No pedal edema, cyanosis, or clubbing.  NEUROLOGIC: Cranial nerves II through XII are intact. Decreased visual acuity Muscle strength 5/5 in all extremities. Sensation intact. Gait not checked.  PSYCHIATRIC: The patient is alert and oriented x 3.  SKIN: No obvious rash, lesion, or ulcer.    LABORATORY PANEL:   CBC Recent Labs  Lab 07/26/17 0644  WBC 11.5*  HGB 12.5*  HCT 37.2*  PLT 96*   ------------------------------------------------------------------------------------------------------------------  Chemistries  Recent Labs  Lab 07/26/17 0644  07/28/17 0402  NA 142   < > 136  K 3.2*   < > 3.3*  CL 103   < > 104  CO2 32   < > 24  GLUCOSE 108*   < > 95  BUN 31*   < > 20  CREATININE 1.18   < > 0.82  CALCIUM 8.4*   < > 8.4*  MG 1.5*   < > 1.5*  AST 69*  --   --   ALT 81*  --   --   ALKPHOS 68  --   --   BILITOT 1.9*  --   --    < > = values in this interval not displayed.   ------------------------------------------------------------------------------------------------------------------  Cardiac Enzymes No results for input(s): TROPONINI in the last 168 hours. ------------------------------------------------------------------------------------------------------------------  RADIOLOGY:  Dg Eye Foreign Body  Result Date: 07/27/2017 CLINICAL DATA:  Metal working/exposure; clearance prior to MRI EXAM: ORBITS FOR FOREIGN BODY - 2 VIEW COMPARISON:  None. FINDINGS: Water's views with eyes deviated toward the left and toward the right obtained. No  intraorbital radiopaque foreign body. No fracture or dislocation. Paranasal sinuses are clear. There is leftward deviation of the nasal septum. IMPRESSION: No evidence of metallic foreign body within the orbits. Electronically  Signed   By: Lowella Grip III M.D.   On: 07/27/2017 11:49   Dg Chest 2 View  Result Date: 07/27/2017 CLINICAL DATA:  Pneumonia EXAM: CHEST  2 VIEW COMPARISON:  07/26/2017 FINDINGS: Bibasilar airspace unchanged. Left upper lobe infiltrate unchanged. Small bilateral pleural effusions unchanged. Negative for heart failure. IMPRESSION: Bilateral airspace disease unchanged, probable pneumonia Electronically Signed   By: Franchot Gallo M.D.   On: 07/27/2017 09:51   Ct Head Wo Contrast  Result Date: 07/26/2017 CLINICAL DATA:  AMS unexplained. Admitted for septic shock secondary to pneumonia also found to have acute renal failure, alcohol withdrawal, COPD exacerbation, hypokalemia. EXAM: CT HEAD WITHOUT CONTRAST TECHNIQUE: Contiguous axial images were obtained from the base of the skull through the vertex without intravenous contrast. COMPARISON:  09/05/2014 FINDINGS: Brain: There is well-defined hypoattenuation that extends from the right posterior parietal lobe throughout most of the right occipital lobe into the posterior to mid right temporal lobe. A smaller area of hypoattenuation is noted in the right thalamus extending to the posterior limb of the right internal capsule. There is mass effect associated with the hypoattenuation, effacing the atrium and occipital horn of the right lateral ventricle, displacing it anteriorly, as well as effacing overlying sulci. Additionally, there are small areas of hypoattenuation in both cerebellar hemispheres superiorly. These findings are new from the prior exam. The ventricles are normal in overall size and configuration. There are no parenchymal masses. There are no extra-axial masses or abnormal fluid collections. There is no intracranial hemorrhage. Vascular: No hyperdense vessel or unexpected calcification. Skull: Normal. Negative for fracture or focal lesion. Sinuses/Orbits: Globes and orbits are unremarkable. The visualized sinuses and mastoid air cells are  clear Other: None IMPRESSION: 1. Subacute infarctions. The largest area of infarction involves the right posterior cerebral artery distribution as detailed above. There are smaller subacute infarcts involving the right and left superior cerebellar hemispheres. 2. No intracranial hemorrhage. Electronically Signed   By: Lajean Manes M.D.   On: 07/26/2017 15:30   Mr Jodene Nam Head Wo Contrast  Result Date: 07/27/2017 CLINICAL DATA:  52 year old male presenting with shortness of breath, confusion and altered mental status. Hypotensive and tachycardic, suspected sepsis. Onset of left side weakness and visual defects following admission. Head CT reveals cerebellar and right PCA territory infarcts. EXAM: MRI HEAD WITHOUT CONTRAST MRA HEAD WITHOUT CONTRAST TECHNIQUE: Multiplanar, multiecho pulse sequences of the brain and surrounding structures were obtained without intravenous contrast. Angiographic images of the head were obtained using MRA technique without contrast. COMPARISON:  Head CT 07/26/2017, 09/05/2014. FINDINGS: MRI HEAD FINDINGS Brain: Patchy restricted diffusion scattered in both cerebellar hemispheres, relatively sparing the inferior cerebellum (PICA territory). Large volume restricted diffusion throughout the right PCA territory including involvement of the right mesial temporal lobe and right thalamus. Scattered small foci of restricted diffusion in the left occipital lobe and left inferior parietal lobe near the left parieto-occipital sulcus. No anterior circulation restricted diffusion. Cytotoxic edema in the acutely affected areas with right PCA territory gyral swelling and partial effacement of the right lateral ventricle, but otherwise no significant mass effect. No associated blood products are identified. Elsewhere gray and white matter signal is normal. No midline shift, evidence of mass lesion, ventriculomegaly, extra-axial collection or acute intracranial hemorrhage. Cervicomedullary junction and  pituitary are within normal limits. Vascular:  Major intracranial vascular flow voids are preserved, the distal right vertebral artery appears dominant. See also MRA findings below. Skull and upper cervical spine: Negative visualized cervical spine. Sinuses/Orbits: Mildly Disconjugate gaze. Otherwise normal orbits soft tissues. Mild right maxillary sinus mucosal thickening. Other: Visible internal auditory structures appear normal. Scalp and face soft tissues appear negative. MRA HEAD FINDINGS Antegrade flow in the posterior circulation with dominant distal right vertebral artery. Patent right PICA. Diminutive distal left vertebral artery with partially visible but patent appearing left PICA origin. The left vertebral appears to functionally terminates in the PICA. Patent basilar artery with mild irregularity but no stenosis. SCA origins are patent. The right PCA is occluded in the P2 segment. Diminutive or absent right posterior communicating artery. The left posterior communicating artery is present, and the left PCA is patent proximally. No definite left PCA branch occlusion. Antegrade flow in both ICA siphons. Mild to moderate siphon irregularity compatible with atherosclerosis. Only mild siphon stenosis suspected. Normal ophthalmic and left posterior communicating artery origins. Patent carotid termini. MCA and ACA origins are within normal limits. Mild A1 and M1 irregularity without suspected significant stenosis. The anterior communicating artery is present. Visible bilateral MCA and ACA branches are within normal limits. IMPRESSION: 1. Right PCA occlusion with large acute right PCA territory infarct. Patchy acute infarcts in the left PCA and bilateral cerebellar artery territories. 2. Cytotoxic and gyral edema associated with #1. No associated hemorrhage or significant intracranial mass effect. 3. MRA suggests generalized intracranial atherosclerosis, but no other intracranial artery occlusion or significant  stenosis. Electronically Signed   By: Genevie Ann M.D.   On: 07/27/2017 12:54   Mr Brain Wo Contrast  Result Date: 07/27/2017 CLINICAL DATA:  52 year old male presenting with shortness of breath, confusion and altered mental status. Hypotensive and tachycardic, suspected sepsis. Onset of left side weakness and visual defects following admission. Head CT reveals cerebellar and right PCA territory infarcts. EXAM: MRI HEAD WITHOUT CONTRAST MRA HEAD WITHOUT CONTRAST TECHNIQUE: Multiplanar, multiecho pulse sequences of the brain and surrounding structures were obtained without intravenous contrast. Angiographic images of the head were obtained using MRA technique without contrast. COMPARISON:  Head CT 07/26/2017, 09/05/2014. FINDINGS: MRI HEAD FINDINGS Brain: Patchy restricted diffusion scattered in both cerebellar hemispheres, relatively sparing the inferior cerebellum (PICA territory). Large volume restricted diffusion throughout the right PCA territory including involvement of the right mesial temporal lobe and right thalamus. Scattered small foci of restricted diffusion in the left occipital lobe and left inferior parietal lobe near the left parieto-occipital sulcus. No anterior circulation restricted diffusion. Cytotoxic edema in the acutely affected areas with right PCA territory gyral swelling and partial effacement of the right lateral ventricle, but otherwise no significant mass effect. No associated blood products are identified. Elsewhere gray and white matter signal is normal. No midline shift, evidence of mass lesion, ventriculomegaly, extra-axial collection or acute intracranial hemorrhage. Cervicomedullary junction and pituitary are within normal limits. Vascular: Major intracranial vascular flow voids are preserved, the distal right vertebral artery appears dominant. See also MRA findings below. Skull and upper cervical spine: Negative visualized cervical spine. Sinuses/Orbits: Mildly Disconjugate gaze.  Otherwise normal orbits soft tissues. Mild right maxillary sinus mucosal thickening. Other: Visible internal auditory structures appear normal. Scalp and face soft tissues appear negative. MRA HEAD FINDINGS Antegrade flow in the posterior circulation with dominant distal right vertebral artery. Patent right PICA. Diminutive distal left vertebral artery with partially visible but patent appearing left PICA origin. The left vertebral appears to functionally terminates  in the PICA. Patent basilar artery with mild irregularity but no stenosis. SCA origins are patent. The right PCA is occluded in the P2 segment. Diminutive or absent right posterior communicating artery. The left posterior communicating artery is present, and the left PCA is patent proximally. No definite left PCA branch occlusion. Antegrade flow in both ICA siphons. Mild to moderate siphon irregularity compatible with atherosclerosis. Only mild siphon stenosis suspected. Normal ophthalmic and left posterior communicating artery origins. Patent carotid termini. MCA and ACA origins are within normal limits. Mild A1 and M1 irregularity without suspected significant stenosis. The anterior communicating artery is present. Visible bilateral MCA and ACA branches are within normal limits. IMPRESSION: 1. Right PCA occlusion with large acute right PCA territory infarct. Patchy acute infarcts in the left PCA and bilateral cerebellar artery territories. 2. Cytotoxic and gyral edema associated with #1. No associated hemorrhage or significant intracranial mass effect. 3. MRA suggests generalized intracranial atherosclerosis, but no other intracranial artery occlusion or significant stenosis. Electronically Signed   By: Genevie Ann M.D.   On: 07/27/2017 12:54   US Carotid Bilateral  Result Date: 07/26/2017 CLINICAL DATA:  53 year old male with symptoms of cerebrovascular accident EXAM: BILATERAL CAROTID DUPLEX ULTRASOUND TECHNIQUE: Pearline Cables scale imaging, color Doppler and  duplex ultrasound were performed of bilateral carotid and vertebral arteries in the neck. COMPARISON:  Head CT 07/26/2017 FINDINGS: Criteria: Quantification of carotid stenosis is based on velocity parameters that correlate the residual internal carotid diameter with NASCET-based stenosis levels, using the diameter of the distal internal carotid lumen as the denominator for stenosis measurement. The following velocity measurements were obtained: RIGHT ICA:  201/69 cm/sec CCA:  30/09 cm/sec SYSTOLIC ICA/CCA RATIO:  2.1 DIASTOLIC ICA/CCA RATIO:  4.5 ECA:  117 cm/sec LEFT ICA:  198/74 cm/sec CCA:  233/00 cm/sec SYSTOLIC ICA/CCA RATIO:  1.9 DIASTOLIC ICA/CCA RATIO:  3.2 ECA:  106 cm/sec RIGHT CAROTID ARTERY: Heterogeneous atherosclerotic plaque in the proximal internal carotid artery is irregular. There elevation of the peak systolic velocity along with spectral broadening consistent with a moderate (50-69%) stenosis. RIGHT VERTEBRAL ARTERY:  Patent with antegrade flow. LEFT CAROTID ARTERY: Focal heterogeneous atherosclerotic plaque in the proximal internal carotid artery resulting in elevation of the peak systolic velocity consistent with a moderate (50-69%) stenosis. LEFT VERTEBRAL ARTERY: Patent with antegrade flow. However, there is early systolic deceleration consistent with incomplete steal. IMPRESSION: 1. Moderate (50-69%) stenosis proximal right internal carotid artery secondary to heterogeneous and irregular atherosclerotic plaque. 2. Moderate (50-69%) stenosis proximal left internal carotid artery secondary to heterogeneous atherosclerotic plaque. 3. 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 This suggests a more proximal stenosis in the left subclavian artery. Does the patient have clinical symptoms of left upper extremity claudication, or vertebrobasilar symptoms? 4. Normal antegrade flow in the right vertebral artery. Signed, Criselda Peaches, MD [facility]  [facility] Electronically Signed   By: Jacqulynn Cadet M.D.   On: 07/26/2017 17:52    EKG:   Orders placed or performed during the hospital encounter of 07/18/17  . ED EKG 12-Lead  . ED EKG 12-Lead  . EKG 12-Lead  . EKG 12-Lead    ASSESSMENT AND PLAN:   52 year old male with known history of COPD on 2 L home oxygen, heavy alcohol use, smoking, hypertension brought into the hospital secondary to worsening shortness of breath and was noted to be hypoxic and hypotensive.  1. Sub acute bilateral Cerebellar infarcts-could have happened on admission and patient presented with hypoxia. -MRI  and MRA of the brain with Right PCA occlusion with large acute right PCA territory infarct. Patchy acute infarcts in the left PCA and bilateral cerebellar artery territories. Cytotoxic and gyral edema associated with stroke. No associated hemorrhage or significant intracranial mass effect.  -Carotid Dopplers done with no hemodynamically significant stenosis. -Appreciate neuro consult. Continue aspirin and statin -PT consult recommended rehabilitation -Explains his blurred vision from his stroke  2. Acute hypoxic and hypercarbic respiratory failure on admission-secondary to COPD exacerbation and bilateral pneumonia -Also presented with septic shock that has been resolved. Off pressors now -off steroids, finished  Zosyn for aspiration pneumonia.  -We'll discontinue BiPAP and try to liters oxygen at bedtime which is chronic for patient -Blood cultures have remained negative. - continue duonebs, add inhaler  3. Acute renal failure-ATN from sepsis and also rhabdomyolysis. -Improved after fluids.  4. Acute metabolic encephalopathy-secondary to alcohol withdrawal symptoms and stroke and sepsis on admission -Much improving and close to baseline now.  5. Hypokalemia and hypomagnesemia- replaced and monitor.  6. DVT prophylaxis-on Lovenox   Updated mother at bedside Social worker looking  for short-term rehabilitation    All the records are reviewed and case discussed with Care Management/Social Workerr. Management plans discussed with the patient, family and they are in agreement.  CODE STATUS: Full Code  TOTAL TIME TAKING CARE OF THIS PATIENT: 38 minutes.   POSSIBLE D/C IN 2-3 DAYS, DEPENDING ON CLINICAL CONDITION.   Gladstone Lighter M.D on 07/28/2017 at 3:10 PM  Between 7am to 6pm - Pager - [phone]  After 6pm go to [email or web address] - password [email or web address] [facility]  Office  [phone]  CC: Primary care physician; Lavonne Chick, MD

## 2017-07-28 NOTE — Clinical Social Work Note (Signed)
PT is recommending SNF, CSW explained to patient that because he only has Medicaid there are going to be a limited amount of SNFs that are able to accept patient.  CSW explained to patient that the SNF will most likely be out of county, and he would have to stay 30 days.  Patient expressed understanding and said that he is okay with having to go to a SNF and staying there.  Patient expressed that he has had two heart attacks and two strokes, and feels very weak.  Patient expressed that he wants to do whatever he has to in order to get strong again.  CSW faxed patient's information to SNFs in [place], [place], [place], and [place] awaiting for bed offers.  CSW also contacted admissions worker at [facility] and [place], [facility], [place], [facility] in [place], and [facility] in [place], they said they will review and update CSW if they can accept patient.  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  CSW to continue to follow patient's progress throughout discharge planning.  Jones Broom. Norval Morton, MSW, [address]  07/28/2017 6:32 PM

## 2017-07-28 NOTE — Progress Notes (Addendum)
Physical Therapy Treatment Patient Details Name: George Arellano MRN: [id number] DOB: [birth year] Today's Date: 07/28/2017    History of Present Illness 52yo male presented to ER secondary to AMS, worsening SOB; admitted with septic shock, severe sepsis related to PNA.  Initially requiring BiPAP, now weaned to 2L.    PT Comments    Participated in exercises as described below.  Pt with generally good strength tolerating resistive exercises for all ranges LLE. 5/5 strength.  Sitting balance activities in recliner without back support reaching outside BOS.  Pt generally unsteady with far reaches regarding min guard.  Pt presents with visual field cut and despite cueing has difficulty tracking and locating objects.  Pt was able to stand with min assist +2 from recliner with mod verbal and tactile cues for hand placements.  He was able to ambulate 40' x 2 with walker and min A x 2 with recliner follow.  Pt generally unsteady with forward lean at times, staggers left and right and requires assist at times for navigating walker.  Pt remains high fall risk and is unsafe for mobility skills without +2 assist.     Follow Up Recommendations  SNF     Equipment Recommendations  Rolling walker with 5" wheels    Recommendations for Other Services       Precautions / Restrictions Precautions Precautions: Fall Restrictions Weight Bearing Restrictions: No    Mobility  Bed Mobility               General bed mobility comments: in recliner  Transfers Overall transfer level: Needs assistance Equipment used: Rolling walker (2 wheeled) Transfers: Sit to/from Stand Sit to Stand: +2 physical assistance;Min assist         General transfer comment: mod verbal and tactile cues for hand placements, generally unsteady upon standing  Ambulation/Gait Ambulation/Gait assistance: Min assist;+2 physical assistance Ambulation Distance (Feet): 40 Feet Assistive device: Rolling walker (2 wheeled) Gait  Pattern/deviations: Step-through pattern;Staggering left;Staggering right;Ataxic;Decreased stance time - left   Gait velocity interpretation: Below normal speed for age/gender General Gait Details: Generally unsteady and unsafe gait, high fall risk.   +2 for mobility   Stairs            Wheelchair Mobility    Modified Rankin (Stroke Patients Only)       Balance Overall balance assessment: Needs assistance Sitting-balance support: No upper extremity supported;Feet supported Sitting balance-Leahy Scale: Fair       Standing balance-Leahy Scale: Poor Standing balance comment: generally unsteady with static and dynamic mobility                            Cognition Arousal/Alertness: Awake/alert Behavior During Therapy: WFL for tasks assessed/performed Overall Cognitive Status: Within Functional Limits for tasks assessed                                        Exercises Other Exercises Other Exercises: Seated AROM/Resisted exercises 2 x 10 for ankle pumps, LAQ, marches ab/adduction Other Exercises: seated balance activities unsupported reaching outside BOS - pt presents with visual field deficits    General Comments        Pertinent Vitals/Pain Pain Assessment: No/denies pain    Home Living                      Prior Function  PT Goals (current goals can now be found in the care plan section) Progress towards PT goals: Progressing toward goals    Frequency    7X/week      PT Plan Current plan remains appropriate    Co-evaluation              AM-PAC PT "6 Clicks" Daily Activity  Outcome Measure  Difficulty turning over in bed (including adjusting bedclothes, sheets and blankets)?: Unable Difficulty moving from lying on back to sitting on the side of the bed? : Unable Difficulty sitting down on and standing up from a chair with arms (e.g., wheelchair, bedside commode, etc,.)?: Unable Help needed  moving to and from a bed to chair (including a wheelchair)?: A Lot Help needed walking in hospital room?: A Lot Help needed climbing 3-5 steps with a railing? : Total 6 Click Score: 8    End of Session Equipment Utilized During Treatment: Gait belt Activity Tolerance: Patient tolerated treatment well Patient left: in chair;with chair alarm set;with call bell/phone within reach;with family/visitor present         Time: 1345-1414 PT Time Calculation (min) (ACUTE ONLY): 29 min  Charges:  $Gait Training: 8-22 mins $Therapeutic Exercise: 8-22 mins                    G Codes:       Chesley Noon, PTA 07/28/17, 3:30 PM

## 2017-07-28 NOTE — Progress Notes (Signed)
* [name]     Assessment and Plan: Acute hypoxemic respiratory failure, due to pneumonia/acute exacerbation of COPD.  Patient has been weaned down to RA.  Underlying alcoholic cirrhosis. Deconditioning/ Weakness.  CVA  Advance activity as tolerated.  Incentive spirometry.  Will DC bipap.   Pip-Tazo for total 7-day course. Outpatient pulmonary and sleep follow-up. Pt was given my card and asked to follow up outpatient.  Long acting inhaler such as symbicort upon discharge.   Antibiotics: Zosyn 1/10>>  Micro: MRSA PCR negative 07/18/17 Blood culture -07/18/17  x 2. Urine culture negative 07/18/17  Pulmonary service will sign off for now please call if there are any issues or concerns.    Date: 07/28/2017  MRN# [id number] George Arellano [birth year]   George Arellano is a 52 y.o. old male seen in follow up for chief complaint of  Chief Complaint  Patient presents with  . Shortness of Breath  . Alcohol Intoxication     HPI:   Pt resting comfortably/sleeping. No distress. Sitting up in chair. On RA.   chest x-ray 07/26/17; continued lower middle lobe, left lingular infiltrate, CT chest 07/19/17, shows presence of pneumonia and above-mentioned segments, apical emphysema.   Medication:    Current Facility-Administered Medications:  .  acetaminophen (TYLENOL) tablet 650 mg, 650 mg, Oral, Q6H PRN **OR** acetaminophen (TYLENOL) suppository 650 mg, 650 mg, Rectal, Q6H PRN, Epifanio Lesches, MD .  aspirin tablet 325 mg, 325 mg, Oral, Daily, Leotis Pain, MD, 325 mg at 07/28/17 0925 .  budesonide (PULMICORT) nebulizer solution 0.25 mg, 0.25 mg, Nebulization, Q6H, Wilhelmina Mcardle, MD, 0.25 mg at 07/28/17 0813 .  chlorhexidine (PERIDEX) 0.12 % solution 15 mL, 15 mL, Mouth Rinse, BID, Tukov, Magadalene S, NP, 15 mL at 07/27/17 2131 .  enoxaparin (LOVENOX) injection 40 mg, 40 mg, Subcutaneous, Q24H, Kalisetti, Radhika, MD, 40 mg at 07/27/17 2131 .  feeding  supplement (ENSURE ENLIVE) (ENSURE ENLIVE) liquid 237 mL, 237 mL, Oral, TID BM, Sudini, Srikar, MD, 237 mL at 07/27/17 2130 .  folic acid injection 1 mg, 1 mg, Intravenous, Daily, Epifanio Lesches, MD, 1 mg at 07/27/17 1659 .  HYDROcodone-acetaminophen (NORCO/VICODIN) 5-325 MG per tablet 1-2 tablet, 1-2 tablet, Oral, Q4H PRN, Epifanio Lesches, MD, 2 tablet at 07/19/17 1935 .  ipratropium (ATROVENT) nebulizer solution 0.5 mg, 0.5 mg, Nebulization, Q6H, Wilhelmina Mcardle, MD, 0.5 mg at 07/28/17 0813 .  levalbuterol (XOPENEX) nebulizer solution 0.63 mg, 0.63 mg, Nebulization, Q6H, Epifanio Lesches, MD, 0.63 mg at 07/28/17 0813 .  LORazepam (ATIVAN) injection 1-2 mg, 1-2 mg, Intravenous, Q4H PRN, Tukov, Magadalene S, NP, 2 mg at 07/27/17 0328 .  magnesium sulfate IVPB 4 g 100 mL, 4 g, Intravenous, Once, Hallaji, Sheema M, RPH, Last Rate: 50 mL/hr at 07/28/17 0810, 4 g at 07/28/17 0810 .  MEDLINE mouth rinse, 15 mL, Mouth Rinse, q12n4p, Tukov, Magadalene S, NP, 15 mL at 07/26/17 1811 .  multivitamin with minerals tablet 1 tablet, 1 tablet, Oral, Daily, Conforti, John, DO, 1 tablet at 07/28/17 0916 .  [DISCONTINUED] ondansetron (ZOFRAN) tablet 4 mg, 4 mg, Oral, Q6H PRN **OR** ondansetron (ZOFRAN) injection 4 mg, 4 mg, Intravenous, Q6H PRN, Epifanio Lesches, MD, 4 mg at 07/26/17 1956 .  thiamine (B-1) injection 100 mg, 100 mg, Intravenous, Daily, Epifanio Lesches, MD, 100 mg at 07/27/17 1022   Allergies:  Levaquin [levofloxacin]  Review of Systems: Gen:  Denies  fever, sweats. HEENT: Denies blurred vision. Cvc:  No dizziness, chest pain or heaviness  Resp:   Denies cough or sputum porduction. Gi: Denies swallowing difficulty, stomach pain. constipation, bowel incontinence Gu:  Denies bladder incontinence, burning urine Ext:   No Joint pain, stiffness. Skin: No skin rash, easy bruising. Endoc:  No polyuria, polydipsia. Psych: No depression, insomnia. Other:  All other systems  were reviewed and found to be negative other than what is mentioned in the HPI.   Physical Examination:   VS: BP 111/72 (BP Location: Left Arm)   Pulse 99   Temp 99.2 F (37.3 C) (Oral)   Resp 17   Ht 5\' 9"  (1.753 m)   Wt 158 lb 1.6 oz (71.7 kg)   SpO2 96%   BMI 23.35 kg/m    General Appearance: No distress  Neuro:without focal findings. HEENT: PERRLA, EOM intact. Pulmonary: normal breath sounds, scattered bilat wheezing, better than yesterday. Decreased air entry bilaterally. CardiovascularNormal S1,S2.  No m/r/g.   Abdomen: Benign, Soft, non-tender. Renal:  No costovertebral tenderness  GU:  Not performed at this time. Endoc: No evident thyromegaly, no signs of acromegaly. Skin:   warm, no rash. Extremities: normal, no cyanosis, clubbing.   LABORATORY PANEL:   CBC Recent Labs  Lab 07/26/17 0644  WBC 11.5*  HGB 12.5*  HCT 37.2*  PLT 96*   ------------------------------------------------------------------------------------------------------------------  Chemistries  Recent Labs  Lab 07/26/17 0644  07/28/17 0402  NA 142   < > 136  K 3.2*   < > 3.3*  CL 103   < > 104  CO2 32   < > 24  GLUCOSE 108*   < > 95  BUN 31*   < > 20  CREATININE 1.18   < > 0.82  CALCIUM 8.4*   < > 8.4*  MG 1.5*   < > 1.5*  AST 69*  --   --   ALT 81*  --   --   ALKPHOS 68  --   --   BILITOT 1.9*  --   --    < > = values in this interval not displayed.   ------------------------------------------------------------------------------------------------------------------  Cardiac Enzymes No results for input(s): TROPONINI in the last 168 hours. ------------------------------------------------------------  RADIOLOGY:   No results found for this or any previous visit. Results for orders placed during the hospital encounter of 05/05/15  DG Chest 2 View   Narrative CLINICAL DATA:  Cough, shortness of breath and nausea for 3 days, history hypertension, CHF, COPD,  smoker  EXAM: CHEST  2 VIEW  COMPARISON:  09/25/2014  FINDINGS: Normal heart size, mediastinal contours and pulmonary vascularity.  Increased perihilar markings bilaterally question infiltrate.  No pleural effusion or pneumothorax.  Bones unremarkable.  Mild atherosclerotic calcification aortic arch.  IMPRESSION: Bronchitic changes with questionable perihilar infiltrates.   Electronically Signed   By: Lavonia Dana M.D.   On: 05/05/2015 17:06    ------------------------------------------------------------------------------------------------------------------  Thank  you for allowing [facility] to assist in the care of your patient. Our recommendations are noted above.  Please contact us if we can be of further service.   Marda Stalker, MD.  [facility] Office Number: [phone]  Patricia Pesa, M.D.  Merton Border, M.D  07/28/2017

## 2017-07-28 NOTE — Progress Notes (Signed)
Pharmacy Electrolyte Monitoring Consult:  Pharmacy consulted to assist in monitoring and replacing electrolytes in this 52 y.o. male admitted on 07/18/2017 with Shortness of Breath and Alcohol Intoxication   Labs:  Sodium (mmol/L)  Date Value  07/28/2017 136  06/01/2014 136   Potassium (mmol/L)  Date Value  07/28/2017 3.3 (L)  06/01/2014 4.1   Magnesium (mg/dL)  Date Value  07/28/2017 1.5 (L)  11/23/2013 2.1   Phosphorus (mg/dL)  Date Value  07/24/2017 4.0   Calcium (mg/dL)  Date Value  07/28/2017 8.4 (L)   Calcium, Total (mg/dL)  Date Value  06/01/2014 8.5   Albumin (g/dL)  Date Value  07/26/2017 2.4 (L)  06/04/2014 2.6 (L)    Assessment/Plan: Mg remains at 1.5 after two days of replacement with Mag Sulfate 2gm IV. Will order Mag sulfate 4g IV x 1 dose.  K+ is 3.3, will replace with 46mEq PO x 1 dose. Recheck electrolytes with AM labs.   Pernell Dupre, PharmD, BCPS Clinical Pharmacist 07/28/2017 7:23 AM

## 2017-07-29 LAB — CBC
HEMATOCRIT: 34.6 % — AB (ref 40.0–52.0)
HEMOGLOBIN: 12.1 g/dL — AB (ref 13.0–18.0)
MCH: 35 pg — ABNORMAL HIGH (ref 26.0–34.0)
MCHC: 35.1 g/dL (ref 32.0–36.0)
MCV: 99.7 fL (ref 80.0–100.0)
Platelets: 159 10*3/uL (ref 150–440)
RBC: 3.47 MIL/uL — ABNORMAL LOW (ref 4.40–5.90)
RDW: 12.4 % (ref 11.5–14.5)
WBC: 7.7 10*3/uL (ref 3.8–10.6)

## 2017-07-29 LAB — GLUCOSE, CAPILLARY
Glucose-Capillary: 89 mg/dL (ref 65–99)
Glucose-Capillary: 112 mg/dL — ABNORMAL HIGH (ref 65–99)
Glucose-Capillary: 120 mg/dL — ABNORMAL HIGH (ref 65–99)

## 2017-07-29 LAB — POTASSIUM: POTASSIUM: 3.6 mmol/L (ref 3.5–5.1)

## 2017-07-29 LAB — MAGNESIUM: Magnesium: 1.6 mg/dL — ABNORMAL LOW (ref 1.7–2.4)

## 2017-07-29 MED ORDER — MAGNESIUM SULFATE 4 GM/100ML IV SOLN
4.0000 g | Freq: Once | INTRAVENOUS | Status: AC
Start: 2017-07-29 — End: 2017-07-29
  Administered 2017-07-29: 11:00:00 4 g via INTRAVENOUS
  Filled 2017-07-29: qty 100

## 2017-07-29 NOTE — Progress Notes (Signed)
Pharmacy Electrolyte Monitoring Consult:  Pharmacy consulted to assist in monitoring and replacing electrolytes in this 52 y.o. male admitted on 07/18/2017 with Shortness of Breath and Alcohol Intoxication   Labs:  Sodium (mmol/L)  Date Value  07/28/2017 136  06/01/2014 136   Potassium (mmol/L)  Date Value  07/29/2017 3.6  06/01/2014 4.1   Magnesium (mg/dL)  Date Value  07/29/2017 1.6 (L)  11/23/2013 2.1   Phosphorus (mg/dL)  Date Value  07/24/2017 4.0   Calcium (mg/dL)  Date Value  07/28/2017 8.4 (L)   Calcium, Total (mg/dL)  Date Value  06/01/2014 8.5   Albumin (g/dL)  Date Value  07/26/2017 2.4 (L)  06/04/2014 2.6 (L)    Assessment/Plan: Mg remains subtherapeutic at 1.6.. Will order Mag sulfate 4g IV x 1 dose. No other replacement is needed at this time. Recheck electrolytes with AM labs.   Pernell Dupre, PharmD, BCPS Clinical Pharmacist 07/29/2017 10:04 AM

## 2017-07-29 NOTE — Progress Notes (Signed)
PASARR has been received, [id number] E expires on 08/28/2017.  [employer], LCSW [phone]

## 2017-07-29 NOTE — Care Management (Addendum)
RNCM notified by CSW that PT is recommending CIR. I have sent email to [facility].with CIR to review. I spoke with patient's mother by phone and explained that CSW is having difficulty with bed placement and that when patient is medically cleared she may need to take him home with PT services.  She said that she hopes that his rehab needs will be met before sending him home based on her age. CSW updated. She says she has a family member that works with [facility] and she is going to reach out to him to see if he can get patient into that faciliy.

## 2017-07-29 NOTE — Progress Notes (Signed)
Per [facility] admissions coordinator at [facility] they cannot offer a bed at this time.   [employer], LCSW [phone]

## 2017-07-29 NOTE — Progress Notes (Signed)
[facility] [place] NAME: George Arellano    MR#:  [id number]  DATE OF BIRTH:  01/02/1966  SUBJECTIVE:  CHIEF COMPLAINT:   Chief Complaint  Patient presents with  . Shortness of Breath  . Alcohol Intoxication   - Improving now. Complaints of weakness in legs and also blurred vision. Awaiting rehabilitation bed  REVIEW OF SYSTEMS:  Review of Systems  Constitutional: Negative for chills, fever and malaise/fatigue.  HENT: Negative for congestion, ear discharge, hearing loss and nosebleeds.   Eyes: Positive for blurred vision. Negative for double vision.  Respiratory: Positive for cough. Negative for shortness of breath and wheezing.   Cardiovascular: Negative for chest pain and palpitations.  Gastrointestinal: Negative for abdominal pain, constipation, diarrhea, nausea and vomiting.  Genitourinary: Negative for dysuria.  Musculoskeletal: Negative for myalgias.  Neurological: Negative for dizziness, sensory change, focal weakness, seizures and headaches.  Psychiatric/Behavioral: Negative for hallucinations.    DRUG ALLERGIES:   Allergies  Allergen Reactions  . Levaquin [Levofloxacin] Other (See Comments)    Reaction:  Unknown     VITALS:  Blood pressure 99/60, pulse 92, temperature 98.5 F (36.9 C), temperature source Oral, resp. rate 20, height 5\' 9"  (1.753 m), weight 71.7 kg (158 lb 1.6 oz), SpO2 99 %.  PHYSICAL EXAMINATION:  Physical Exam  GENERAL:  52 y.o.-year-old patient lying in the bed with no acute distress. disheleved appearing EYES: Pupils equal, round, reactive to light and accommodation. No scleral icterus. Extraocular muscles intact.  HEENT: Head atraumatic, normocephalic. Oropharynx and nasopharynx clear.  NECK:  Supple, no jugular venous distention. No thyroid enlargement, no tenderness.  LUNGS: Normal breath sounds bilaterally, no wheezing, rales,rhonchi or crepitation. No use of accessory muscles of respiration.  Decreased bibasilar breath sounds CARDIOVASCULAR: S1, S2 normal. No murmurs, rubs, or gallops.  ABDOMEN: Soft, nontender, nondistended. Bowel sounds present. No organomegaly or mass.  EXTREMITIES: No pedal edema, cyanosis, or clubbing.  NEUROLOGIC: Cranial nerves II through XII are intact. Decreased visual acuity Muscle strength 5/5 in all extremities. Trouble with coordination. Sensation intact. Gait not checked.  PSYCHIATRIC: The patient is alert and oriented x 3.  SKIN: No obvious rash, lesion, or ulcer.    LABORATORY PANEL:   CBC Recent Labs  Lab 07/29/17 0520  WBC 7.7  HGB 12.1*  HCT 34.6*  PLT 159   ------------------------------------------------------------------------------------------------------------------  Chemistries  Recent Labs  Lab 07/26/17 0644  07/28/17 0402 07/29/17 0520  NA 142   < > 136  --   K 3.2*   < > 3.3* 3.6  CL 103   < > 104  --   CO2 32   < > 24  --   GLUCOSE 108*   < > 95  --   BUN 31*   < > 20  --   CREATININE 1.18   < > 0.82  --   CALCIUM 8.4*   < > 8.4*  --   MG 1.5*   < > 1.5* 1.6*  AST 69*  --   --   --   ALT 81*  --   --   --   ALKPHOS 68  --   --   --   BILITOT 1.9*  --   --   --    < > = values in this interval not displayed.   ------------------------------------------------------------------------------------------------------------------  Cardiac Enzymes No results for input(s): TROPONINI in the last 168 hours. ------------------------------------------------------------------------------------------------------------------  RADIOLOGY:  No results found.  EKG:   Orders placed or performed during the hospital encounter of 07/18/17  . ED EKG 12-Lead  . ED EKG 12-Lead  . EKG 12-Lead  . EKG 12-Lead    ASSESSMENT AND PLAN:   52 year old male with known history of COPD on 2 L home oxygen, heavy alcohol use, smoking, hypertension brought into the hospital secondary to worsening shortness of breath and was noted to be  hypoxic and hypotensive.  1. Sub acute bilateral Cerebellar infarcts-could have happened on admission and patient presented with hypoxia. -MRI and MRA of the brain with Right PCA occlusion with large acute right PCA territory infarct. Patchy acute infarcts in the left PCA and bilateral cerebellar artery territories. Cytotoxic and gyral edema associated with stroke. No associated hemorrhage or significant intracranial mass effect.  -Carotid Dopplers done with no hemodynamically significant stenosis. -Appreciate neuro consult. Continue aspirin and statin -PT consult recommended rehabilitation -Explains his blurred vision from his stroke  2. Acute hypoxic and hypercarbic respiratory failure on admission-secondary to COPD exacerbation and bilateral pneumonia -Also presented with septic shock that has been resolved. Off pressors now -off steroids, finished  Zosyn for aspiration pneumonia.  -Discontinue BiPAP. Currently needing oxygen only at bedtime -Blood cultures have remained negative. - continue duonebs, add inhaler  3. Acute renal failure-ATN from sepsis and also rhabdomyolysis. -Improved after fluids.  4. Acute metabolic encephalopathy-secondary to alcohol withdrawal symptoms and stroke and sepsis on admission -Much improving and close to baseline now.  5. Hypokalemia and hypomagnesemia- being replaced and monitor.  6. DVT prophylaxis-on Lovenox   Updated mother at bedside Social worker looking for short-term rehabilitation    All the records are reviewed and case discussed with Care Management/Social Workerr. Management plans discussed with the patient, family and they are in agreement.  CODE STATUS: Full Code  TOTAL TIME TAKING CARE OF THIS PATIENT: 38 minutes.   POSSIBLE D/C IN 2-3 DAYS, DEPENDING ON CLINICAL CONDITION.   Gladstone Lighter M.D on 07/29/2017 at 3:17 PM  Between 7am to 6pm - Pager - [phone]  After 6pm go to [email or web address] - password [email or web address]  [facility]  Office  [phone]  CC: Primary care physician; Lavonne Chick, MD

## 2017-07-29 NOTE — Progress Notes (Signed)
I have been unable to reach pt by phone. I contacted his Mom by phone to discuss goals and expectations of an inpt rehab admit at [facility]. She and George Arellano live in [place]. She has a Industrial/product designer who works for [employer] and [employer] she is working today to see if they will reconsider and offer George Arellano a bed. If not there, she prefers inpt rehab at [facility] for that is closer to she and family. I have alerted RN CM, Levada Dy. [phone]

## 2017-07-29 NOTE — Progress Notes (Signed)
Physical Therapy Treatment Patient Details Name: George Arellano MRN: [id number] DOB: 1966-04-23 Today's Date: 07/29/2017    History of Present Illness 52yo male presented to ER secondary to AMS, worsening SOB; admitted with septic shock, severe sepsis related to PNA.  Initially requiring BiPAP, now weaned to 2L.  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      PT Comments    Pt has progressed since initial examination with original follow up recommendation as SNF.  Updated recommendation to CIR this session.  Pt continues to demonstrate LUE and LLE coordination and strength deficits and presents with L visual field deficits.  Pt requires assist for sit<>stand and min assist while ambulating due to unsteadiness related to weakness and ataxia.  Pt is very motivated to become more independent.  He ambulated 50 ft and then another 30 ft with seated rest break in between due to fatigue.  HR up to 137 while ambulating.  Therapeutic exercise this session focused on LUE and LLE coordination and strength as well as facilitating weight shift to LLE in standing and while ambulating.  Encouraged that with intensive therapy offered in the CIR setting that the pt will demonstrate gains toward greater independence.  Communicated to SW that if CIR is declined pt will need to go SNF at d/c due to risk of falling.     Follow Up Recommendations  CIR     Equipment Recommendations  Rolling walker with 5" wheels    Recommendations for Other Services       Precautions / Restrictions Precautions Precautions: Fall Restrictions Weight Bearing Restrictions: No    Mobility  Bed Mobility Overal bed mobility: Needs Assistance Bed Mobility: Supine to Sit     Supine to sit: Supervision;HOB elevated     General bed mobility comments: Increased time and effort, especially to elevate trunk.     Transfers Overall transfer level: Needs assistance Equipment used: Rolling walker (2 wheeled) Transfers: Sit to/from Stand Sit to Stand: Min assist         General transfer comment: Assist to boost to standing and physical assist for L hand placement on RW.  Cues for proper technique.  Pt unsteady while rising to stand.  Cues to reach back for armrest when preparing to sit.   Ambulation/Gait Ambulation/Gait assistance: Min assist Ambulation Distance (Feet): 80 Feet(50, 30) Assistive device: Rolling walker (2 wheeled) Gait Pattern/deviations: Step-through pattern;Staggering left;Staggering right;Ataxic;Decreased stance time - left;Decreased weight shift to left;Trunk flexed Gait velocity: decreased Gait velocity interpretation: Below normal speed for age/gender General Gait Details: RW adjusted to higher height with improved posture although pt still has tendency for flexed posture.  Cues to look at positioning of L foot when turning as pt has tendency to keep LLE outside of BOS when turning without realizing it.  Improved following visual scanning cues.  Verbal cues and facilitation to weight shift to LLE when ambulating.  Partial L knee buckle multiple occasions.  Pt requires occasional min assist to steady.  One seated rest break due to fatigue.  HR up to 137 while ambulating.    Stairs            Wheelchair Mobility    Modified Rankin (Stroke Patients Only)       Balance Overall balance assessment: Needs assistance Sitting-balance support: No upper extremity supported;Feet supported Sitting balance-Leahy Scale: Fair     Standing balance  support: Bilateral upper extremity supported;During functional activity Standing balance-Leahy Scale: Poor Standing balance comment: Relies on UE support for static and dynamic activities                            Cognition Arousal/Alertness: Awake/alert Behavior During Therapy: WFL for tasks  assessed/performed Overall Cognitive Status: Within Functional Limits for tasks assessed                                        Exercises Other Exercises Other Exercises: Seated LLE cordination exercise with moving target and cues for visual scanning Other Exercises: Seated LUE cordination exercise with moving target and cues for visual scanning Other Exercises: Seated LLE lightly manually resisted hip F x10 with fatigue onset quickly Other Exercises: Manually resisted L knee F and E in sitting x10 each direction Other Exercises: Lateral weight shifts in standing x10 each direction with verbal cues and tactile facilitation for near complete weight shift to LLE.     General Comments        Pertinent Vitals/Pain Pain Assessment: 0-10 Pain Score: 2  Pain Location: chest (pt reports due to coughing so much) Pain Descriptors / Indicators: Discomfort Pain Intervention(s): Limited activity within patient's tolerance;Monitored during session    Home Living                      Prior Function            PT Goals (current goals can now be found in the care plan section) Acute Rehab PT Goals Patient Stated Goal: get better so he can go home PT Goal Formulation: With patient Time For Goal Achievement: 08/09/17 Potential to Achieve Goals: Good Progress towards PT goals: Progressing toward goals    Frequency    7X/week      PT Plan Discharge plan needs to be updated    Co-evaluation              AM-PAC PT "6 Clicks" Daily Activity  Outcome Measure  Difficulty turning over in bed (including adjusting bedclothes, sheets and blankets)?: A Lot Difficulty moving from lying on back to sitting on the side of the bed? : A Lot Difficulty sitting down on and standing up from a chair with arms (e.g., wheelchair, bedside commode, etc,.)?: Unable Help needed moving to and from a bed to chair (including a wheelchair)?: A Lot Help needed walking in hospital  room?: A Little Help needed climbing 3-5 steps with a railing? : Total 6 Click Score: 11    End of Session Equipment Utilized During Treatment: Gait belt Activity Tolerance: Patient tolerated treatment well Patient left: in chair;with call bell/phone within reach;with chair alarm set Nurse Communication: Mobility status;Other (comment)(Heart monitor out of battery) PT Visit Diagnosis: Muscle weakness (generalized) (M62.81);Difficulty in walking, not elsewhere classified (R26.2)     Time: 7673-4193 PT Time Calculation (min) (ACUTE ONLY): 32 min  Charges:  $Gait Training: 8-22 mins $Therapeutic Exercise: 8-22 mins                    G Codes:       Collie Siad PT, DPT 07/29/2017, 11:01 AM

## 2017-07-29 NOTE — Progress Notes (Signed)
Occupational Therapy Treatment Patient Details Name: Harlie Ragle MRN: [id number] DOB: 12-26-1965 Today's Date: 07/29/2017    History of present illness Pt. is a 52 yo male presented to ER secondary to AMS, worsening SOB; admitted with septic shock, severe sepsis related to PNA.  Initially requiring BiPAP, now weaned to 2L.  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     OT comments  Pt. worked on visual scanning, visual search strategies for near space, and extrapersonal space in preparation for ADLs, and navigating within his environment during ADL tasks, and IADLs. Pt. Education was provided about visual compensatory strategies. Pt. continues to present with limited tracking to the Left, and crossing midline to the left. Pt. Needs continued reinforcement with this. Pt. Continues to benefit from OT services for ADL training, UE there. Ex, neuromuscular re-ed, and visual compensatory strategies.  Pt.  would continue to benefit from SNF level of care upon discharge.    Follow Up Recommendations  SNF    Equipment Recommendations  3 in 1 bedside commode    Recommendations for Other Services      Precautions / Restrictions         Mobility Bed Mobility Overal bed mobility: Needs Assistance Bed Mobility: Supine to Sit     Supine to sit: Supervision;HOB elevated Sit to supine: Supervision      Transfers Overall transfer level: Needs assistance Equipment used: Rolling walker (2 wheeled) Transfers: Sit to/from Stand Sit to Stand: Min assist              Balance                                           ADL either performed or assessed with clinical judgement   ADL Overall ADL's : Needs assistance/impaired Eating/Feeding: Set up;Sitting   Grooming: Sitting;Set up;Supervision/safety   Upper Body Bathing: Sitting;Minimal assistance    Lower Body Bathing: Sit to/from stand;Minimal assistance;Moderate assistance       Lower Body Dressing: Sit to/from stand;Moderate assistance;Minimal assistance                 General ADL Comments: Pt. edcuation was provided about visual scanning, and visual search strategies.      Vision Baseline Vision/History: No visual deficits Tracking/Visual Pursuits: Requires cues, head turns, or add eye shifts to track Visual Fields: Left visual field deficit   Perception     Praxis      Cognition Arousal/Alertness: Awake/alert Behavior During Therapy: WFL for tasks assessed/performed Overall Cognitive Status: Within Functional Limits for tasks assessed                                          Exercises     Shoulder Instructions       General Comments      Pertinent Vitals/ Pain       Pain Assessment: No/denies pain  Home Living                                          Prior Functioning/Environment  Frequency           Progress Toward Goals  OT Goals(current goals can now be found in the care plan section)  Progress towards OT goals: Progressing toward goals  Acute Rehab OT Goals Patient Stated Goal: get better so he can go home OT Goal Formulation: With patient Potential to Achieve Goals: Good  Plan      Co-evaluation                 AM-PAC PT "6 Clicks" Daily Activity     Outcome Measure   Help from another person eating meals?: A Little Help from another person taking care of personal grooming?: A Little Help from another person toileting, which includes using toliet, bedpan, or urinal?: A Little Help from another person bathing (including washing, rinsing, drying)?: A Lot Help from another person to put on and taking off regular upper body clothing?: A Lot Help from another person to put on and taking off regular lower body clothing?: A Lot 6 Click Score: 15    End of Session     OT Visit Diagnosis: Other abnormalities of gait and mobility (R26.89);Hemiplegia and hemiparesis;Other symptoms and signs involving cognitive function Hemiplegia - Right/Left: Left Hemiplegia - dominant/non-dominant: Dominant Hemiplegia - caused by: Cerebral infarction   Activity Tolerance Patient tolerated treatment well   Patient Left in bed;with call bell/phone within reach;with bed alarm set   Nurse Communication          Time: 6010-9323 OT Time Calculation (min): 20 min  Charges: OT General Charges $OT Visit: 1 Visit OT Treatments $Self Care/Home Management : 8-22 mins  Harrel Carina, MS, OTR/L    Harrel Carina, MS, OTR/L 07/29/2017, 4:45 PM

## 2017-07-29 NOTE — Progress Notes (Signed)
Neuro: Pt A&O X2-3/ Pt with slurred speech and left sided deficits. Neuro exam remains as previous shift. NIH score of 4. Will continue to monitor.    Respiratory:  Pt on RA with O2 sats WNL.  Cardiovascular: Pt remains in sinus rhythm with no ectopy at this time. BP continues to be WNL. Pt afebrile at this time.   GI/GU: Pt using urinal with adequate urine output at this time. Last BM today. Pt with good PO intake.    Skin: Skin intact with no s/s of skin breakdown at this time. Pt repositioning independently.   Pain: Pt with no reports of pain.    Events: NO acute events throughout shift. Pts plan of care to continue with current regimen, Family updated and no further questions at this time.

## 2017-07-29 NOTE — Progress Notes (Addendum)
Consult:  Discharge planning, placement   LCSW followed up with passar and bed status. Passar remains pending, however will send additional information.  Bed status: no current bed offers. Call placed to [facility] with [facility]. He will take a look and also reach out to [facility].  Awaiting review of referral and will follow up.  Plan: placement pending, awaiting offers for bed and completed passar.  Updated 3:37 PM [id number] E expires on 08/28/2017  (obtained)  Patient has one bed offer at [facility] (must stay 30 days in order to admit to bed for SNF) Family also discussing possible admission to CIR per note: If not there, she prefers inpt rehab at [facility] for that is closer to she and family.  Patient does have a bed available if willing to go for 30 days and distance as bed is located in [place], [place] (close to [place]).    Will follow up with family and medical stability regarding discharge plan.   Lane Hacker, MSW Clinical Social Work: [employer] Coverage for :  [phone]

## 2017-07-29 NOTE — Care Management (Signed)
Referral faxed to [facility] IR [phone].

## 2017-07-30 LAB — GLUCOSE, CAPILLARY
Glucose-Capillary: 94 mg/dL (ref 65–99)
Glucose-Capillary: 120 mg/dL — ABNORMAL HIGH (ref 65–99)
Glucose-Capillary: 97 mg/dL (ref 65–99)

## 2017-07-30 LAB — POTASSIUM: POTASSIUM: 3.3 mmol/L — AB (ref 3.5–5.1)

## 2017-07-30 LAB — MAGNESIUM: Magnesium: 1.8 mg/dL (ref 1.7–2.4)

## 2017-07-30 MED ORDER — NICOTINE 14 MG/24HR TD PT24
14.0000 mg | MEDICATED_PATCH | Freq: Every day | TRANSDERMAL | Status: DC
  Administered 2017-07-30 – 2017-08-01 (×3): 14 mg via TRANSDERMAL
  Filled 2017-07-30 (×3): qty 1

## 2017-07-30 MED ORDER — IPRATROPIUM-ALBUTEROL 0.5-2.5 (3) MG/3ML IN SOLN
3.0000 mL | Freq: Three times a day (TID) | RESPIRATORY_TRACT | Status: DC
  Administered 2017-07-30 – 2017-08-01 (×6): 3 mL via RESPIRATORY_TRACT
  Filled 2017-07-30 (×6): qty 3

## 2017-07-30 MED ORDER — POTASSIUM CHLORIDE CRYS ER 20 MEQ PO TBCR: 40.0000 meq | EXTENDED_RELEASE_TABLET | Freq: Once | ORAL | Status: DC

## 2017-07-30 MED ORDER — MAGNESIUM OXIDE 400 (241.3 MG) MG PO TABS
400.0000 mg | ORAL_TABLET | Freq: Every day | ORAL | Status: DC
  Administered 2017-07-30: 10:00:00 400 mg via ORAL
  Filled 2017-07-30: qty 1

## 2017-07-30 MED ORDER — POTASSIUM CHLORIDE CRYS ER 20 MEQ PO TBCR
40.0000 meq | EXTENDED_RELEASE_TABLET | Freq: Two times a day (BID) | ORAL | Status: DC
  Administered 2017-07-30 – 2017-08-01 (×4): 40 meq via ORAL
  Filled 2017-07-30 (×4): qty 2

## 2017-07-30 NOTE — Progress Notes (Signed)
[facility] [place] NAME: George Arellano    MR#:  [id number]  DATE OF BIRTH:  September 09, 1965  SUBJECTIVE:  CHIEF COMPLAINT:   Chief Complaint  Patient presents with  . Shortness of Breath  . Alcohol Intoxication   Has blurred vision and lower extremity weakness.  Working with physical therapy.  Not on oxygen.  Afebrile.  REVIEW OF SYSTEMS:  Review of Systems  Constitutional: Negative for chills, fever and malaise/fatigue.  HENT: Negative for congestion, ear discharge, hearing loss and nosebleeds.   Eyes: Positive for blurred vision. Negative for double vision.  Respiratory: Positive for cough. Negative for shortness of breath and wheezing.   Cardiovascular: Negative for chest pain and palpitations.  Gastrointestinal: Negative for abdominal pain, constipation, diarrhea, nausea and vomiting.  Genitourinary: Negative for dysuria.  Musculoskeletal: Negative for myalgias.  Neurological: Negative for dizziness, sensory change, focal weakness, seizures and headaches.  Psychiatric/Behavioral: Negative for hallucinations.    DRUG ALLERGIES:   Allergies  Allergen Reactions  . Levaquin [Levofloxacin] Other (See Comments)    Reaction:  Unknown     VITALS:  Blood pressure 94/65, pulse 88, temperature 98.3 F (36.8 C), temperature source Oral, resp. rate 19, height 5\' 9"  (1.753 m), weight 66.5 kg (146 lb 9 oz), SpO2 97 %.  PHYSICAL EXAMINATION:  Physical Exam  GENERAL:  52 y.o.-year-old patient lying in the bed with no acute distress. disheleved appearing EYES: Pupils equal, round, reactive to light and accommodation. No scleral icterus. Extraocular muscles intact.  HEENT: Head atraumatic, normocephalic. Oropharynx and nasopharynx clear.  NECK:  Supple, no jugular venous distention. No thyroid enlargement, no tenderness.  LUNGS: Normal breath sounds bilaterally, no wheezing, rales,rhonchi or crepitation. No use of accessory muscles of  respiration. Decreased bibasilar breath sounds CARDIOVASCULAR: S1, S2 normal. No murmurs, rubs, or gallops.  ABDOMEN: Soft, nontender, nondistended. Bowel sounds present. No organomegaly or mass.  EXTREMITIES: No pedal edema, cyanosis, or clubbing.  NEUROLOGIC: Cranial nerves II through XII are intact. Decreased visual acuity Muscle strength 5/5 in all extremities. Trouble with coordination. Sensation intact. Gait not checked.  PSYCHIATRIC: The patient is alert and awake SKIN: No obvious rash, lesion, or ulcer.    LABORATORY PANEL:   CBC Recent Labs  Lab 07/29/17 0520  WBC 7.7  HGB 12.1*  HCT 34.6*  PLT 159   ------------------------------------------------------------------------------------------------------------------  Chemistries  Recent Labs  Lab 07/26/17 0644  07/28/17 0402  07/30/17 0501  NA 142   < > 136  --   --   K 3.2*   < > 3.3*   < > 3.3*  CL 103   < > 104  --   --   CO2 32   < > 24  --   --   GLUCOSE 108*   < > 95  --   --   BUN 31*   < > 20  --   --   CREATININE 1.18   < > 0.82  --   --   CALCIUM 8.4*   < > 8.4*  --   --   MG 1.5*   < > 1.5*   < > 1.8  AST 69*  --   --   --   --   ALT 81*  --   --   --   --   ALKPHOS 68  --   --   --   --   BILITOT 1.9*  --   --   --   --    < > =  values in this interval not displayed.   ------------------------------------------------------------------------------------------------------------------  Cardiac Enzymes No results for input(s): TROPONINI in the last 168 hours. ------------------------------------------------------------------------------------------------------------------  RADIOLOGY:  No results found.  EKG:   Orders placed or performed during the hospital encounter of 07/18/17  . ED EKG 12-Lead  . ED EKG 12-Lead  . EKG 12-Lead  . EKG 12-Lead    ASSESSMENT AND PLAN:   52 year old male with known history of COPD on 2 L home oxygen, heavy alcohol use, smoking, hypertension brought into the  hospital secondary to worsening shortness of breath and was noted to be hypoxic and hypotensive.  1. Sub acute bilateral Cerebellar infarcts-could have happened on admission and patient presented with hypoxia. -MRI and MRA of the brain with Right PCA occlusion with large acute right PCA territory infarct. Patchy acute infarcts in the left PCA and bilateral cerebellar artery territories.  -Carotid Dopplers done with no hemodynamically significant stenosis.  Echocardiogram normal -Appreciate neuro consult. Continue aspirin and statin Needs skilled nursing facility  2. Acute hypoxic and hypercarbic respiratory failure on admission-secondary to COPD exacerbation and bilateral pneumonia -Also presented with septic shock that has been resolved. Off pressors now -off steroids, finished  Zosyn for aspiration pneumonia.  -Discontinued BiPAP. Currently needing oxygen only at bedtime -Blood cultures have remained negative. - continue duonebs, added inhaler  3. Acute renal failure-ATN from sepsis and also rhabdomyolysis. Resolved  4. Acute metabolic encephalopathy-secondary to alcohol withdrawal symptoms and stroke and sepsis on admission -Much improved and close to baseline now.  5. Hypokalemia and hypomagnesemia- being replaced and monitor. Add scheduled potassium and magnesium today.  6. DVT prophylaxis-on Lovenox   All the records are reviewed and case discussed with Care Management/Social Workerr. Management plans discussed with the patient, family and they are in agreement.  CODE STATUS: Full Code  TOTAL TIME TAKING CARE OF THIS PATIENT: 38 minutes.   POSSIBLE D/C IN 2-3 DAYS, DEPENDING ON CLINICAL CONDITION.   Neita Carp M.D on 07/30/2017 at 9:01 AM  Between 7am to 6pm - Pager - [phone]  After 6pm go to [email or web address] - password [email or web address] [facility]  Office  [phone]  CC: Primary care physician; Lavonne Chick, MD

## 2017-07-30 NOTE — Plan of Care (Signed)
  Progressing Education: Knowledge of General Education information will improve 07/30/2017 0450 - Progressing by Denice Bors, RN Health Behavior/Discharge Planning: Ability to manage health-related needs will improve 07/30/2017 0450 - Progressing by Denice Bors, RN Clinical Measurements: Ability to maintain clinical measurements within normal limits will improve 07/30/2017 0450 - Progressing by Denice Bors, RN Will remain free from infection 07/30/2017 0450 - Progressing by Denice Bors, RN Diagnostic test results will improve 07/30/2017 0450 - Progressing by Denice Bors, RN Respiratory complications will improve 07/30/2017 0450 - Progressing by Denice Bors, RN Cardiovascular complication will be avoided 07/30/2017 0450 - Progressing by Denice Bors, RN Activity: Risk for activity intolerance will decrease 07/30/2017 0450 - Progressing by Denice Bors, RN Nutrition: Adequate nutrition will be maintained 07/30/2017 0450 - Progressing by Denice Bors, RN Coping: Level of anxiety will decrease 07/30/2017 0450 - Progressing by Denice Bors, RN Elimination: Will not experience complications related to bowel motility 07/30/2017 0450 - Progressing by Denice Bors, RN Will not experience complications related to urinary retention 07/30/2017 0450 - Progressing by Denice Bors, RN Pain Managment: General experience of comfort will improve 07/30/2017 0450 - Progressing by Denice Bors, RN Safety: Ability to remain free from injury will improve 07/30/2017 0450 - Progressing by Denice Bors, RN Skin Integrity: Risk for impaired skin integrity will decrease 07/30/2017 0450 - Progressing by Denice Bors, RN Education: Knowledge of disease or condition will improve 07/30/2017 0450 - Progressing by Denice Bors, RN Knowledge of secondary prevention will improve 07/30/2017 0450 - Progressing by Denice Bors, RN Knowledge of patient  specific risk factors addressed and post discharge goals established will improve 07/30/2017 0450 - Progressing by Denice Bors, RN Self-Care: Ability to participate in self-care as condition permits will improve 07/30/2017 0450 - Progressing by Denice Bors, RN

## 2017-07-30 NOTE — Progress Notes (Signed)
Pharmacy Electrolyte Monitoring Consult:  Pharmacy consulted to assist in monitoring and replacing electrolytes in this 52 y.o. male admitted on 07/18/2017 with Shortness of Breath and Alcohol Intoxication   Labs:  Sodium (mmol/L)  Date Value  07/28/2017 136  06/01/2014 136   Potassium (mmol/L)  Date Value  07/30/2017 3.3 (L)  06/01/2014 4.1   Magnesium (mg/dL)  Date Value  07/30/2017 1.8  11/23/2013 2.1   Phosphorus (mg/dL)  Date Value  07/24/2017 4.0   Calcium (mg/dL)  Date Value  07/28/2017 8.4 (L)   Calcium, Total (mg/dL)  Date Value  06/01/2014 8.5   Albumin (g/dL)  Date Value  07/26/2017 2.4 (L)  06/04/2014 2.6 (L)    Assessment/Plan: Mg = 1.8 is WNL K = 3.3 is slightly low. Will give KCl 40 mEq PO once and recheck lytes with AM labs tomorrow.  Lenis Noon, PharmD, BCPS Clinical Pharmacist 07/30/2017 7:19 AM

## 2017-07-30 NOTE — Progress Notes (Signed)
Physical Therapy Treatment Patient Details Name: Chadwin Fury MRN: [id number] DOB: [birth year] Today's Date: 07/30/2017    History of Present Illness Pt. is a 52 yo male presented to ER secondary to AMS, worsening SOB; admitted with septic shock, severe sepsis related to PNA.  Initially requiring BiPAP, now weaned to 2L.  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      PT Comments    Pt agreeable to PT; reports "electrical shock" sensation down left arm particularly forearm; pt has questions on how to manage symptom. Advised pt to discuss with MD particularly neurologist. Pt demonstrating improved mobility with less overall assist required. Progressing ambulation distance. Initially ambulation very unsteady with heavy sway especially to the left. When corrected, pt notes he is purposefully trying to shift weight. Pt corrected with improvement in overall balance. Pt does need cues for Left lower extremity to remain in confines of rolling walker and directional cues repeated and at time physically assisted. Pt attempts correct sequence for STS transfers, but does require some assist for reach with left upper extremity. Pt participates seated lower extremity strengthening and encouraged to perform several times per day. Continue PT to progress strength, balance and endurance to improve all functional mobility.    Follow Up Recommendations  CIR     Equipment Recommendations  Rolling walker with 5" wheels    Recommendations for Other Services       Precautions / Restrictions Precautions Precautions: Fall Restrictions Weight Bearing Restrictions: No    Mobility  Bed Mobility               General bed mobility comments: Not tested; up in chair  Transfers Overall transfer level: Needs assistance Equipment used: Rolling walker (2 wheeled) Transfers: Sit to/from  Stand Sit to Stand: Min assist         General transfer comment: for steadiness; assist to find arm rests to sit   Ambulation/Gait Ambulation/Gait assistance: Min assist Ambulation Distance (Feet): 210 Feet Assistive device: Rolling walker (2 wheeled) Gait Pattern/deviations: Step-through pattern;Drifts right/left;Wide base of support(initial purposeful over sway causing decreased balance) Gait velocity: decreased Gait velocity interpretation: <1.8 ft/sec, indicative of risk for recurrent falls(1.18 ft/sec) General Gait Details: Pt initially states he is trying to shift weight more to L causing increased sway and LOB. Improved post correction    Stairs            Wheelchair Mobility    Modified Rankin (Stroke Patients Only)       Balance Overall balance assessment: Needs assistance Sitting-balance support: No upper extremity supported;Feet supported(improved with arm supported) Sitting balance-Leahy Scale: Fair     Standing balance support: Bilateral upper extremity supported Standing balance-Leahy Scale: Poor(Poor +)                              Cognition Arousal/Alertness: Awake/alert Behavior During Therapy: WFL for tasks assessed/performed Overall Cognitive Status: Within Functional Limits for tasks assessed                                        Exercises General Exercises - Lower Extremity Long Arc Quad: AROM;Strengthening;Both;15 reps;Seated Hip ABduction/ADduction: AAROM;Strengthening;Both;15 reps;Seated(single straight leg at a time) Straight Leg Raises: Strengthening;Both;10 reps;Seated Hip Flexion/Marching: AROM;Both;15 reps;Seated  Toe Raises: AROM;Both;15 reps;Seated Heel Raises: AROM;Both;15 reps;Seated    General Comments        Pertinent Vitals/Pain Pain Assessment: (describes an electrical type sensation down L arm)    Home Living                      Prior Function            PT Goals  (current goals can now be found in the care plan section) Progress towards PT goals: Progressing toward goals    Frequency    7X/week      PT Plan Current plan remains appropriate    Co-evaluation              AM-PAC PT "6 Clicks" Daily Activity  Outcome Measure  Difficulty turning over in bed (including adjusting bedclothes, sheets and blankets)?: A Little Difficulty moving from lying on back to sitting on the side of the bed? : A Little Difficulty sitting down on and standing up from a chair with arms (e.g., wheelchair, bedside commode, etc,.)?: Unable Help needed moving to and from a bed to chair (including a wheelchair)?: A Little Help needed walking in hospital room?: A Little Help needed climbing 3-5 steps with a railing? : A Lot 6 Click Score: 15    End of Session Equipment Utilized During Treatment: Gait belt Activity Tolerance: Patient tolerated treatment well Patient left: in chair;with call bell/phone within reach;with chair alarm set   PT Visit Diagnosis: Muscle weakness (generalized) (M62.81);Difficulty in walking, not elsewhere classified (R26.2)     Time: 0071-2197 PT Time Calculation (min) (ACUTE ONLY): 38 min  Charges:  $Gait Training: 8-22 mins $Therapeutic Exercise: 23-37 mins                    G Codes:        Larae Grooms, PTA 07/30/2017, 2:02 PM

## 2017-07-31 LAB — BASIC METABOLIC PANEL
Anion gap: 10 (ref 5–15)
BUN: 12 mg/dL (ref 6–20)
CHLORIDE: 99 mmol/L — AB (ref 101–111)
CO2: 22 mmol/L (ref 22–32)
Calcium: 8.8 mg/dL — ABNORMAL LOW (ref 8.9–10.3)
Creatinine, Ser: 0.87 mg/dL (ref 0.61–1.24)
GFR calc Af Amer: >60 mL/min (ref >60)
GFR calc non Af Amer: >60 mL/min (ref >60)
Glucose, Bld: 87 mg/dL (ref 65–99)
Potassium: 3.8 mmol/L (ref 3.5–5.1)
Sodium: 131 mmol/L — ABNORMAL LOW (ref 135–145)

## 2017-07-31 LAB — GLUCOSE, CAPILLARY
GLUCOSE-CAPILLARY: 84 mg/dL (ref 65–99)
GLUCOSE-CAPILLARY: 97 mg/dL (ref 65–99)
GLUCOSE-CAPILLARY: 118 mg/dL — AB (ref 65–99)
Glucose-Capillary: 79 mg/dL (ref 65–99)
Glucose-Capillary: 89 mg/dL (ref 65–99)

## 2017-07-31 LAB — MAGNESIUM: Magnesium: 1.6 mg/dL — ABNORMAL LOW (ref 1.7–2.4)

## 2017-07-31 MED ORDER — MAGNESIUM OXIDE 400 (241.3 MG) MG PO TABS
400.0000 mg | ORAL_TABLET | Freq: Two times a day (BID) | ORAL | Status: DC
  Administered 2017-07-31 – 2017-08-01 (×3): 400 mg via ORAL
  Filled 2017-07-31 (×3): qty 1

## 2017-07-31 MED ORDER — LORAZEPAM 2 MG/ML IJ SOLN
2.0000 mg | INTRAMUSCULAR | Status: DC | PRN
  Administered 2017-07-31: 04:00:00 2 mg via INTRAVENOUS
  Filled 2017-07-31: qty 1

## 2017-07-31 MED ORDER — MAGNESIUM SULFATE 2 GM/50ML IV SOLN
2.0000 g | Freq: Once | INTRAVENOUS | Status: AC
  Administered 2017-07-31: 2 g via INTRAVENOUS
  Filled 2017-07-31: qty 50

## 2017-07-31 MED ORDER — METOPROLOL TARTRATE 25 MG PO TABS: 12.5000 mg | ORAL_TABLET | Freq: Two times a day (BID) | ORAL | Status: DC

## 2017-07-31 MED ORDER — SODIUM CHLORIDE 0.9 % IV BOLUS (SEPSIS)
500.0000 mL | Freq: Once | INTRAVENOUS | Status: AC
  Administered 2017-07-31: 500 mL via INTRAVENOUS

## 2017-07-31 NOTE — Progress Notes (Signed)
Physical Therapy Treatment Patient Details Name: George Arellano MRN: [id number] DOB: 12-Nov-1965 Today's Date: 07/31/2017    History of Present Illness Pt. is a 52 yo male presented to ER secondary to AMS, worsening SOB; admitted with septic shock, severe sepsis related to PNA.  Initially requiring BiPAP, now weaned to 2L.  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      PT Comments    Patient is progressing slowly; He exhibits better trunk control being able to sit unsupported without UE support. However patient continues to veer to side when walking. He requires increased cues for walker placement and to stay inside RW for gait safety; Patient is very slow with ambulation often stopping and requiring cues to redirect to task. He was instructed in gait 200 feet x1 with increased left direction turns to challenge dynamic balance. Patient requires CGA for safety with cues for direction and walker safety; He reports increased fatigue after gait. Patient instructed in sitting coordination exercise to improve left side attention. He would benefit from additional skilled PT intervention to improve dynamic balance, strength and mobility; Current plan remains appropriate for skilled inpatient rehab;    Follow Up Recommendations  CIR     Equipment Recommendations  Rolling walker with 5" wheels    Recommendations for Other Services       Precautions / Restrictions Precautions Precautions: Fall Restrictions Weight Bearing Restrictions: No    Mobility  Bed Mobility               General bed mobility comments: Patient sitting up in bed at start of session; not observed;   Transfers Overall transfer level: Needs assistance Equipment used: Rolling walker (2 wheeled) Transfers: Sit to/from Stand Sit to Stand: Min guard         General transfer comment: for steadiness  with cues for hand placement for safety;   Ambulation/Gait Ambulation/Gait assistance: Min guard Ambulation Distance (Feet): 200 Feet Assistive device: Rolling walker (2 wheeled) Gait Pattern/deviations: Step-through pattern;Drifts right/left;Wide base of support;Decreased dorsiflexion - right;Decreased dorsiflexion - left;Decreased step length - right;Decreased step length - left;Trunk flexed;Shuffle Gait velocity: decreased   General Gait Details: decreased gait speed; exhibits veering to right side due to left inattention; Requires cues to improve RW positioning; instructed patient in left direction turns x4 to facilitate better dynamic balance challenge; He required CGA for safety and cues for walker positioning and foot position; very slow with gait often stopping to readjust and then starting again;    Stairs            Wheelchair Mobility    Modified Rankin (Stroke Patients Only)       Balance Overall balance assessment: Needs assistance Sitting-balance support: No upper extremity supported;Feet supported(improved with arm supported) Sitting balance-Leahy Scale: Fair     Standing balance support: Bilateral upper extremity supported Standing balance-Leahy Scale: Poor(Poor +)                              Cognition Arousal/Alertness: Awake/alert Behavior During Therapy: WFL for tasks assessed/performed Overall Cognitive Status: Within Functional Limits for tasks assessed                                        Exercises  Other Exercises Other Exercises: Instructed patient in seated LUE coordination exercise: thumb to each finger x5 reps with cues to slow down finger opposition for better accuracy; also instructed patient in pointing with LUE to object on left and then to nose x10 reps with cues for attention to object, improve accuracy of left finger placement and cues to redirect to task; patient often gets distracted and forgets what he is  supposed to do; utilized teachback for better exercise understanding;     General Comments        Pertinent Vitals/Pain Pain Assessment: No/denies pain    Home Living                      Prior Function            PT Goals (current goals can now be found in the care plan section) Acute Rehab PT Goals Patient Stated Goal: get better so he can go home PT Goal Formulation: With patient Time For Goal Achievement: 08/09/17 Potential to Achieve Goals: Good Progress towards PT goals: Progressing toward goals    Frequency    7X/week      PT Plan Current plan remains appropriate    Co-evaluation              AM-PAC PT "6 Clicks" Daily Activity  Outcome Measure  Difficulty turning over in bed (including adjusting bedclothes, sheets and blankets)?: A Little Difficulty moving from lying on back to sitting on the side of the bed? : A Little Difficulty sitting down on and standing up from a chair with arms (e.g., wheelchair, bedside commode, etc,.)?: Unable Help needed moving to and from a bed to chair (including a wheelchair)?: A Little Help needed walking in hospital room?: A Little Help needed climbing 3-5 steps with a railing? : A Lot 6 Click Score: 15    End of Session Equipment Utilized During Treatment: Gait belt Activity Tolerance: Patient tolerated treatment well Patient left: in chair;with call bell/phone within reach;with chair alarm set;with family/visitor present   PT Visit Diagnosis: Muscle weakness (generalized) (M62.81);Difficulty in walking, not elsewhere classified (R26.2)     Time: 8527-7824 PT Time Calculation (min) (ACUTE ONLY): 16 min  Charges:  $Gait Training: 8-22 mins                    G Codes:          Arellano,George PT, DPT 07/31/2017, 12:58 PM

## 2017-07-31 NOTE — Progress Notes (Signed)
Pharmacy Electrolyte Monitoring Consult:  Pharmacy consulted to assist in monitoring and replacing electrolytes in this 52 y.o. male admitted on 07/18/2017 with Shortness of Breath and Alcohol Intoxication   Labs:  Sodium (mmol/L)  Date Value  07/31/2017 131 (L)  06/01/2014 136   Potassium (mmol/L)  Date Value  07/31/2017 3.8  06/01/2014 4.1   Magnesium (mg/dL)  Date Value  07/31/2017 1.6 (L)  11/23/2013 2.1   Phosphorus (mg/dL)  Date Value  07/24/2017 4.0   Calcium (mg/dL)  Date Value  07/31/2017 8.8 (L)   Calcium, Total (mg/dL)  Date Value  06/01/2014 8.5   Albumin (g/dL)  Date Value  07/26/2017 2.4 (L)  06/04/2014 2.6 (L)    Assessment/Plan: Mg = 1.6. Magnesium 2 g IV daily + mag-ox 400 mg PO BID ordered K = 3.8 is WNL  Lenis Noon, PharmD, BCPS Clinical Pharmacist 07/31/2017 7:26 AM

## 2017-07-31 NOTE — Progress Notes (Signed)
[name] [place] NAME: George Arellano    MR#:  [id number]  DATE OF BIRTH:  31-Oct-1965  SUBJECTIVE:  CHIEF COMPLAINT:   Chief Complaint  Patient presents with  . Shortness of Breath  . Alcohol Intoxication   Has blurred vision and lower extremity weakness.  Working with physical therapy.  No pain Slept well  REVIEW OF SYSTEMS:  Review of Systems  Constitutional: Negative for chills, fever and malaise/fatigue.  HENT: Negative for congestion, ear discharge, hearing loss and nosebleeds.   Eyes: Positive for blurred vision. Negative for double vision.  Respiratory: Positive for cough. Negative for shortness of breath and wheezing.   Cardiovascular: Negative for chest pain and palpitations.  Gastrointestinal: Negative for abdominal pain, constipation, diarrhea, nausea and vomiting.  Genitourinary: Negative for dysuria.  Musculoskeletal: Negative for myalgias.  Neurological: Negative for dizziness, sensory change, focal weakness, seizures and headaches.  Psychiatric/Behavioral: Negative for hallucinations.    DRUG ALLERGIES:   Allergies  Allergen Reactions  . Levaquin [Levofloxacin] Other (See Comments)    Reaction:  Unknown     VITALS:  Blood pressure 101/69, pulse 88, temperature 98.1 F (36.7 C), temperature source Oral, resp. rate 18, height 5\' 9"  (1.753 m), weight 67.8 kg (149 lb 8 oz), SpO2 96 %.  PHYSICAL EXAMINATION:  Physical Exam  GENERAL:  52 y.o.-year-old patient lying in the bed with no acute distress. disheleved appearing EYES: Pupils equal, round, reactive to light and accommodation. No scleral icterus. Extraocular muscles intact.  HEENT: Head atraumatic, normocephalic. Oropharynx and nasopharynx clear.  NECK:  Supple, no jugular venous distention. No thyroid enlargement, no tenderness.  LUNGS: Normal breath sounds bilaterally, no wheezing, rales,rhonchi or crepitation. No use of accessory muscles of respiration.  Decreased bibasilar breath sounds CARDIOVASCULAR: S1, S2 normal. No murmurs, rubs, or gallops.  ABDOMEN: Soft, nontender, nondistended. Bowel sounds present. No organomegaly or mass.  EXTREMITIES: No pedal edema, cyanosis, or clubbing.  NEUROLOGIC: Cranial nerves II through XII are intact. Decreased visual acuity Muscle strength 5/5 in all extremities. Trouble with coordination. Sensation intact. Gait not checked.  PSYCHIATRIC: The patient is alert and awake SKIN: No obvious rash, lesion, or ulcer.    LABORATORY PANEL:   CBC Recent Labs  Lab 07/29/17 0520  WBC 7.7  HGB 12.1*  HCT 34.6*  PLT 159   ------------------------------------------------------------------------------------------------------------------  Chemistries  Recent Labs  Lab 07/26/17 0644  07/31/17 0453  NA 142   < > 131*  K 3.2*   < > 3.8  CL 103   < > 99*  CO2 32   < > 22  GLUCOSE 108*   < > 87  BUN 31*   < > 12  CREATININE 1.18   < > 0.87  CALCIUM 8.4*   < > 8.8*  MG 1.5*   < > 1.6*  AST 69*  --   --   ALT 81*  --   --   ALKPHOS 68  --   --   BILITOT 1.9*  --   --    < > = values in this interval not displayed.   ------------------------------------------------------------------------------------------------------------------  Cardiac Enzymes No results for input(s): TROPONINI in the last 168 hours. ------------------------------------------------------------------------------------------------------------------  RADIOLOGY:  No results found.  EKG:   Orders placed or performed during the hospital encounter of 07/18/17  . ED EKG 12-Lead  . ED EKG 12-Lead  . EKG 12-Lead  . EKG 12-Lead    ASSESSMENT AND PLAN:  52 year old male with known history of COPD on 2 L home oxygen, heavy alcohol use, smoking, hypertension brought into the hospital secondary to worsening shortness of breath and was noted to be hypoxic and hypotensive.  1. Sub acute bilateral Cerebellar infarcts-could have happened  on admission and patient presented with hypoxia. -MRI and MRA of the brain with Right PCA occlusion with large acute right PCA territory infarct. Patchy acute infarcts in the left PCA and bilateral cerebellar artery territories.  -Carotid Dopplers done with no hemodynamically significant stenosis.  Echocardiogram normal -Appreciate neuro consult. Continue aspirin and statin Needs skilled nursing facility at discharge Jud pending  2. Acute hypoxic and hypercarbic respiratory failure on admission-secondary to COPD exacerbation and bilateral pneumonia -Also presented with septic shock that has been resolved. Off pressors now -off steroids, finished  Zosyn for aspiration pneumonia.  -Discontinued BiPAP. Currently needing oxygen only at bedtime -Blood cultures have remained negative. - continue duonebs, added inhaler  3. Acute renal failure-ATN from sepsis and also rhabdomyolysis. Resolved  4. Acute metabolic encephalopathy-secondary to alcohol withdrawal symptoms and stroke and sepsis on admission -Much improved and close to baseline now.  5. Hypokalemia and hypomagnesemia- being replaced and monitor. Added scheduled potassium and magnesium .  6. DVT prophylaxis-on Lovenox   All the records are reviewed and case discussed with Care Management/Social Workerr. Management plans discussed with the patient, family and they are in agreement.  CODE STATUS: Full Code  TOTAL TIME TAKING CARE OF THIS PATIENT: 30 minutes.   POSSIBLE D/C IN 2-3 DAYS, DEPENDING ON CLINICAL CONDITION.   Leia Alf Meloney Feld M.D on 07/31/2017 at 12:00 PM  Between 7am to 6pm - Pager - [phone]  After 6pm go to [email or web address] - password [email or web address] [facility]  Office  [phone]  CC: Primary care physician; Lavonne Chick, MD

## 2017-08-01 LAB — CBC
HEMATOCRIT: 28 % — AB (ref 40.0–52.0)
HEMOGLOBIN: 9.8 g/dL — AB (ref 13.0–18.0)
MCH: 34.8 pg — ABNORMAL HIGH (ref 26.0–34.0)
MCHC: 34.9 g/dL (ref 32.0–36.0)
MCV: 99.8 fL (ref 80.0–100.0)
Platelets: 232 10*3/uL (ref 150–440)
RBC: 2.8 MIL/uL — AB (ref 4.40–5.90)
RDW: 12.3 % (ref 11.5–14.5)
WBC: 5.9 10*3/uL (ref 3.8–10.6)

## 2017-08-01 LAB — POTASSIUM: Potassium: 3.7 mmol/L (ref 3.5–5.1)

## 2017-08-01 LAB — GLUCOSE, CAPILLARY
GLUCOSE-CAPILLARY: 95 mg/dL (ref 65–99)
Glucose-Capillary: 88 mg/dL (ref 65–99)

## 2017-08-01 LAB — MAGNESIUM: Magnesium: 1.5 mg/dL — ABNORMAL LOW (ref 1.7–2.4)

## 2017-08-01 MED ORDER — FOLIC ACID 1 MG PO TABS: 1.0000 mg | ORAL_TABLET | Freq: Every day | ORAL | 0 refills | Status: DC

## 2017-08-01 MED ORDER — NICOTINE 14 MG/24HR TD PT24: 14.0000 mg | MEDICATED_PATCH | Freq: Every day | TRANSDERMAL | 0 refills | Status: DC

## 2017-08-01 MED ORDER — MAGNESIUM SULFATE 2 GM/50ML IV SOLN
2.0000 g | Freq: Once | INTRAVENOUS | Status: AC
  Administered 2017-08-01: 09:00:00 2 g via INTRAVENOUS
  Filled 2017-08-01: qty 50

## 2017-08-01 MED ORDER — ASPIRIN 325 MG PO TABS: 325.0000 mg | ORAL_TABLET | Freq: Every day | ORAL | 0 refills | Status: DC

## 2017-08-01 MED ORDER — ATORVASTATIN CALCIUM 40 MG PO TABS: 40.0000 mg | ORAL_TABLET | Freq: Every day | ORAL | 0 refills | Status: DC

## 2017-08-01 MED ORDER — THIAMINE HCL 100 MG PO TABS: 100.0000 mg | ORAL_TABLET | Freq: Every day | ORAL | 0 refills | Status: DC

## 2017-08-01 NOTE — Care Management (Addendum)
George Arellano has decided that he would like to go home, States he already receives home oxygen from [facility]. Would like to get physical therapy from them too. States he has a rolling walker and a hover round at home that was his sister's he can use. Discharge to home today per Dr. Manuella Ghazi. Shelbie Ammons RN MSN CCM Care Management [phone]

## 2017-08-01 NOTE — Plan of Care (Signed)
  Progressing Education: Knowledge of General Education information will improve 08/01/2017 0426 - Progressing by Sonda Primes, RN Health Behavior/Discharge Planning: Ability to manage health-related needs will improve 08/01/2017 0426 - Progressing by Sonda Primes, RN Clinical Measurements: Ability to maintain clinical measurements within normal limits will improve 08/01/2017 0426 - Progressing by Sonda Primes, RN Will remain free from infection 08/01/2017 0426 - Progressing by Sonda Primes, RN Diagnostic test results will improve 08/01/2017 0426 - Progressing by Sonda Primes, RN Respiratory complications will improve 08/01/2017 0426 - Progressing by Sonda Primes, RN Cardiovascular complication will be avoided 08/01/2017 0426 - Progressing by Sonda Primes, RN Activity: Risk for activity intolerance will decrease 08/01/2017 0426 - Progressing by Sonda Primes, RN Nutrition: Adequate nutrition will be maintained 08/01/2017 0426 - Progressing by Sonda Primes, RN Coping: Level of anxiety will decrease 08/01/2017 0426 - Progressing by Sonda Primes, RN Elimination: Will not experience complications related to bowel motility 08/01/2017 0426 - Progressing by Sonda Primes, RN Will not experience complications related to urinary retention 08/01/2017 0426 - Progressing by Sonda Primes, RN Pain Managment: General experience of comfort will improve 08/01/2017 0426 - Progressing by Sonda Primes, RN Safety: Ability to remain free from injury will improve 08/01/2017 0426 - Progressing by Sonda Primes, RN Skin Integrity: Risk for impaired skin integrity will decrease 08/01/2017 0426 - Progressing by Sonda Primes, RN Education: Knowledge of disease or condition will improve 08/01/2017 0426 - Progressing by Sonda Primes, RN Knowledge of secondary prevention will improve 08/01/2017 0426 - Progressing by Sonda Primes, RN Knowledge of patient specific risk factors addressed and post discharge goals established will improve 08/01/2017 0426 - Progressing by Sonda Primes, RN Self-Care: Ability to participate in self-care as condition permits will improve 08/01/2017 0426 - Progressing by Sonda Primes, RN

## 2017-08-01 NOTE — Progress Notes (Signed)
Discussed discharge instructions and medications with patient and his mother. IV removed. All questions addressed. Patient transported home via car by his mother.

## 2017-08-01 NOTE — Progress Notes (Addendum)
Clinical Education officer, museum (CSW) discussed D/C plan with patient and made him aware that [facility] in [place] is the only facility that has made a bed offer. CSW made patient aware that he would have to stay at [facility] for 30 days and sign over his SSI check. At first patient agreed to go to [facility].  Patient changed his mind and stated that he is going home today. Patient walked 200 feet with PT yesterday and is stable to go home. RN case manager aware of above. Please reconsult if future social work needs arise. CSW signing off.   CSW received a call from patient's mother [name] stating that her son is telling her he is being discharged today. CSW made Katie aware of above. Per [name] she will come pick patient up today and take him to her house around 12 noon.   [employer], LCSW [phone]

## 2017-08-04 NOTE — Discharge Summary (Signed)
[facility] [place] NAME: George Arellano    MR#:  [id number]  DATE OF BIRTH:  May 29, 1966  DATE OF ADMISSION:  07/18/2017   ADMITTING PHYSICIAN: Epifanio Lesches, MD  DATE OF DISCHARGE: 08/01/2017 12:22 PM  PRIMARY CARE PHYSICIAN: Lavonne Chick, MD   ADMISSION DIAGNOSIS:  Hypoxia [R09.02] Sepsis, due to unspecified organism [facility]) [A41.9] Aspiration pneumonia of left lung, unspecified aspiration pneumonia type, unspecified part of lung ([place]) [J69.0] Community acquired pneumonia of left lung, unspecified part of lung [J18.9] DISCHARGE DIAGNOSIS:  Active Problems:   Acute respiratory failure with hypoxemia ([place])  SECONDARY DIAGNOSIS:   Past Medical History:  Diagnosis Date  . Anxiety   . CHF (congestive heart failure) ([place])   . COPD (chronic obstructive pulmonary disease) ([place])   . Hypertension    HOSPITAL COURSE:  52 year old male with known history of COPD on 2 L home oxygen, heavy alcohol use, smoking, hypertension brought into the hospital secondary to worsening shortness of breath and was noted to be hypoxic and hypotensive.  1. Sub acute bilateral Cerebellar infarcts-could have happened on admission and patient presented with hypoxia. -MRI and MRA of the brain with Right PCA occlusion with large acute right PCA territory infarct. Patchy acute infarcts in the left PCA and bilateral cerebellar artery territories.  -Carotid Dopplers done with no hemodynamically significant stenosis.  Echocardiogram normal - neuro recommends aspirin and statin  2. Acute hypoxic and hypercarbic respiratory failure on admission-secondary to COPD exacerbation and bilateral pneumonia -Also presented with septic shock that has been resolved.   3. Acute renal failure-ATN from sepsis and also rhabdomyolysis. Resolved  4. Acute metabolic encephalopathy-secondary to alcohol withdrawal symptoms and stroke and sepsis on admission -resolved and  close to baseline now.  5. Hypokalemia and hypomagnesemia- replaced DISCHARGE CONDITIONS:  stable CONSULTS OBTAINED:  Treatment Team:  Leotis Pain, MD DRUG ALLERGIES:   Allergies  Allergen Reactions  . Levaquin [Levofloxacin] Other (See Comments)    Reaction:  Unknown    DISCHARGE MEDICATIONS:   Allergies as of 08/01/2017      Reactions   Levaquin [levofloxacin] Other (See Comments)   Reaction:  Unknown       Medication List    STOP taking these medications   guaiFENesin 100 MG/5ML Soln Commonly known as:  ROBITUSSIN   lisinopril 2.5 MG tablet Commonly known as:  PRINIVIL,ZESTRIL   metoprolol tartrate 50 MG tablet Commonly known as:  LOPRESSOR   oxyCODONE 5 MG immediate release tablet Commonly known as:  Oxy IR/ROXICODONE   predniSONE 10 MG (21) Tbpk tablet Commonly known as:  STERAPRED UNI-PAK 21 TAB   sucralfate 1 g tablet Commonly known as:  CARAFATE     TAKE these medications   albuterol 108 (90 Base) MCG/ACT inhaler Commonly known as:  PROVENTIL HFA;VENTOLIN HFA Inhale 2 puffs into the lungs every 6 (six) hours as needed for wheezing or shortness of breath.   aspirin 325 MG tablet Take 1 tablet (325 mg total) by mouth daily.   atorvastatin 40 MG tablet Commonly known as:  LIPITOR Take 1 tablet (40 mg total) by mouth daily at 6 PM.   budesonide-formoterol 160-4.5 MCG/ACT inhaler Commonly known as:  SYMBICORT Inhale 2 puffs into the lungs 2 (two) times daily.   COMBIVENT RESPIMAT 20-100 MCG/ACT Aers respimat Generic drug:  Ipratropium-Albuterol Inhale 2 puffs into the lungs every 6 (six) hours as needed for wheezing or shortness of breath.   ipratropium-albuterol 0.5-2.5 (3) MG/3ML  Soln Commonly known as:  DUONEB Take 3 mLs by nebulization every 6 (six) hours as needed (for shortness of breath/wheezing).   dicyclomine 20 MG tablet Commonly known as:  BENTYL Take 1 tablet (20 mg total) by mouth 3 (three) times daily as needed (abdominal  pain).   folic acid 1 MG tablet Commonly known as:  FOLVITE Take 1 tablet (1 mg total) by mouth daily.   nicotine 14 mg/24hr patch Commonly known as:  NICODERM CQ - dosed in mg/24 hours Place 1 patch (14 mg total) onto the skin daily.   thiamine 100 MG tablet Take 1 tablet (100 mg total) by mouth daily.        DISCHARGE INSTRUCTIONS:   DIET:  Regular diet DISCHARGE CONDITION:  Good ACTIVITY:  Activity as tolerated OXYGEN:  Home Oxygen: No.  Oxygen Delivery: room air DISCHARGE LOCATION:  home   If you experience worsening of your admission symptoms, develop shortness of breath, life threatening emergency, suicidal or homicidal thoughts you must seek medical attention immediately by calling 911 or calling your MD immediately  if symptoms less severe.  You Must read complete instructions/literature along with all the possible adverse reactions/side effects for all the Medicines you take and that have been prescribed to you. Take any new Medicines after you have completely understood and accpet all the possible adverse reactions/side effects.   Please note  You were cared for by a hospitalist during your hospital stay. If you have any questions about your discharge medications or the care you received while you were in the hospital after you are discharged, you can call the unit and asked to speak with the hospitalist on call if the hospitalist that took care of you is not available. Once you are discharged, your primary care physician will handle any further medical issues. Please note that NO REFILLS for any discharge medications will be authorized once you are discharged, as it is imperative that you return to your primary care physician (or establish a relationship with a primary care physician if you do not have one) for your aftercare needs so that they can reassess your need for medications and monitor your lab values.    On the day of Discharge:  VITAL SIGNS:  Blood  pressure (!) 91/57, pulse (!) 102, temperature 99.9 F (37.7 C), temperature source Oral, resp. rate 18, height 5\' 9"  (1.753 m), weight 68.2 kg (150 lb 6.4 oz), SpO2 97 %. PHYSICAL EXAMINATION:  GENERAL:  52 y.o.-year-old patient lying in the bed with no acute distress.  EYES: Pupils equal, round, reactive to light and accommodation. No scleral icterus. Extraocular muscles intact.  HEENT: Head atraumatic, normocephalic. Oropharynx and nasopharynx clear.  NECK:  Supple, no jugular venous distention. No thyroid enlargement, no tenderness.  LUNGS: Normal breath sounds bilaterally, no wheezing, rales,rhonchi or crepitation. No use of accessory muscles of respiration.  CARDIOVASCULAR: S1, S2 normal. No murmurs, rubs, or gallops.  ABDOMEN: Soft, non-tender, non-distended. Bowel sounds present. No organomegaly or mass.  EXTREMITIES: No pedal edema, cyanosis, or clubbing.  NEUROLOGIC: Cranial nerves II through XII are intact. Muscle strength 5/5 in all extremities. Sensation intact. Gait not checked.  PSYCHIATRIC: The patient is alert and oriented x 3.  SKIN: No obvious rash, lesion, or ulcer.  DATA REVIEW:   CBC Recent Labs  Lab 08/01/17 0422  WBC 5.9  HGB 9.8*  HCT 28.0*  PLT 232    Chemistries  Recent Labs  Lab 07/31/17 0453 08/01/17 0422  NA 131*  --  K 3.8 3.7  CL 99*  --   CO2 22  --   GLUCOSE 87  --   BUN 12  --   CREATININE 0.87  --   CALCIUM 8.8*  --   MG 1.6* 1.5*     Follow-up Information    Lavonne Chick, MD. Schedule an appointment as soon as possible for a visit in 1 week.   Specialty:  Family Medicine Why:  Office Closed  Contact information: [place] [address] [phone]        Vladimir Crofts, MD. Go on 08/15/2017.   Specialty:  Neurology Why:  @2 :15 PM Contact information: [place] [facility] [place] [address] [phone]           Management plans discussed with the patient,  family and they are in agreement.  CODE STATUS: Prior   TOTAL TIME TAKING CARE OF THIS PATIENT: 45 minutes.    Max Sane M.D on 08/04/2017 at 6:23 AM  Between 7am to 6pm - Pager - [phone]  After 6pm go to [email or web address] - [employer] [facility]  Office  [phone]  CC: Primary care physician; Lavonne Chick, MD   Note: This dictation was prepared with Dragon dictation along with smaller phrase technology. Any transcriptional errors that result from this process are unintentional.

## 2017-08-15 NOTE — Progress Notes (Signed)
* [facility]     Assessment and Plan:  COPD, with recent exacerbation requiring hospitalization. - Recent pneumonia appears to have resolved, patient is now feeling better. -Continue Combivent, Symbicort, nebulizer, albuterol metered-dose inhaler. - Continue to advance activity as tolerated.  Underlying alcoholic cirrhosis, alcohol abuse.  - Currently in remission.  CVA with unstable gait, deconditioning, weakness.  -Currently undergoing rehab, encouraged to continue.  Excessive daytime sleepiness.  - Symptoms and signs of obstructive sleep apnea. - Dr. Manuella Ghazi who is his neurologist is ordering a sleep study for the patient.  Nicotine abuse.  -Discussed the importance of smoke cessation, including setting a quit date, spent 3 minutes in discussion.  Orders Placed This Encounter  Procedures  . Pulmonary Function Test [facility] Only   Return in about 3 months (around 11/13/2017).   Date: 08/16/2017  MRN# [id number] George Arellano July 21, 1965   George Arellano is a 52 y.o. old male seen in follow up for chief complaint of  Chief Complaint  Patient presents with  . Hospitalization Follow-up    Pt uses neb x 2 daily, inhalers daily.  Marland Kitchen COPD    02 2L close 24 hrs.     HPI:   The patient is a 52 year old male, admitted to the hospital in January 2019 with COPD exacerbation/pneumonia, his course was complicated by stroke. He is present with his mother who provides some of the history.  Since getting out of the hospital he has been feeling nearly back to normal.  He does have sinus issues with drainage, he does not take a nasal spray, he is supposed to use oxygen all the times at 2L and at night, he uses most of the times at night.  He is sleepy during the day and "can nap at any time". He snores loudly and stops breathing. He saw his neurologist, Dr. Manuella Ghazi who is going to set him up for a sleep study.  He smokes less than a ppd, he has patches ordered he is planning on  starting them, he and his mother live together and are planning to quit together, his mother also has COPD and is on oxygen.   He is using combivent respimat 2 puffs bid, symbicort 2 puffs bid. He has proair usually twice per day. He has a nebulizer bid. He feels that they are helping., the neb particularly.     Desat walk at rest on RA 98% and HR 93; walked 924 feet at uncertain gait, conversational, minimal dyspnea, sat was 94% and HR 98.    Medication:    Current Outpatient Medications:  .  albuterol (PROVENTIL HFA;VENTOLIN HFA) 108 (90 BASE) MCG/ACT inhaler, Inhale 2 puffs into the lungs every 6 (six) hours as needed for wheezing or shortness of breath., Disp: , Rfl:  .  aspirin 325 MG tablet, Take 1 tablet (325 mg total) by mouth daily., Disp: 30 tablet, Rfl: 0 .  atorvastatin (LIPITOR) 40 MG tablet, Take 1 tablet (40 mg total) by mouth daily at 6 PM., Disp: 30 tablet, Rfl: 0 .  budesonide-formoterol (SYMBICORT) 160-4.5 MCG/ACT inhaler, Inhale 2 puffs into the lungs 2 (two) times daily., Disp: , Rfl:  .  dicyclomine (BENTYL) 20 MG tablet, Take 1 tablet (20 mg total) by mouth 3 (three) times daily as needed (abdominal pain)., Disp: 30 tablet, Rfl: 0 .  folic acid (FOLVITE) 1 MG tablet, Take 1 tablet (1 mg total) by mouth daily., Disp: 30 tablet, Rfl: 0 .  gabapentin (NEURONTIN) 300 MG capsule,  Take 300 mg by mouth at bedtime., Disp: , Rfl:  .  Ipratropium-Albuterol (COMBIVENT RESPIMAT) 20-100 MCG/ACT AERS respimat, Inhale 2 puffs into the lungs every 6 (six) hours as needed for wheezing or shortness of breath., Disp: , Rfl:  .  ipratropium-albuterol (DUONEB) 0.5-2.5 (3) MG/3ML SOLN, Take 3 mLs by nebulization every 6 (six) hours as needed (for shortness of breath/wheezing)., Disp: , Rfl:  .  Magnesium 500 MG CAPS, Take 1 capsule by mouth daily., Disp: , Rfl:  .  nicotine (NICODERM CQ - DOSED IN MG/24 HOURS) 14 mg/24hr patch, Place 1 patch (14 mg total) onto the skin daily., Disp: 28 patch,  Rfl: 0 .  thiamine 100 MG tablet, Take 1 tablet (100 mg total) by mouth daily., Disp: 30 tablet, Rfl: 0 .  vitamin B-12 (CYANOCOBALAMIN) 1000 MCG tablet, Take 1,000 mcg by mouth daily., Disp: , Rfl:    Allergies:  Levaquin [levofloxacin]  Review of Systems: Gen:  Denies  fever, sweats. HEENT: Denies blurred vision. Cvc:  No dizziness, chest pain or heaviness Resp:   Denies cough or sputum porduction. Gi: Denies swallowing difficulty, stomach pain. constipation, bowel incontinence Gu:  Denies bladder incontinence, burning urine Ext:   No Joint pain, stiffness. Skin: No skin rash, easy bruising. Endoc:  No polyuria, polydipsia. Psych: No depression, insomnia. Other:  All other systems were reviewed and found to be negative other than what is mentioned in the HPI.   Physical Examination:   VS: BP 124/78 (BP Location: Left Arm, Cuff Size: Normal)   Pulse 99   Resp 16   Ht 5\' 9"  (1.753 m)   Wt 156 lb (70.8 kg)   SpO2 100%   BMI 23.04 kg/m    General Appearance: No distress  Neuro:without focal findings,  speech normal,  HEENT: PERRLA, EOM intact. Pulmonary: normal breath sounds, No wheezing.  Decreased air entry bilaterally. CardiovascularNormal S1,S2.  No m/r/g.   Abdomen: Benign, Soft, non-tender. Renal:  No costovertebral tenderness  GU:  Not performed at this time. Endoc: No evident thyromegaly, no signs of acromegaly. Skin:   warm, no rash. Extremities: normal, no cyanosis, clubbing.   LABORATORY PANEL:   CBC No results for input(s): WBC, HGB, HCT, PLT in the last 168 hours. ------------------------------------------------------------------------------------------------------------------  Chemistries  No results for input(s): NA, K, CL, CO2, GLUCOSE, BUN, CREATININE, CALCIUM, MG, AST, ALT, ALKPHOS, BILITOT in the last 168 hours.  Invalid input(s):  GFRCGP ------------------------------------------------------------------------------------------------------------------  Cardiac Enzymes No results for input(s): TROPONINI in the last 168 hours. ------------------------------------------------------------  RADIOLOGY:   No results found for this or any previous visit. Results for orders placed during the hospital encounter of 07/18/17  DG Chest 2 View   Narrative CLINICAL DATA:  Pneumonia  EXAM: CHEST  2 VIEW  COMPARISON:  07/26/2017  FINDINGS: Bibasilar airspace unchanged. Left upper lobe infiltrate unchanged. Small bilateral pleural effusions unchanged. Negative for heart failure.  IMPRESSION: Bilateral airspace disease unchanged, probable pneumonia   Electronically Signed   By: Franchot Gallo M.D.   On: 07/27/2017 09:51    ------------------------------------------------------------------------------------------------------------------  Thank  you for allowing [facility] to assist in the care of your patient. Our recommendations are noted above.  Please contact us if we can be of further service.   Marda Stalker, MD.  [facility] Office Number: [phone]  Patricia Pesa, M.D.  Merton Border, M.D  08/16/2017

## 2017-08-16 ENCOUNTER — Ambulatory Visit (INDEPENDENT_AMBULATORY_CARE_PROVIDER_SITE_OTHER): Payer: Medicaid Other | Admitting: Internal Medicine

## 2017-08-16 ENCOUNTER — Encounter: Payer: Self-pay | Admitting: Internal Medicine

## 2017-08-16 VITALS — BP 124/78 | HR 99 | Resp 16 | Ht 69.0 in | Wt 156.0 lb

## 2017-08-16 DIAGNOSIS — F17210 Nicotine dependence, cigarettes, uncomplicated: Secondary | ICD-10-CM | POA: Diagnosis not present

## 2017-08-16 DIAGNOSIS — J9601 Acute respiratory failure with hypoxia: Secondary | ICD-10-CM | POA: Diagnosis not present

## 2017-08-16 DIAGNOSIS — J449 Chronic obstructive pulmonary disease, unspecified: Secondary | ICD-10-CM

## 2017-08-16 DIAGNOSIS — G4719 Other hypersomnia: Secondary | ICD-10-CM | POA: Diagnosis not present

## 2017-08-16 DIAGNOSIS — Z720 Tobacco use: Secondary | ICD-10-CM

## 2017-08-16 NOTE — Patient Instructions (Addendum)
Use oxygen with sleeping, you do not need to use oxygen during the day as long as your oxygen saturation is 90% or better.  Your neurologist is sending you for a sleep study, if this is not ordered in the next week, let us know and we will can set this up for you.     --Quitting smoking is the most important thing that you can do for your health.  --Quitting smoking will have greater affect on your health than any medicine that we can give you.  --The best way to quit is to set a quit date, usually a day that has meaning like someone's birthday.  --Start any medication prescribed for quitting one week before you quit date. Then toss out the cigarettes on your quit date.  --If you start smoking again, start from scratch--set another quit day and try again!

## 2017-08-19 DIAGNOSIS — I63532 Cerebral infarction due to unspecified occlusion or stenosis of left posterior cerebral artery: Secondary | ICD-10-CM | POA: Insufficient documentation

## 2017-08-19 DIAGNOSIS — H53462 Homonymous bilateral field defects, left side: Secondary | ICD-10-CM | POA: Insufficient documentation

## 2017-08-19 DIAGNOSIS — I69359 Hemiplegia and hemiparesis following cerebral infarction affecting unspecified side: Secondary | ICD-10-CM | POA: Insufficient documentation

## 2017-08-26 ENCOUNTER — Ambulatory Visit: Payer: Medicaid Other | Attending: Neurology

## 2017-08-26 DIAGNOSIS — G4710 Hypersomnia, unspecified: Secondary | ICD-10-CM | POA: Diagnosis present

## 2017-08-26 DIAGNOSIS — R0683 Snoring: Secondary | ICD-10-CM | POA: Diagnosis present

## 2017-08-26 DIAGNOSIS — G4733 Obstructive sleep apnea (adult) (pediatric): Secondary | ICD-10-CM | POA: Insufficient documentation

## 2017-08-26 DIAGNOSIS — G4761 Periodic limb movement disorder: Secondary | ICD-10-CM | POA: Insufficient documentation

## 2017-09-15 ENCOUNTER — Encounter: Payer: Self-pay | Admitting: *Deleted

## 2017-09-15 ENCOUNTER — Encounter
Admission: RE | Disposition: A | Discharge status: Home / Self Care | Payer: Self-pay | Source: Ambulatory Visit | Attending: Cardiology

## 2017-09-15 ENCOUNTER — Ambulatory Visit
Admission: RE | Admit: 2017-09-15 | Discharge: 2017-09-15 | Disposition: A | Discharge status: Home / Self Care | Payer: Medicaid Other | Source: Ambulatory Visit | Attending: Cardiology | Admitting: Cardiology

## 2017-09-15 DIAGNOSIS — Z8249 Family history of ischemic heart disease and other diseases of the circulatory system: Secondary | ICD-10-CM | POA: Diagnosis not present

## 2017-09-15 DIAGNOSIS — Z7951 Long term (current) use of inhaled steroids: Secondary | ICD-10-CM | POA: Diagnosis not present

## 2017-09-15 DIAGNOSIS — I69354 Hemiplegia and hemiparesis following cerebral infarction affecting left non-dominant side: Secondary | ICD-10-CM | POA: Diagnosis not present

## 2017-09-15 DIAGNOSIS — I10 Essential (primary) hypertension: Secondary | ICD-10-CM | POA: Insufficient documentation

## 2017-09-15 DIAGNOSIS — F329 Major depressive disorder, single episode, unspecified: Secondary | ICD-10-CM | POA: Insufficient documentation

## 2017-09-15 DIAGNOSIS — J449 Chronic obstructive pulmonary disease, unspecified: Secondary | ICD-10-CM | POA: Diagnosis not present

## 2017-09-15 DIAGNOSIS — Z7982 Long term (current) use of aspirin: Secondary | ICD-10-CM | POA: Diagnosis not present

## 2017-09-15 DIAGNOSIS — I4891 Unspecified atrial fibrillation: Secondary | ICD-10-CM | POA: Insufficient documentation

## 2017-09-15 HISTORY — PX: LOOP RECORDER INSERTION: EP1214

## 2017-09-15 SURGERY — LOOP RECORDER INSERTION
Anesthesia: LOCAL

## 2017-09-15 MED ORDER — LIDOCAINE-EPINEPHRINE (PF) 1 %-1:200000 IJ SOLN
INTRAMUSCULAR | Status: DC | PRN
  Administered 2017-09-15: 18 mL

## 2017-09-15 MED ORDER — LIDOCAINE-EPINEPHRINE (PF) 1 %-1:200000 IJ SOLN
INTRAMUSCULAR | Status: AC
  Filled 2017-09-15: qty 30

## 2017-09-15 SURGICAL SUPPLY — 2 items
LOOP REVEAL LINQSYS (Prosthesis & Implant Heart) ×2 IMPLANT
PACK LOOP INSERTION (CUSTOM PROCEDURE TRAY) ×2 IMPLANT

## 2017-09-15 NOTE — Discharge Instructions (Signed)
Incision Care, Adult °An incision is a cut that a doctor makes in your skin for surgery (for a procedure). Most times, these cuts are closed after surgery. Your cut from surgery may be closed with stitches (sutures), staples, skin glue, or skin tape (adhesive strips). You may need to return to your doctor to have stitches or staples taken out. This may happen many days or many weeks after your surgery. The cut needs to be well cared for so it does not get infected. °How to care for your cut °Cut care °· Follow instructions from your doctor about how to take care of your cut. Make sure you: °? Wash your hands with soap and water before you change your bandage (dressing). If you cannot use soap and water, use hand sanitizer. °? Change your bandage as told by your doctor. °? Leave stitches, skin glue, or skin tape in place. They may need to stay in place for 2 weeks or longer. If tape strips get loose and curl up, you may trim the loose edges. Do not remove tape strips completely unless your doctor says it is okay. °· Check your cut area every day for signs of infection. Check for: °? More redness, swelling, or pain. °? More fluid or blood. °? Warmth. °? Pus or a bad smell. °· Ask your doctor how to clean the cut. This may include: °? Using mild soap and water. °? Using a clean towel to pat the cut dry after you clean it. °? Putting a cream or ointment on the cut. Do this only as told by your doctor. °? Covering the cut with a clean bandage. °· Ask your doctor when you can leave the cut uncovered. °· Do not take baths, swim, or use a hot tub until your doctor says it is okay. Ask your doctor if you can take showers. You may only be allowed to take sponge baths for bathing. °Medicines °· If you were prescribed an antibiotic medicine, cream, or ointment, take the antibiotic or put it on the cut as told by your doctor. Do not stop taking or putting on the antibiotic even if your condition gets better. °· Take  over-the-counter and prescription medicines only as told by your doctor. °General instructions °· Limit movement around your cut. This helps healing. °? Avoid straining, lifting, or exercise for the first month, or for as long as told by your doctor. °? Follow instructions from your doctor about going back to your normal activities. °? Ask your doctor what activities are safe. °· Protect your cut from the sun when you are outside for the first 6 months, or for as long as told by your doctor. Put on sunscreen around the scar or cover up the scar. °· Keep all follow-up visits as told by your doctor. This is important. °Contact a doctor if: °· Your have more redness, swelling, or pain around the cut. °· You have more fluid or blood coming from the cut. °· Your cut feels warm to the touch. °· You have pus or a bad smell coming from the cut. °· You have a fever or shaking chills. °· You feel sick to your stomach (nauseous) or you throw up (vomit). °· You are dizzy. °· Your stitches or staples come undone. °Get help right away if: °· You have a red streak coming from your cut. °· Your cut bleeds through the bandage and the bleeding does not stop with gentle pressure. °· The edges of your cut open   up and separate. °· You have very bad (severe) pain. °· You have a rash. °· You are confused. °· You pass out (faint). °· You have trouble breathing and you have a fast heartbeat. °This information is not intended to replace advice given to you by your health care provider. Make sure you discuss any questions you have with your health care provider. °Document Released: 09/20/2011 Document Revised: 03/05/2016 Document Reviewed: 03/05/2016 °Elsevier Interactive Patient Education © 2017 [employer] ° °

## 2017-09-25 ENCOUNTER — Emergency Department: Payer: Medicaid Other

## 2017-09-25 ENCOUNTER — Other Ambulatory Visit: Payer: Self-pay

## 2017-09-25 ENCOUNTER — Emergency Department
Admission: EM | Admit: 2017-09-25 | Discharge: 2017-09-25 | Disposition: A | Discharge status: Home / Self Care | Payer: Medicaid Other | Attending: Emergency Medicine | Admitting: Emergency Medicine

## 2017-09-25 DIAGNOSIS — E860 Dehydration: Secondary | ICD-10-CM | POA: Insufficient documentation

## 2017-09-25 DIAGNOSIS — Z7982 Long term (current) use of aspirin: Secondary | ICD-10-CM | POA: Diagnosis not present

## 2017-09-25 DIAGNOSIS — R531 Weakness: Secondary | ICD-10-CM | POA: Diagnosis present

## 2017-09-25 DIAGNOSIS — I110 Hypertensive heart disease with heart failure: Secondary | ICD-10-CM | POA: Insufficient documentation

## 2017-09-25 DIAGNOSIS — R569 Unspecified convulsions: Secondary | ICD-10-CM | POA: Diagnosis not present

## 2017-09-25 DIAGNOSIS — J449 Chronic obstructive pulmonary disease, unspecified: Secondary | ICD-10-CM | POA: Diagnosis not present

## 2017-09-25 DIAGNOSIS — I509 Heart failure, unspecified: Secondary | ICD-10-CM | POA: Insufficient documentation

## 2017-09-25 DIAGNOSIS — F17210 Nicotine dependence, cigarettes, uncomplicated: Secondary | ICD-10-CM | POA: Diagnosis not present

## 2017-09-25 DIAGNOSIS — Z79899 Other long term (current) drug therapy: Secondary | ICD-10-CM | POA: Diagnosis not present

## 2017-09-25 LAB — CBC
HCT: 49.6 % (ref 40.0–52.0)
Hemoglobin: 16.7 g/dL (ref 13.0–18.0)
MCH: 31.3 pg (ref 26.0–34.0)
MCHC: 33.6 g/dL (ref 32.0–36.0)
MCV: 93 fL (ref 80.0–100.0)
PLATELETS: 148 10*3/uL — AB (ref 150–440)
RBC: 5.34 MIL/uL (ref 4.40–5.90)
RDW: 16.1 % — ABNORMAL HIGH (ref 11.5–14.5)
WBC: 6.8 10*3/uL (ref 3.8–10.6)

## 2017-09-25 LAB — URINALYSIS, COMPLETE (UACMP) WITH MICROSCOPIC
Bilirubin Urine: NEGATIVE
GLUCOSE, UA: 150 mg/dL — AB
KETONES UR: NEGATIVE mg/dL
LEUKOCYTES UA: NEGATIVE
Nitrite: NEGATIVE
Protein, ur: NEGATIVE mg/dL
Specific Gravity, Urine: 1.003 — ABNORMAL LOW (ref 1.005–1.030)
Squamous Epithelial / LPF: NONE SEEN
pH: 7 (ref 5.0–8.0)

## 2017-09-25 LAB — BASIC METABOLIC PANEL
Anion gap: 18 — ABNORMAL HIGH (ref 5–15)
BUN: <5 mg/dL — ABNORMAL LOW (ref 6–20)
CALCIUM: 9.2 mg/dL (ref 8.9–10.3)
CO2: 20 mmol/L — ABNORMAL LOW (ref 22–32)
CREATININE: 1.02 mg/dL (ref 0.61–1.24)
Chloride: 99 mmol/L — ABNORMAL LOW (ref 101–111)
Glucose, Bld: 144 mg/dL — ABNORMAL HIGH (ref 65–99)
Potassium: 3.4 mmol/L — ABNORMAL LOW (ref 3.5–5.1)
SODIUM: 137 mmol/L (ref 135–145)

## 2017-09-25 LAB — ACETAMINOPHEN LEVEL

## 2017-09-25 LAB — ETHANOL: Alcohol, Ethyl (B): <10 mg/dL (ref <10)

## 2017-09-25 LAB — URINE DRUG SCREEN, QUALITATIVE (ARMC ONLY)
Amphetamines, Ur Screen: NOT DETECTED
BENZODIAZEPINE, UR SCRN: NOT DETECTED
Barbiturates, Ur Screen: NOT DETECTED
Cannabinoid 50 Ng, Ur ~~LOC~~: NOT DETECTED
Cocaine Metabolite,Ur ~~LOC~~: NOT DETECTED
MDMA (ECSTASY) UR SCREEN: NOT DETECTED
Methadone Scn, Ur: NOT DETECTED
Opiate, Ur Screen: NOT DETECTED
Phencyclidine (PCP) Ur S: NOT DETECTED
TRICYCLIC, UR SCREEN: NOT DETECTED

## 2017-09-25 LAB — SALICYLATE LEVEL

## 2017-09-25 MED ORDER — DEXTROSE-NACL 5-0.45 % IV SOLN
Freq: Once | INTRAVENOUS | Status: AC
  Administered 2017-09-25: 21:00:00 via INTRAVENOUS

## 2017-09-25 MED ORDER — SODIUM CHLORIDE 0.9 % IV BOLUS (SEPSIS)
1000.0000 mL | Freq: Once | INTRAVENOUS | Status: AC
  Administered 2017-09-25: 1000 mL via INTRAVENOUS

## 2017-09-25 MED ORDER — CHLORDIAZEPOXIDE HCL 25 MG PO CAPS
25.0000 mg | ORAL_CAPSULE | Freq: Once | ORAL | Status: AC
  Administered 2017-09-25: 25 mg via ORAL
  Filled 2017-09-25: qty 1

## 2017-09-25 MED ORDER — CHLORDIAZEPOXIDE HCL 10 MG PO CAPS: 10.0000 mg | ORAL_CAPSULE | Freq: Three times a day (TID) | ORAL | 0 refills | Status: DC

## 2017-09-25 NOTE — ED Notes (Signed)
Patient transported to CT 

## 2017-09-25 NOTE — ED Triage Notes (Signed)
Pt here via ems, pt's mom called 911 due to pt being weak and she was concerned that he may have had a seizure related to not drinking alcohol this am, pt did drink yesterday. Pt states that he was here a couple weeks ago and was told that he had some strokes. Pt states that he remembers the episode that his mother was concerned about

## 2017-09-25 NOTE — ED Provider Notes (Signed)
[facility] Emergency Department Provider Note  ____________________________________________  Time seen: Approximately 7:09 PM  I have reviewed the triage vital signs and the nursing notes.   HISTORY  Chief Complaint Weakness    HPI George Arellano is a 52 y.o. male comes the ED today because of the seizure and malaise for 24 hours. He's never had a seizure before. He is an alcoholic and drinks daily, last drink was last night. Denies a history of withdrawal syndrome in the past. No hallucinations. No other ingestions. No recent trauma. Seizure occurred this morning, lasted for 3-5 minutes, resolve spontaneously, generalized tonic-clonic activity according to mother who witnessed it. Patient denies any preceding symptoms, denies any headache currently. Mom notes that he was confused for a period of time afterward     Past Medical History:  Diagnosis Date  . Anxiety   . CHF (congestive heart failure) ([place])   . COPD (chronic obstructive pulmonary disease) ([place])   . Hypertension      Patient Active Problem List   Diagnosis Date Noted  . Acute respiratory failure with hypoxemia ([place]) 07/18/2017  . Community acquired pneumonia 05/05/2015     Past Surgical History:  Procedure Laterality Date  . LOOP RECORDER INSERTION N/A 09/15/2017   Procedure: LOOP RECORDER INSERTION;  Surgeon: Isaias Cowman, MD;  Location: [facility];  Service: Cardiovascular;  Laterality: N/A;     Prior to Admission medications   Medication Sig Start Date End Date Taking? Authorizing Provider  albuterol (PROVENTIL HFA;VENTOLIN HFA) 108 (90 BASE) MCG/ACT inhaler Inhale 2 puffs into the lungs every 6 (six) hours as needed for wheezing or shortness of breath.    [provider]  aspirin 325 MG tablet Take 1 tablet (325 mg total) by mouth daily. 08/02/17   Max Sane, MD  budesonide-formoterol [facility]) 160-4.5 MCG/ACT inhaler Inhale 2 puffs into the lungs 2  (two) times daily.    [provider]  chlordiazePOXIDE (LIBRIUM) 10 MG capsule Take 1 capsule (10 mg total) by mouth 3 (three) times daily. 09/25/17   Carrie Mew, MD  cholecalciferol (VITAMIN D) 1000 units tablet Take 2,000 Units by mouth daily.    [provider]  folic acid (FOLVITE) 1 MG tablet Take 1 tablet (1 mg total) by mouth daily. 08/02/17   Max Sane, MD  gabapentin (NEURONTIN) 300 MG capsule Take 300 mg by mouth at bedtime. 08/15/17 02/11/18  [provider]  Ipratropium-Albuterol (COMBIVENT RESPIMAT) 20-100 MCG/ACT AERS respimat Inhale 2 puffs into the lungs every 6 (six) hours as needed for wheezing or shortness of breath.    [provider]  ipratropium-albuterol (DUONEB) 0.5-2.5 (3) MG/3ML SOLN Take 3 mLs by nebulization every 6 (six) hours as needed (for shortness of breath/wheezing).    [provider]  Multiple Vitamins-Minerals (MULTIVITAMIN WITH MINERALS) tablet Take 1 tablet by mouth daily.    [provider]  nicotine (NICODERM CQ - DOSED IN MG/24 HOURS) 14 mg/24hr patch Place 1 patch (14 mg total) onto the skin daily. Patient not taking: Reported on 09/15/2017 08/02/17   Max Sane, MD  OXYGEN Inhale 2 L into the lungs at bedtime.    [provider]  thiamine 100 MG tablet Take 1 tablet (100 mg total) by mouth daily. 08/02/17   Max Sane, MD  vitamin B-12 (CYANOCOBALAMIN) 1000 MCG tablet Take 1,000 mcg by mouth daily.    [provider]     Allergies Atorvastatin and Levaquin [levofloxacin]   Family History  Problem Relation Age of Onset  . Hypertension Other     Social History Social History   Tobacco Use  . Smoking status: Current Every Day Smoker    Packs/day: 1.00    Types: Cigarettes  . Smokeless tobacco: Never Used  Substance Use Topics  . Alcohol use: Yes    Alcohol/week: 25.2 oz    Types: 42 Cans of beer per week  . Drug use: Yes    Types: Marijuana    Review of  Systems  Constitutional:   No fever or chills.  ENT:   No sore throat. No rhinorrhea. Cardiovascular:   No chest pain or syncope. Respiratory:   No dyspnea or cough. Gastrointestinal:   Negative for abdominal pain, vomiting and diarrhea.   All other systems reviewed and are negative except as documented above in ROS and HPI.  ____________________________________________   PHYSICAL EXAM:  VITAL SIGNS: ED Triage Vitals [09/25/17 1756]  Enc Vitals Group     BP (!) 142/99     Pulse Rate (!) 110     Resp      Temp 98 F (36.7 C)     Temp Source Oral     SpO2 98 %     Weight 156 lb (70.8 kg)     Height 5\' 8"  (1.727 m)     Head Circumference      Peak Flow      Pain Score      Pain Loc      Pain Edu?      Excl. in [place]?     Vital signs reviewed, nursing assessments reviewed.   Constitutional:   Alert and oriented. not in distress Eyes:   No scleral icterus.  EOMI. No nystagmus. No conjunctival pallor. PERRL. ENT   Head:   Normocephalic and atraumatic.   Nose:   No congestion/rhinnorhea. no epistaxis   Mouth/Throat:   MMM, no pharyngeal erythema. No peritonsillar mass. very slight right lateral tongue abrasion. No hematoma   Neck:   No meningismus. Full ROM. Hematological/Lymphatic/Immunilogical:   No cervical lymphadenopathy. Cardiovascular:   tachycardia heart rate 110. Symmetric bilateral radial and DP pulses.  No murmurs.  Respiratory:   Normal respiratory effort without tachypnea/retractions. diffuse expiratory wheezing, no focal crackles Gastrointestinal:   Soft and nontender. Non distended. There is no CVA tenderness.  No rebound, rigidity, or guarding. Genitourinary:   deferred Musculoskeletal:   Normal range of motion in all extremities. No joint effusions.  No lower extremity tenderness.  No edema. Neurologic:   Normal speech and language.  mild fine tremor in hands. No flushing diaphoresis or anxiousness Motor grossly at baseline concern prior  strokes. cranial nerves III through XII intact No acute focal neurologic deficits are appreciated.  Skin:    Skin is warm, dry and intact. No rash noted.  No petechiae, purpura, or bullae.  ____________________________________________    LABS (pertinent positives/negatives) (all labs ordered are listed, but only abnormal results are displayed) Labs Reviewed  BASIC METABOLIC PANEL - Abnormal; Notable for the following components:      Result Value   Potassium 3.4 (*)    Chloride 99 (*)    CO2 20 (*)    Glucose, Bld 144 (*)    BUN <5 (*)    Anion gap 18 (*)    All other components within normal limits  CBC - Abnormal; Notable for the following components:   RDW 16.1 (*)    Platelets 148 (*)    All other  components within normal limits  URINALYSIS, COMPLETE (UACMP) WITH MICROSCOPIC - Abnormal; Notable for the following components:   Color, Urine COLORLESS (*)    APPearance CLEAR (*)    Specific Gravity, Urine 1.003 (*)    Glucose, UA 150 (*)    Hgb urine dipstick SMALL (*)    Bacteria, UA RARE (*)    All other components within normal limits  ACETAMINOPHEN LEVEL - Abnormal; Notable for the following components:   Acetaminophen (Tylenol), Serum <10 (*)    All other components within normal limits  ETHANOL  SALICYLATE LEVEL  URINE DRUG SCREEN, QUALITATIVE ([facility] ONLY)  CBG MONITORING, ED   ____________________________________________   EKG    ____________________________________________    RADIOLOGY  Ct Head Wo Contrast  Result Date: 09/25/2017 CLINICAL DATA:  Weakness with possible seizure. EXAM: CT HEAD WITHOUT CONTRAST TECHNIQUE: Contiguous axial images were obtained from the base of the skull through the vertex without intravenous contrast. COMPARISON:  07/26/2017 CT FINDINGS: Brain: Redemonstration of chronic right PCA territory infarct with encephalomalacia involving the mesial right temporal and occipital lobes as well as posterior right thalamus and posterior  limb of right internal capsule. Redemonstration of bilateral superior cerebellar infarcts. No acute intracranial hemorrhage, midline shift or edema. No hydrocephalus. No intra-axial mass nor extra-axial fluid collections. Midline fourth ventricle and basal cisterns without effacement. Vascular: Moderate atherosclerosis cavernous internal carotid arteries. No hyperdense vessel sign. Skull: Normal. Negative for fracture or focal lesion. Sinuses/Orbits: No acute finding. Other: None IMPRESSION: 1. Redemonstration of chronic right PCA territory infarct with encephalomalacia. Redemonstration of bilateral small superior cerebellar infarcts. 2. No acute intracranial abnormality or significant change from prior. Electronically Signed   By: Ashley Royalty M.D.   On: 09/25/2017 20:23    ____________________________________________   PROCEDURES Procedures  ____________________________________________  DIFFERENTIAL DIAGNOSIS   intracranial hemorrhage, intracranial mass effect, alcohol withdrawal syndrome  CLINICAL IMPRESSION / ASSESSMENT AND PLAN / ED COURSE  Pertinent labs & imaging results that were available during my care of the patient were reviewed by me and considered in my medical decision making (see chart for details).     Clinical Course as of Sep 25 2308  Sun Sep 25, 2017  1906 P/w new seizure. possible alcohol withdrawal. Check CT head for ICH or mass effect. Labs c/w metabolic acidosis, possible AKA/SKA from inadequate hydration and calorie intake. Will give IVF.   [PS]  2045 Ct head without acute infarct or ICH.  [PS]  2205 Feels asymptomatic. Drinking fluids. Tachycardia improved with IVF. PO trial, f/u ua, would be suitable for DC home if able to eat.   [PS]    Clinical Course User Index [PS] Carrie Mew, MD     ----------------------------------------- 11:11 PM on 09/25/2017 -----------------------------------------  Given lack of any evolving symptoms, I think that the  patient's presentation and possible reported seizure are not related to alcohol withdrawal. He has no other signs or symptoms. Syndrome on Librium for the next 3 days for prophylaxis, otherwise follow-up with neurology and primary care.  ____________________________________________   FINAL CLINICAL IMPRESSION(S) / ED DIAGNOSES    Final diagnoses:  Dehydration  Seizure [facility])     ED Discharge Orders        Ordered    chlordiazePOXIDE (LIBRIUM) 10 MG capsule  3 times daily     09/25/17 2309      Portions of this note were generated with dragon dictation software. Dictation errors may occur despite best attempts at proofreading.    Carrie Mew,  MD 09/25/17 2311

## 2017-09-26 NOTE — ED Notes (Signed)
Pt was discharged by Manuela Schwartz, RN

## 2017-11-15 ENCOUNTER — Ambulatory Visit: Payer: Medicaid Other | Attending: Internal Medicine

## 2017-11-15 DIAGNOSIS — J449 Chronic obstructive pulmonary disease, unspecified: Secondary | ICD-10-CM | POA: Diagnosis not present

## 2017-11-15 DIAGNOSIS — J40 Bronchitis, not specified as acute or chronic: Secondary | ICD-10-CM | POA: Insufficient documentation

## 2017-11-15 DIAGNOSIS — J440 Chronic obstructive pulmonary disease with acute lower respiratory infection: Secondary | ICD-10-CM | POA: Insufficient documentation

## 2017-11-15 MED ORDER — ALBUTEROL SULFATE (2.5 MG/3ML) 0.083% IN NEBU
2.5000 mg | INHALATION_SOLUTION | Freq: Once | RESPIRATORY_TRACT | Status: AC
  Administered 2017-11-15: 2.5 mg via RESPIRATORY_TRACT
  Filled 2017-11-15: qty 3

## 2018-01-06 ENCOUNTER — Ambulatory Visit: Payer: Medicaid Other | Admitting: Internal Medicine

## 2018-01-06 DIAGNOSIS — G40909 Epilepsy, unspecified, not intractable, without status epilepticus: Secondary | ICD-10-CM | POA: Insufficient documentation

## 2018-01-10 NOTE — Progress Notes (Signed)
* [facility]     Assessment and Plan:  COPD group D with  exacerbation requiring hospitalization. -Continue Combivent, Symbicort, nebulizer, albuterol metered-dose inhaler. - Continue to advance activity as tolerated.  Underlying alcoholic cirrhosis, alcohol abuse.  - Currently in remission.  CVA with unstable gait, deconditioning, weakness.  -Currently undergoing rehab, encouraged to continue.  Obstructive sleep apnea.  - Symptoms and signs of obstructive sleep apnea. - Dr. Manuella Ghazi who is his neurologist is following the patient for OSA.   Nicotine abuse.  -Discussed the importance of smoke cessation for 3 minutes, including setting a quit date, spent 3 minutes in discussion.  Return in about 6 months (around 07/14/2018).    Date: 01/10/2018  MRN# [id number] George Arellano 06-16-1966   George Arellano is a 52 y.o. old male seen in follow up for chief complaint of  Chief Complaint  Patient presents with  . COPD    pt states breathing stable he has a dry smokers cough,  . Wheezing    daily.. pt denies chest tightness/pressure.     HPI:  The patient is a 52 year old male with a history of stroke, COPD.  Last visit he had symptoms and signs of obstructive sleep apnea, his neurologist had sent him for sleep study which was apparently negative. He is getting winded walking up the driveway. He is using symbicort 2 puffs twice per day, combivent 2 puffs bid, proair 2 puffs as needed which is usually twice daily, he uses nebs about 2 or 3 times per week.  He is still smoking about a pack per day, he tried chantix one time but it was not helpful.  He can not use patches because they will not stay on him.  He is not really thinking about quitting.    Desat walk 08/16/2017>>Desat walk at rest on RA 98% and HR 93; walked 932 feet at uncertain gait, conversational, minimal dyspnea, sat was 94% and HR 98.   Medication:    Current Outpatient Medications:  .  albuterol  (PROVENTIL HFA;VENTOLIN HFA) 108 (90 BASE) MCG/ACT inhaler, Inhale 2 puffs into the lungs every 6 (six) hours as needed for wheezing or shortness of breath., Disp: , Rfl:  .  aspirin 325 MG tablet, Take 1 tablet (325 mg total) by mouth daily., Disp: 30 tablet, Rfl: 0 .  budesonide-formoterol (SYMBICORT) 160-4.5 MCG/ACT inhaler, Inhale 2 puffs into the lungs 2 (two) times daily., Disp: , Rfl:  .  chlordiazePOXIDE (LIBRIUM) 10 MG capsule, Take 1 capsule (10 mg total) by mouth 3 (three) times daily., Disp: 9 capsule, Rfl: 0 .  cholecalciferol (VITAMIN D) 1000 units tablet, Take 2,000 Units by mouth daily., Disp: , Rfl:  .  folic acid (FOLVITE) 1 MG tablet, Take 1 tablet (1 mg total) by mouth daily., Disp: 30 tablet, Rfl: 0 .  gabapentin (NEURONTIN) 300 MG capsule, Take 300 mg by mouth at bedtime., Disp: , Rfl:  .  Ipratropium-Albuterol (COMBIVENT RESPIMAT) 20-100 MCG/ACT AERS respimat, Inhale 2 puffs into the lungs every 6 (six) hours as needed for wheezing or shortness of breath., Disp: , Rfl:  .  ipratropium-albuterol (DUONEB) 0.5-2.5 (3) MG/3ML SOLN, Take 3 mLs by nebulization every 6 (six) hours as needed (for shortness of breath/wheezing)., Disp: , Rfl:  .  Multiple Vitamins-Minerals (MULTIVITAMIN WITH MINERALS) tablet, Take 1 tablet by mouth daily., Disp: , Rfl:  .  nicotine (NICODERM CQ - DOSED IN MG/24 HOURS) 14 mg/24hr patch, Place 1 patch (14 mg total) onto the skin  daily. (Patient not taking: Reported on 09/15/2017), Disp: 28 patch, Rfl: 0 .  OXYGEN, Inhale 2 L into the lungs at bedtime., Disp: , Rfl:  .  thiamine 100 MG tablet, Take 1 tablet (100 mg total) by mouth daily., Disp: 30 tablet, Rfl: 0 .  vitamin B-12 (CYANOCOBALAMIN) 1000 MCG tablet, Take 1,000 mcg by mouth daily., Disp: , Rfl:    Allergies:  Atorvastatin and Levaquin [levofloxacin]  Review of Systems:  Constitutional: Feels well. Cardiovascular: No chest pain.  Pulmonary: Denies dyspnea.   The remainder of systems were  reviewed and were found to be negative other than what is documented in the HPI.   Physical Examination:   VS: BP (!) 150/100 (BP Location: Left Arm, Cuff Size: Normal)   Pulse 92   Resp 16   Ht 5\' 8"  (1.727 m)   Wt 161 lb (73 kg)   SpO2 96%   BMI 24.48 kg/m   General Appearance: No distress  Neuro:without focal findings, mental status, speech normal, alert and oriented HEENT: PERRLA, EOM intact Pulmonary: decreased air entry bilaterally.  CardiovascularNormal S1,S2.  No m/r/g.  Abdomen: Benign, Soft, non-tender, No masses Renal:  No costovertebral tenderness  GU:  No performed at this time. Endoc: No evident thyromegaly, no signs of acromegaly or Cushing features Skin:   warm, no rashes, no ecchymosis  Extremities: normal, no cyanosis, clubbing.      LABORATORY PANEL:   CBC No results for input(s): WBC, HGB, HCT, PLT in the last 168 hours. ------------------------------------------------------------------------------------------------------------------  Chemistries  No results for input(s): NA, K, CL, CO2, GLUCOSE, BUN, CREATININE, CALCIUM, MG, AST, ALT, ALKPHOS, BILITOT in the last 168 hours.  Invalid input(s): GFRCGP ------------------------------------------------------------------------------------------------------------------  Cardiac Enzymes No results for input(s): TROPONINI in the last 168 hours. ------------------------------------------------------------  RADIOLOGY:   No results found for this or any previous visit. Results for orders placed during the hospital encounter of 07/18/17  DG Chest 2 View   Narrative CLINICAL DATA:  Pneumonia  EXAM: CHEST  2 VIEW  COMPARISON:  07/26/2017  FINDINGS: Bibasilar airspace unchanged. Left upper lobe infiltrate unchanged. Small bilateral pleural effusions unchanged. Negative for heart failure.  IMPRESSION: Bilateral airspace disease unchanged, probable pneumonia   Electronically Signed   By: Franchot Gallo M.D.   On: 07/27/2017 09:51    ------------------------------------------------------------------------------------------------------------------  Thank  you for allowing [facility] to assist in the care of your patient. Our recommendations are noted above.  Please contact us if we can be of further service.   Marda Stalker, M.D., F.C.C.P.  Board Certified in Internal Medicine, Pulmonary Medicine, [place], and Sleep Medicine.  [facility] Office Number: [phone]  01/10/2018

## 2018-01-11 ENCOUNTER — Encounter: Payer: Self-pay | Admitting: Internal Medicine

## 2018-01-11 ENCOUNTER — Ambulatory Visit: Payer: Medicaid Other | Admitting: Internal Medicine

## 2018-01-11 VITALS — BP 150/100 | HR 92 | Resp 16 | Ht 68.0 in | Wt 161.0 lb

## 2018-01-11 DIAGNOSIS — F17210 Nicotine dependence, cigarettes, uncomplicated: Secondary | ICD-10-CM

## 2018-01-11 DIAGNOSIS — J449 Chronic obstructive pulmonary disease, unspecified: Secondary | ICD-10-CM | POA: Diagnosis not present

## 2018-01-11 NOTE — Patient Instructions (Signed)
--  Quitting smoking is the most important thing that you can do for your health.  --Quitting smoking will have greater affect on your health than any medicine that we can give you.   Continue currently medications.

## 2018-03-03 ENCOUNTER — Other Ambulatory Visit: Payer: Self-pay

## 2018-03-03 ENCOUNTER — Emergency Department: Payer: Medicaid Other

## 2018-03-03 ENCOUNTER — Emergency Department
Admission: EM | Admit: 2018-03-03 | Discharge: 2018-03-03 | Disposition: A | Discharge status: Home / Self Care | Payer: Medicaid Other | Attending: Emergency Medicine | Admitting: Emergency Medicine

## 2018-03-03 DIAGNOSIS — I509 Heart failure, unspecified: Secondary | ICD-10-CM | POA: Diagnosis not present

## 2018-03-03 DIAGNOSIS — J449 Chronic obstructive pulmonary disease, unspecified: Secondary | ICD-10-CM | POA: Insufficient documentation

## 2018-03-03 DIAGNOSIS — F17210 Nicotine dependence, cigarettes, uncomplicated: Secondary | ICD-10-CM | POA: Diagnosis not present

## 2018-03-03 DIAGNOSIS — R260 Ataxic gait: Secondary | ICD-10-CM | POA: Insufficient documentation

## 2018-03-03 DIAGNOSIS — I110 Hypertensive heart disease with heart failure: Secondary | ICD-10-CM | POA: Diagnosis not present

## 2018-03-03 DIAGNOSIS — Z7982 Long term (current) use of aspirin: Secondary | ICD-10-CM | POA: Insufficient documentation

## 2018-03-03 DIAGNOSIS — Z8673 Personal history of transient ischemic attack (TIA), and cerebral infarction without residual deficits: Secondary | ICD-10-CM | POA: Insufficient documentation

## 2018-03-03 DIAGNOSIS — R42 Dizziness and giddiness: Secondary | ICD-10-CM

## 2018-03-03 DIAGNOSIS — Z79899 Other long term (current) drug therapy: Secondary | ICD-10-CM | POA: Insufficient documentation

## 2018-03-03 LAB — CBC
HCT: 45.9 % (ref 40.0–52.0)
HEMOGLOBIN: 16.7 g/dL (ref 13.0–18.0)
MCH: 36.3 pg — ABNORMAL HIGH (ref 26.0–34.0)
MCHC: 36.4 g/dL — ABNORMAL HIGH (ref 32.0–36.0)
MCV: 99.6 fL (ref 80.0–100.0)
Platelets: 199 10*3/uL (ref 150–440)
RBC: 4.61 MIL/uL (ref 4.40–5.90)
RDW: 12 % (ref 11.5–14.5)
WBC: 8 10*3/uL (ref 3.8–10.6)

## 2018-03-03 LAB — BASIC METABOLIC PANEL
ANION GAP: 9 (ref 5–15)
BUN: 9 mg/dL (ref 6–20)
CO2: 27 mmol/L (ref 22–32)
Calcium: 10 mg/dL (ref 8.9–10.3)
Chloride: 101 mmol/L (ref 98–111)
Creatinine, Ser: 0.79 mg/dL (ref 0.61–1.24)
Glucose, Bld: 96 mg/dL (ref 70–99)
Potassium: 4.3 mmol/L (ref 3.5–5.1)
SODIUM: 137 mmol/L (ref 135–145)

## 2018-03-03 LAB — TROPONIN I

## 2018-03-03 MED ORDER — SODIUM CHLORIDE 0.9 % IV BOLUS
1000.0000 mL | Freq: Once | INTRAVENOUS | Status: AC
  Administered 2018-03-03: 1000 mL via INTRAVENOUS

## 2018-03-03 NOTE — ED Notes (Signed)
Patient transported to MRI 

## 2018-03-03 NOTE — ED Notes (Signed)
Pt given urinal and informed of needing urine sample.

## 2018-03-03 NOTE — Discharge Instructions (Signed)
It was a pleasure to take care of you today, and thank you for coming to our emergency department.  If you have any questions or concerns before leaving please ask the nurse to grab me and I'm more than happy to go through your aftercare instructions again.  If you were prescribed any opioid pain medication today such as Norco, Vicodin, Percocet, morphine, hydrocodone, or oxycodone please make sure you do not drive when you are taking this medication as it can alter your ability to drive safely.  If you have any concerns once you are home that you are not improving or are in fact getting worse before you can make it to your follow-up appointment, please do not hesitate to call [phone] and come back for further evaluation.  Darel Hong, MD  Results for orders placed or performed during the hospital encounter of 71/06/26  Basic metabolic panel  Result Value Ref Range   Sodium 137 135 - 145 mmol/L   Potassium 4.3 3.5 - 5.1 mmol/L   Chloride 101 98 - 111 mmol/L   CO2 27 22 - 32 mmol/L   Glucose, Bld 96 70 - 99 mg/dL   BUN 9 6 - 20 mg/dL   Creatinine, Ser 0.79 0.61 - 1.24 mg/dL   Calcium 10.0 8.9 - 10.3 mg/dL   GFR calc non Af Amer >60 >60 mL/min   GFR calc Af Amer >60 >60 mL/min   Anion gap 9 5 - 15  CBC  Result Value Ref Range   WBC 8.0 3.8 - 10.6 K/uL   RBC 4.61 4.40 - 5.90 MIL/uL   Hemoglobin 16.7 13.0 - 18.0 g/dL   HCT 45.9 40.0 - 52.0 %   MCV 99.6 80.0 - 100.0 fL   MCH 36.3 (H) 26.0 - 34.0 pg   MCHC 36.4 (H) 32.0 - 36.0 g/dL   RDW 12.0 11.5 - 14.5 %   Platelets 199 150 - 440 K/uL  Troponin I  Result Value Ref Range   Troponin I <0.03 <0.03 ng/mL   Ct Head Wo Contrast  Result Date: 03/03/2018 CLINICAL DATA:  Dizziness for several hours EXAM: CT HEAD WITHOUT CONTRAST TECHNIQUE: Contiguous axial images were obtained from the base of the skull through the vertex without intravenous contrast. COMPARISON:  09/25/2017 FINDINGS: Brain: Encephalomalacia changes are again identified in  the distribution of the right PCA consistent with prior infarct. The overall appearance is similar to that seen on the prior exam. Mild atrophic changes are noted. No new findings to suggest acute hemorrhage, acute infarction or space-occupying mass lesion are noted. Vascular: No hyperdense vessel or unexpected calcification. Skull: Normal. Negative for fracture or focal lesion. Sinuses/Orbits: No acute finding. Other: None. IMPRESSION: Changes consistent with chronic right PCA infarct. Mild atrophy. No acute abnormality noted. Electronically Signed   By: Inez Catalina M.D.   On: 03/03/2018 12:33   Mr Brain Wo Contrast (neuro Protocol)  Result Date: 03/03/2018 CLINICAL DATA:  52 y/o M; woke this a.m. with dizziness. History of stroke. EXAM: MRI HEAD WITHOUT CONTRAST TECHNIQUE: Multiplanar, multiecho pulse sequences of the brain and surrounding structures were obtained without intravenous contrast. COMPARISON:  03/03/2018 CT head.  07/27/2017 MRI head. FINDINGS: Brain: Right PCA chronic infarction involving posterior parietal, medial temporal, and the occipital lobes. Interval laminar necrosis and volume loss. Additional small chronic infarctions are present within the left occipital lobe, bilateral cerebellar hemispheres, and the right thalamus. There is faint hemosiderin staining within the right PCA distribution infarction. No new focus of  reduced diffusion to suggest acute or early subacute infarction. No mass effect, hydrocephalus, extra-axial collection, or herniation. Mild volume loss of the brain. Vascular: Normal flow voids. Skull and upper cervical spine: Normal marrow signal. Sinuses/Orbits: Negative. Other: None. IMPRESSION: 1. No acute intracranial abnormality identified. 2. Large chronic right PCA distribution infarction with interval volume loss and laminar necrosis. Additional small chronic infarctions in bilateral cerebellar hemispheres, left occipital lobe, and right thalamus. 3. Mild volume loss  of the brain. Electronically Signed   By: Kristine Garbe M.D.   On: 03/03/2018 17:23

## 2018-03-03 NOTE — ED Provider Notes (Signed)
[facility] Emergency Department Provider Note  ____________________________________________   First MD Initiated Contact with Patient 03/03/18 1130     (approximate)  I have reviewed the triage vital signs and the nursing notes.   HISTORY  Chief Complaint Dizziness   HPI George Arellano is a 52 y.o. male who self presents to the emergency department with dizziness and lightheadedness that began when he awoke this morning.  He checked his blood pressure at home and it was 145/108 which concerned him as he has had a stroke in the past and prompted the visit today.   He has residual left-sided weakness from his previous stroke.  His "dizziness" is described as a sense of imbalance that is worse when standing up and does not completely resolve when he is still although is improved.  Symptoms are moderate severity.  No numbness or weakness.  No chest pain or shortness of breath.   Past Medical History:  Diagnosis Date  . Anxiety   . CHF (congestive heart failure) ([place])   . COPD (chronic obstructive pulmonary disease) ([place])   . Hypertension     Patient Active Problem List   Diagnosis Date Noted  . 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 Arterial ischemic stroke, PCA (posterior cerebral artery), left, acute ([place]) 08/19/2017  . Hemiparesis affecting nondominant side as late effect of stroke ([place]) 08/19/2017  . 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 Acute respiratory failure with hypoxemia ([place]) 07/18/2017  . Alcoholism ([place]) 08/16/2016  . Downbeat nystagmus 08/16/2016  . 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 Community acquired pneumonia 05/05/2015    Past Surgical History:  Procedure Laterality Date  . LOOP RECORDER INSERTION N/A 09/15/2017   Procedure: LOOP RECORDER INSERTION;  Surgeon: Isaias Cowman, MD;  Location: [facility];  Service: Cardiovascular;  Laterality: N/A;    Prior to Admission medications     Medication Sig Start Date End Date Taking? Authorizing Provider  albuterol (PROVENTIL HFA;VENTOLIN HFA) 108 (90 BASE) MCG/ACT inhaler Inhale 2 puffs into the lungs every 6 (six) hours as needed for wheezing or shortness of breath.    [provider]  aspirin 325 MG tablet Take 1 tablet (325 mg total) by mouth daily. 08/02/17   Max Sane, MD  budesonide-formoterol [facility]) 160-4.5 MCG/ACT inhaler Inhale 2 puffs into the lungs 2 (two) times daily.    [provider]  chlordiazePOXIDE (LIBRIUM) 10 MG capsule Take 1 capsule (10 mg total) by mouth 3 (three) times daily. 09/25/17   Carrie Mew, MD  cholecalciferol (VITAMIN D) 1000 units tablet Take 2,000 Units by mouth daily.    [provider]  folic acid (FOLVITE) 1 MG tablet Take 1 tablet (1 mg total) by mouth daily. 08/02/17   Max Sane, MD  gabapentin (NEURONTIN) 300 MG capsule Take 300 mg by mouth at bedtime. 08/15/17 02/11/18  [provider]  Ipratropium-Albuterol (COMBIVENT RESPIMAT) 20-100 MCG/ACT AERS respimat Inhale 2 puffs into the lungs every 6 (six) hours as needed for wheezing or shortness of breath.    [provider]  ipratropium-albuterol (DUONEB) 0.5-2.5 (3) MG/3ML SOLN Take 3 mLs by nebulization every 6 (six) hours as needed (for shortness of breath/wheezing).    [provider]  lamoTRIgine (LAMICTAL) 25 MG tablet Take 25 mg by mouth 2 (two) times daily. 01/06/18   [provider]  metoprolol tartrate (LOPRESSOR) 50 MG tablet Take 50 mg by mouth daily. 12/07/17   [provider]  Multiple Vitamins-Minerals (MULTIVITAMIN  WITH MINERALS) tablet Take 1 tablet by mouth daily.    [provider]  nicotine (NICODERM CQ - DOSED IN MG/24 HOURS) 14 mg/24hr patch Place 1 patch (14 mg total) onto the skin daily. 08/02/17   Max Sane, MD  OXYGEN Inhale 2 L into the lungs at bedtime.    [provider]  thiamine 100 MG tablet Take 1 tablet (100 mg total)  by mouth daily. 08/02/17   Max Sane, MD  vitamin B-12 (CYANOCOBALAMIN) 1000 MCG tablet Take 1,000 mcg by mouth daily.    [provider]    Allergies Atorvastatin and Levaquin [levofloxacin]  Family History  Problem Relation Age of Onset  . Hypertension Other     Social History Social History   Tobacco Use  . Smoking status: Current Every Day Smoker    Packs/day: 1.00    Types: Cigarettes  . Smokeless tobacco: Never Used  Substance Use Topics  . Alcohol use: Yes    Alcohol/week: 42.0 standard drinks    Types: 42 Cans of beer per week  . Drug use: Yes    Types: Marijuana    Review of Systems Constitutional: No fever/chills Eyes: No visual changes. ENT: No sore throat. Cardiovascular: Denies chest pain. Respiratory: Denies shortness of breath. Gastrointestinal: No abdominal pain.  No nausea, no vomiting.  No diarrhea.  No constipation. Genitourinary: Negative for dysuria. Musculoskeletal: Negative for back pain. Skin: Negative for rash. Neurological: Negative for headaches, focal weakness or numbness.   ____________________________________________   PHYSICAL EXAM:  VITAL SIGNS: ED Triage Vitals [03/03/18 1117]  Enc Vitals Group     BP (!) 152/124     Pulse Rate 89     Resp 18     Temp 98.1 F (36.7 C)     Temp Source Oral     SpO2 95 %     Weight 165 lb (74.8 kg)     Height 5\' 8"  (1.727 m)     Head Circumference      Peak Flow      Pain Score 0     Pain Loc      Pain Edu?      Excl. in [place]?     Constitutional: Alert and oriented x4 pleasant cooperative speaks in full clear sentences Eyes: PERRL EOMI. midrange and brisk.  He does have some rightward beating nystagmus Head: Atraumatic. Nose: No congestion/rhinnorhea. Mouth/Throat: No trismus Neck: No stridor.  No meningismus Cardiovascular: Normal rate, regular rhythm. Grossly normal heart sounds.  Good peripheral circulation. Respiratory: Normal respiratory effort.  No retractions. Lungs  CTAB and moving good air Gastrointestinal: Soft nontender Musculoskeletal: No lower extremity edema   Neurologic: He walks with an ataxic gait although he says this is persisted from his previous stroke.  He has clumsiness of his left hand and a little bit in the right.  Strength is 4 out of 5 left upper and left lower and 5 out of 5 right upper right lower Skin:  Skin is warm, dry and intact. No rash noted. Psychiatric: Mood and affect are normal. Speech and behavior are normal.    ____________________________________________   DIFFERENTIAL includes but not limited to  Central vertigo, peripheral vertigo, dehydration, recrudescence ____________________________________________   LABS (all labs ordered are listed, but only abnormal results are displayed)  Labs Reviewed  CBC - Abnormal; Notable for the following components:      Result Value   MCH 36.3 (*)    MCHC 36.4 (*)    All  other components within normal limits  BASIC METABOLIC PANEL  TROPONIN I  CBG MONITORING, ED    Lab work reviewed by me with no acute disease __________________________________________  EKG  ED ECG REPORT I, Darel Hong, the attending physician, personally viewed and interpreted this ECG.  Date: 03/03/2018 EKG Time:  Rate: 88 Rhythm: normal sinus rhythm QRS Axis: normal Intervals: normal ST/T Wave abnormalities: normal Narrative Interpretation: no evidence of acute ischemia  ____________________________________________  RADIOLOGY  CT scan of the head reviewed by me with old stroke but no acute disease MRI of the brain reviewed by me with old stroke but no acute disease ____________________________________________   PROCEDURES  Procedure(s) performed: no  Procedures  Critical Care performed: no  ____________________________________________   INITIAL IMPRESSION / ASSESSMENT AND PLAN / ED COURSE  Pertinent labs & imaging results that were available during my care of the  patient were reviewed by me and considered in my medical decision making (see chart for details).   As part of my medical decision making, I reviewed the following data within the [place] History obtained from family if available, nursing notes, old chart and ekg, as well as notes from prior ED visits.  Exam is challenging to obtain on this patient given his previous stroke although he does report what sounds like possible new central vertigo.  CT scan obtained quickly with no acute disease.  Given his significant risk factors I do think he requires an MRI of his brain.  Some IV fluids in the meantime.  Fortunately the patient's MRI is reassuring he feels improved.  At this point is medically stable for outpatient management verbalizes understanding and agreement with the plan.      ____________________________________________   FINAL CLINICAL IMPRESSION(S) / ED DIAGNOSES  Final diagnoses:  Dizziness      NEW MEDICATIONS STARTED DURING THIS VISIT:  Discharge Medication List as of 03/03/2018  6:08 PM       Note:  This document was prepared using Dragon voice recognition software and may include unintentional dictation errors.     Darel Hong, MD 03/05/18 [phone]

## 2018-03-03 NOTE — ED Triage Notes (Signed)
Pt states that he woke up this am with dizziness, states that this happens frequently, pt hx of strokes in the past, dizzy since 5am, mom states that this happens to him a lot, checked his bp at home this am with 145/108 reading

## 2018-03-03 NOTE — ED Notes (Signed)
MRI called and states it will be 30-45 minutes. MD aware. Will update pt

## 2018-07-27 ENCOUNTER — Ambulatory Visit: Payer: Medicaid Other | Attending: Internal Medicine

## 2018-07-27 DIAGNOSIS — J449 Chronic obstructive pulmonary disease, unspecified: Secondary | ICD-10-CM | POA: Insufficient documentation

## 2018-07-27 MED ORDER — ALBUTEROL SULFATE (2.5 MG/3ML) 0.083% IN NEBU
2.5000 mg | INHALATION_SOLUTION | Freq: Once | RESPIRATORY_TRACT | Status: AC
  Administered 2018-07-27: 2.5 mg via RESPIRATORY_TRACT
  Filled 2018-07-27: qty 3

## 2018-07-31 NOTE — Progress Notes (Signed)
* [facility]     Assessment and Plan:  COPD group D with exacerbation requiring hospitalization. -Continue Combivent, Symbicort, nebulizer, albuterol metered-dose inhaler. - Continue to advance activity as tolerated.  - We will order nebulizer replacement. - PPSV23 today 08/01/2018.  Alcoholic cirrhosis, alcohol abuse. - Currently in remission.  CVA with unstable gait, deconditioning, weakness.  Nicotine abuse. -Discussed the importance of smoke cessation for 3 minutes, including setting a quit date, spent 3 minutes in discussion.  Return in about 1 year (around 08/02/2019).    Date: 07/31/2018  MRN# [id number] George Arellano 1965/09/18   George Arellano is a 53 y.o. old male seen in follow up for chief complaint of  Chief Complaint  Patient presents with  . COPD    Pt had {PFT done on 1/16.   Marland Kitchen Cough    pt needs order for nebulizer.  . Shortness of Breath     HPI:  The patient is a 53 year old male with a history of stroke, COPD.  At last visit he was asked to continue Combivent, Symbicort, albuterol.  He was asked to be more active and to stop smoking. He had had a sleep study performed by his neurologist which was apparently negative.  He is using duonebs 1-2 times per day but his machine broke. He is using symbicort 2 puffs twice per day. Combivent 2-3 times per day. Proair as needed.  He is smoking 1 ppd, he is not really serious about quitting at this time.   **PFT 07/27/2018>> tracings personally reviewed, FVC 64% predicted, FEV1 is 33% predicted, there is 21% improvement with bronchodilator.  Ratio is 41%.  TLC is 114% predicted, RV is 188% predicted the RV to TLC ratio significantly elevated 164%.  DLCO was 69% predicted.  Overall this shows severe COPD with reversibility and significant air trapping. **Desat walk 08/16/2017>>Desat walk at rest on RA 98% and HR 93; walked 314 feet at uncertain gait, conversational, minimal dyspnea, sat was 94% and HR  98.   Medication:    Current Outpatient Medications:  .  albuterol (PROVENTIL HFA;VENTOLIN HFA) 108 (90 BASE) MCG/ACT inhaler, Inhale 2 puffs into the lungs every 6 (six) hours as needed for wheezing or shortness of breath., Disp: , Rfl:  .  aspirin 325 MG tablet, Take 1 tablet (325 mg total) by mouth daily., Disp: 30 tablet, Rfl: 0 .  budesonide-formoterol (SYMBICORT) 160-4.5 MCG/ACT inhaler, Inhale 2 puffs into the lungs 2 (two) times daily., Disp: , Rfl:  .  chlordiazePOXIDE (LIBRIUM) 10 MG capsule, Take 1 capsule (10 mg total) by mouth 3 (three) times daily., Disp: 9 capsule, Rfl: 0 .  cholecalciferol (VITAMIN D) 1000 units tablet, Take 2,000 Units by mouth daily., Disp: , Rfl:  .  folic acid (FOLVITE) 1 MG tablet, Take 1 tablet (1 mg total) by mouth daily., Disp: 30 tablet, Rfl: 0 .  gabapentin (NEURONTIN) 300 MG capsule, Take 300 mg by mouth at bedtime., Disp: , Rfl:  .  Ipratropium-Albuterol (COMBIVENT RESPIMAT) 20-100 MCG/ACT AERS respimat, Inhale 2 puffs into the lungs every 6 (six) hours as needed for wheezing or shortness of breath., Disp: , Rfl:  .  ipratropium-albuterol (DUONEB) 0.5-2.5 (3) MG/3ML SOLN, Take 3 mLs by nebulization every 6 (six) hours as needed (for shortness of breath/wheezing)., Disp: , Rfl:  .  lamoTRIgine (LAMICTAL) 25 MG tablet, Take 25 mg by mouth 2 (two) times daily., Disp: , Rfl:  .  metoprolol tartrate (LOPRESSOR) 50 MG tablet, Take 50 mg by  mouth daily., Disp: , Rfl: 2 .  Multiple Vitamins-Minerals (MULTIVITAMIN WITH MINERALS) tablet, Take 1 tablet by mouth daily., Disp: , Rfl:  .  nicotine (NICODERM CQ - DOSED IN MG/24 HOURS) 14 mg/24hr patch, Place 1 patch (14 mg total) onto the skin daily., Disp: 28 patch, Rfl: 0 .  OXYGEN, Inhale 2 L into the lungs at bedtime., Disp: , Rfl:  .  thiamine 100 MG tablet, Take 1 tablet (100 mg total) by mouth daily., Disp: 30 tablet, Rfl: 0 .  vitamin B-12 (CYANOCOBALAMIN) 1000 MCG tablet, Take 1,000 mcg by mouth daily.,  Disp: , Rfl:    Allergies:  Atorvastatin and Levaquin [levofloxacin]  Review of Systems:  Constitutional: Feels well. Cardiovascular: Denies chest pain, exertional chest pain.  Pulmonary: Denies hemoptysis, pleuritic chest pain.   The remainder of systems were reviewed and were found to be negative other than what is documented in the HPI.    Physical Examination:   VS: BP 120/80 (BP Location: Left Arm, Cuff Size: Large)   Pulse 96   Resp 16   Ht 5\' 8"  (1.727 m)   Wt 180 lb (81.6 kg)   SpO2 97%   BMI 27.37 kg/m   General Appearance: No distress  Neuro:without focal findings, mental status, speech normal, alert and oriented HEENT: PERRLA, EOM intact Pulmonary: Scattered bilateral wheezing. CardiovascularNormal S1,S2.  No m/r/g.  Abdomen: Benign, Soft, non-tender, No masses Renal:  No costovertebral tenderness  GU:  No performed at this time. Endoc: No evident thyromegaly, no signs of acromegaly or Cushing features Skin:   warm, no rashes, no ecchymosis  Extremities: normal, no cyanosis, clubbing.    LABORATORY PANEL:   CBC No results for input(s): WBC, HGB, HCT, PLT in the last 168 hours. ------------------------------------------------------------------------------------------------------------------  Chemistries  No results for input(s): NA, K, CL, CO2, GLUCOSE, BUN, CREATININE, CALCIUM, MG, AST, ALT, ALKPHOS, BILITOT in the last 168 hours.  Invalid input(s): GFRCGP ------------------------------------------------------------------------------------------------------------------  Cardiac Enzymes No results for input(s): TROPONINI in the last 168 hours. ------------------------------------------------------------  RADIOLOGY:   No results found for this or any previous visit. Results for orders placed during the hospital encounter of 07/18/17  DG Chest 2 View   Narrative CLINICAL DATA:  Pneumonia  EXAM: CHEST  2 VIEW  COMPARISON:   07/26/2017  FINDINGS: Bibasilar airspace unchanged. Left upper lobe infiltrate unchanged. Small bilateral pleural effusions unchanged. Negative for heart failure.  IMPRESSION: Bilateral airspace disease unchanged, probable pneumonia   Electronically Signed   By: Franchot Gallo M.D.   On: 07/27/2017 09:51    ------------------------------------------------------------------------------------------------------------------  Thank  you for allowing [facility] to assist in the care of your patient. Our recommendations are noted above.  Please contact us if we can be of further service.   Marda Stalker, M.D., F.C.C.P.  Board Certified in Internal Medicine, Pulmonary Medicine, [place], and Sleep Medicine.  [facility] Office Number: [phone]  07/31/2018

## 2018-08-01 ENCOUNTER — Encounter: Payer: Self-pay | Admitting: Internal Medicine

## 2018-08-01 ENCOUNTER — Ambulatory Visit (INDEPENDENT_AMBULATORY_CARE_PROVIDER_SITE_OTHER): Payer: Medicaid Other | Admitting: Internal Medicine

## 2018-08-01 VITALS — BP 120/80 | HR 96 | Resp 16 | Ht 68.0 in | Wt 180.0 lb

## 2018-08-01 DIAGNOSIS — F17210 Nicotine dependence, cigarettes, uncomplicated: Secondary | ICD-10-CM

## 2018-08-01 DIAGNOSIS — Z720 Tobacco use: Secondary | ICD-10-CM

## 2018-08-01 DIAGNOSIS — Z23 Encounter for immunization: Secondary | ICD-10-CM | POA: Diagnosis not present

## 2018-08-01 DIAGNOSIS — J449 Chronic obstructive pulmonary disease, unspecified: Secondary | ICD-10-CM | POA: Diagnosis not present

## 2018-08-01 NOTE — Patient Instructions (Addendum)
--  Quitting smoking is the most important thing that you can do for your health.  --Quitting smoking will have greater affect on your health than any medicine that we can give you.   Will give Pneumovax-23 pneumonia shot today.

## 2018-08-08 ENCOUNTER — Emergency Department: Payer: Medicaid Other

## 2018-08-08 ENCOUNTER — Encounter: Payer: Self-pay | Admitting: Emergency Medicine

## 2018-08-08 ENCOUNTER — Other Ambulatory Visit: Payer: Self-pay

## 2018-08-08 ENCOUNTER — Emergency Department
Admission: EM | Admit: 2018-08-08 | Discharge: 2018-08-08 | Disposition: A | Discharge status: Home / Self Care | Payer: Medicaid Other | Attending: Emergency Medicine | Admitting: Emergency Medicine

## 2018-08-08 DIAGNOSIS — Z79899 Other long term (current) drug therapy: Secondary | ICD-10-CM | POA: Insufficient documentation

## 2018-08-08 DIAGNOSIS — F1010 Alcohol abuse, uncomplicated: Secondary | ICD-10-CM | POA: Diagnosis not present

## 2018-08-08 DIAGNOSIS — F419 Anxiety disorder, unspecified: Secondary | ICD-10-CM | POA: Diagnosis not present

## 2018-08-08 DIAGNOSIS — I110 Hypertensive heart disease with heart failure: Secondary | ICD-10-CM | POA: Insufficient documentation

## 2018-08-08 DIAGNOSIS — Z7982 Long term (current) use of aspirin: Secondary | ICD-10-CM | POA: Diagnosis not present

## 2018-08-08 DIAGNOSIS — I509 Heart failure, unspecified: Secondary | ICD-10-CM | POA: Insufficient documentation

## 2018-08-08 DIAGNOSIS — R569 Unspecified convulsions: Secondary | ICD-10-CM | POA: Diagnosis present

## 2018-08-08 DIAGNOSIS — G40909 Epilepsy, unspecified, not intractable, without status epilepticus: Secondary | ICD-10-CM | POA: Diagnosis not present

## 2018-08-08 DIAGNOSIS — Z8673 Personal history of transient ischemic attack (TIA), and cerebral infarction without residual deficits: Secondary | ICD-10-CM | POA: Diagnosis not present

## 2018-08-08 DIAGNOSIS — Z95818 Presence of other cardiac implants and grafts: Secondary | ICD-10-CM | POA: Insufficient documentation

## 2018-08-08 DIAGNOSIS — F17210 Nicotine dependence, cigarettes, uncomplicated: Secondary | ICD-10-CM | POA: Diagnosis not present

## 2018-08-08 DIAGNOSIS — J449 Chronic obstructive pulmonary disease, unspecified: Secondary | ICD-10-CM | POA: Diagnosis not present

## 2018-08-08 HISTORY — DX: Cerebral infarction, unspecified: I63.9

## 2018-08-08 HISTORY — DX: Alcohol abuse, uncomplicated: F10.10

## 2018-08-08 LAB — BASIC METABOLIC PANEL
ANION GAP: 14 (ref 5–15)
BUN: 8 mg/dL (ref 6–20)
CO2: 17 mmol/L — ABNORMAL LOW (ref 22–32)
Calcium: 8.7 mg/dL — ABNORMAL LOW (ref 8.9–10.3)
Chloride: 105 mmol/L (ref 98–111)
Creatinine, Ser: 1.13 mg/dL (ref 0.61–1.24)
GFR calc Af Amer: >60 mL/min (ref >60)
GLUCOSE: 105 mg/dL — AB (ref 70–99)
POTASSIUM: 3.8 mmol/L (ref 3.5–5.1)
Sodium: 136 mmol/L (ref 135–145)

## 2018-08-08 LAB — CBC WITH DIFFERENTIAL/PLATELET
Abs Immature Granulocytes: 0.13 10*3/uL — ABNORMAL HIGH (ref 0.00–0.07)
Basophils Absolute: 0.1 10*3/uL (ref 0.0–0.1)
Basophils Relative: 1 %
EOS ABS: 0.2 10*3/uL (ref 0.0–0.5)
Eosinophils Relative: 2 %
HCT: 43.8 % (ref 39.0–52.0)
Hemoglobin: 14.8 g/dL (ref 13.0–17.0)
IMMATURE GRANULOCYTES: 2 %
LYMPHS ABS: 1.6 10*3/uL (ref 0.7–4.0)
Lymphocytes Relative: 19 %
MCH: 34.3 pg — ABNORMAL HIGH (ref 26.0–34.0)
MCHC: 33.8 g/dL (ref 30.0–36.0)
MCV: 101.6 fL — ABNORMAL HIGH (ref 80.0–100.0)
Monocytes Absolute: 0.7 10*3/uL (ref 0.1–1.0)
Monocytes Relative: 9 %
Neutro Abs: 5.9 10*3/uL (ref 1.7–7.7)
Neutrophils Relative %: 67 %
Platelets: 171 10*3/uL (ref 150–400)
RBC: 4.31 MIL/uL (ref 4.22–5.81)
RDW: 11.4 % — ABNORMAL LOW (ref 11.5–15.5)
WBC: 8.6 10*3/uL (ref 4.0–10.5)
nRBC: 0 % (ref 0.0–0.2)

## 2018-08-08 LAB — ETHANOL: Alcohol, Ethyl (B): <10 mg/dL (ref <10)

## 2018-08-08 MED ORDER — LORAZEPAM 2 MG/ML IJ SOLN
1.0000 mg | Freq: Once | INTRAMUSCULAR | Status: AC
  Administered 2018-08-08: 1 mg via INTRAVENOUS
  Filled 2018-08-08: qty 1

## 2018-08-08 MED ORDER — SODIUM CHLORIDE 0.9 % IV BOLUS
1000.0000 mL | Freq: Once | INTRAVENOUS | Status: AC
  Administered 2018-08-08: 1000 mL via INTRAVENOUS

## 2018-08-08 MED ORDER — LAMOTRIGINE 100 MG PO TABS: 100.0000 mg | ORAL_TABLET | Freq: Two times a day (BID) | ORAL | 0 refills | Status: DC

## 2018-08-08 NOTE — ED Notes (Signed)
Patient transported to CT 

## 2018-08-08 NOTE — ED Notes (Signed)
Pt was place on 2L of O2 via [place]. SpO2 dropped to 89%. Pt is at 98% on 2L [place] at this time. NAD noted. This RN will continue to monitor pt.

## 2018-08-08 NOTE — ED Provider Notes (Signed)
[facility] Emergency Department Provider Note ____________________________________________   First MD Initiated Contact with Patient 08/08/18 1307     (approximate)  I have reviewed the triage vital signs and the nursing notes.   HISTORY  Chief Complaint Hypotension; Seizures; and Fall    HPI George Arellano is a 53 y.o. male with PMH as noted below including alcohol abuse, COPD, CHF, and stroke presents with what he describes as a seizure, acute onset right after he started drinking a beer, and described as convulsions.  The patient states that initially he started shaking and convulsing while he was alert, and then he lost consciousness.  He states the next thing he remembers is that the paramedics were there.  He denies any symptoms leading up to this and states he was feeling fine.  He reports that he drinks daily, as much as two 40 ounce malt liquors and some additional beer.  He denies any change in this recently and states he drank that amount yesterday.  He states his last seizure was in March of last year.  He states he is only on gabapentin for the seizures.  Initially there was some report from EMS that his left hand was more numb than normal, however the patient states that this is not the case and his neuro deficits on the left side from prior stroke are at baseline.  The patient denies headache or other acute pain.  He states that he feels relatively well right now.  He states he does not have any interest in quitting drinking.  Past Medical History:  Diagnosis Date  . Alcohol abuse   . Anxiety   . CHF (congestive heart failure) ([place])   . COPD (chronic obstructive pulmonary disease) ([place])   . Hypertension   . Stroke [facility])     Patient Active Problem List   Diagnosis Date Noted  . Nonintractable epilepsy without status epilepticus ([place]) 01/06/2018  . Arterial ischemic stroke, PCA (posterior cerebral artery), left, acute ([place]) 08/19/2017  .  Hemiparesis affecting nondominant side as late effect of stroke ([place]) 08/19/2017  . 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 Acute respiratory failure with hypoxemia ([place]) 07/18/2017  . Alcoholism ([place]) 08/16/2016  . Downbeat nystagmus 08/16/2016  . 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 Community acquired pneumonia 05/05/2015    Past Surgical History:  Procedure Laterality Date  . LOOP RECORDER INSERTION N/A 09/15/2017   Procedure: LOOP RECORDER INSERTION;  Surgeon: Isaias Cowman, MD;  Location: [facility];  Service: Cardiovascular;  Laterality: N/A;    Prior to Admission medications   Medication Sig Start Date End Date Taking? Authorizing Provider  albuterol (PROVENTIL HFA;VENTOLIN HFA) 108 (90 BASE) MCG/ACT inhaler Inhale 2 puffs into the lungs every 6 (six) hours as needed for wheezing or shortness of breath.    [provider]  aspirin 325 MG tablet Take 1 tablet (325 mg total) by mouth daily. 08/02/17   Max Sane, MD  budesonide-formoterol [facility]) 160-4.5 MCG/ACT inhaler Inhale 2 puffs into the lungs 2 (two) times daily.    [provider]  chlordiazePOXIDE (LIBRIUM) 10 MG capsule Take 1 capsule (10 mg total) by mouth 3 (three) times daily. 09/25/17   Carrie Mew, MD  cholecalciferol (VITAMIN D) 1000 units tablet Take 2,000 Units by mouth daily.    [provider]  folic acid (FOLVITE) 1 MG tablet Take 1 tablet (1 mg total) by mouth daily. 08/02/17   Max Sane, MD  gabapentin (NEURONTIN) 300 MG  capsule Take 300 mg by mouth at bedtime. 08/15/17 02/11/18  [provider]  Ipratropium-Albuterol (COMBIVENT RESPIMAT) 20-100 MCG/ACT AERS respimat Inhale 2 puffs into the lungs every 6 (six) hours as needed for wheezing or shortness of breath.    [provider]  ipratropium-albuterol (DUONEB) 0.5-2.5 (3) MG/3ML SOLN Take 3 mLs by nebulization every 6 (six) hours as needed (for shortness of breath/wheezing).    [provider]  lamoTRIgine (LAMICTAL) 100 MG tablet Take 1 tablet (100 mg total) by mouth 2 (two) times daily for 30 days. 08/08/18 09/07/18  Arta Silence, MD  metoprolol tartrate (LOPRESSOR) 50 MG tablet Take 50 mg by mouth daily. 12/07/17   [provider]  Multiple Vitamins-Minerals (MULTIVITAMIN WITH MINERALS) tablet Take 1 tablet by mouth daily.    [provider]  OXYGEN Inhale 2 L into the lungs at bedtime.    [provider]  thiamine 100 MG tablet Take 1 tablet (100 mg total) by mouth daily. 08/02/17   Max Sane, MD  vitamin B-12 (CYANOCOBALAMIN) 1000 MCG tablet Take 1,000 mcg by mouth daily.    [provider]    Allergies Atorvastatin and Levaquin [levofloxacin]  Family History  Problem Relation Age of Onset  . Hypertension Other     Social History Social History   Tobacco Use  . Smoking status: Current Every Day Smoker    Packs/day: 1.00    Types: Cigarettes  . Smokeless tobacco: Never Used  Substance Use Topics  . Alcohol use: Yes    Alcohol/week: 42.0 standard drinks    Types: 42 Cans of beer per week    Comment: "2 40 oz and a 24 oz" 08/08/2018  . Drug use: Yes    Types: Marijuana    Review of Systems  Constitutional: No fever. Eyes: No visual changes. ENT: No sore throat. Cardiovascular: Denies chest pain. Respiratory: Denies shortness of breath. Gastrointestinal: No vomiting or diarrhea.  Genitourinary: Negative for dysuria.  Musculoskeletal: Negative for back pain. Skin: Negative for rash. Neurological: Negative for headache.  Negative for new weakness or numbness.   ____________________________________________   PHYSICAL EXAM:  VITAL SIGNS: ED Triage Vitals [08/08/18 1306]  Enc Vitals Group     BP 120/73     Pulse Rate (!) 115     Resp 14     Temp 97.8 F (36.6 C)     Temp Source Oral     SpO2 98 %     Weight 180 lb (81.6 kg)     Height 5\' 8"  (1.727 m)     Head Circumference      Peak Flow      Pain  Score 0     Pain Loc      Pain Edu?      Excl. in [place]?     Constitutional: Alert and oriented.  Relatively well appearing and in no acute distress. Eyes: Conjunctivae are normal.  EOMI.  PERRLA. Head: Atraumatic. Nose: No congestion/rhinnorhea. Mouth/Throat: Mucous membranes are somewhat dry.   Neck: Normal range of motion.  Cardiovascular: Tachycardic, regular rhythm. Grossly normal heart sounds.  Good peripheral circulation. Respiratory: Normal respiratory effort.  No retractions. Lungs CTAB. Gastrointestinal: No distention.  Musculoskeletal: Extremities warm and well perfused.  Neurologic:  Normal speech and language.  Motor intact in all extremities.  Left upper extremity numbness, chronic for patient.  No facial droop.  Cranial nerves III through XII intact.  Normal coordination.   Skin:  Skin is warm and dry.  No rash noted. Psychiatric: Mood and affect are normal. Speech and behavior are normal.  ____________________________________________   LABS (all labs ordered are listed, but only abnormal results are displayed)  Labs Reviewed  BASIC METABOLIC PANEL - Abnormal; Notable for the following components:      Result Value   CO2 17 (*)    Glucose, Bld 105 (*)    Calcium 8.7 (*)    All other components within normal limits  CBC WITH DIFFERENTIAL/PLATELET - Abnormal; Notable for the following components:   MCV 101.6 (*)    MCH 34.3 (*)    RDW 11.4 (*)    Abs Immature Granulocytes 0.13 (*)    All other components within normal limits  ETHANOL   ____________________________________________  EKG  ED ECG REPORT I, Arta Silence, the attending physician, personally viewed and interpreted this ECG.  Date: 08/08/2018 EKG Time: 1305 Rate: 114 Rhythm: Sinus tachycardia QRS Axis: Borderline right axis Intervals: normal ST/T Wave abnormalities: normal Narrative Interpretation: no evidence of acute  ischemia  ____________________________________________  RADIOLOGY  CT head: No ICH or other acute findings  ____________________________________________   PROCEDURES  Procedure(s) performed: No  Procedures  Critical Care performed: No ____________________________________________   INITIAL IMPRESSION / ASSESSMENT AND PLAN / ED COURSE  Pertinent labs & imaging results that were available during my care of the patient were reviewed by me and considered in my medical decision making (see chart for details).  53 year old male with PMH as noted above including history of prior seizures as well as old stroke presents with an apparent seizure-like episode today.  The patient drinks daily and states that he drank his normal amount yesterday and was just starting to drink beer today when the episode happened.  He is now asymptomatic.  He has no new neuro symptoms.  On exam the patient is tachycardic but his other vital signs are normal.  He is overall relatively well-appearing.  His neuro exam is nonfocal except for left upper extremity sensory deficit, but motor is intact.  EKG shows no acute findings.  Overall the presentation is most consistent with a seizure although there is some aspects that are atypical including the fact that the patient was awake initially.  However he states that this is similar to prior seizures, and he did end up losing consciousness this time.  There is no evidence of acute stroke.  I also do not suspect that the patient is in any significant alcohol withdrawal given that he did not change his alcohol intake yesterday, and was drinking alcohol today when this happened.  We will obtain a CT head due to the patient's stroke and other neuro history, lab work-up, give fluids and a dose of Ativan, and reassess.  ----------------------------------------- 2:51 PM on 08/08/2018 -----------------------------------------  On reassessment, the patient appears  comfortable.  His heart rate is down to the 90s.  His other vital signs remained stable.  CT head shows no acute findings, and the lab work-up is unremarkable.    On further history the patient states that he is also on Lamictal for seizures.  He takes 50 mg twice daily.  The patient is adamant that he is compliant with it.  Since the Lamictal level would be a send out and would not come back right away, there is no indication to check it here in the ED.  Since this is a fairly low dose, I think it would be reasonable to increase to the next increment (100 mg twice daily) and  have the patient follow-up with his neurologist Dr. Brigitte Pulse.  At this time there is no evidence of active alcohol withdrawal.  The patient has no tremor and his vital signs are stable.  I did discuss with him that if he wanted to be admitted for detox this would be possible, however the patient states that he does want to quit drinking at this time.  He feels comfortable going home with the increased Lamictal dose and following up with his doctor.  I gave the patient thorough return precautions and advised him specifically to return if he had any recurrent seizures as in this case he might need further inpatient monitoring and medication adjustment.  He agrees with this and expressed understanding.  He is stable for discharge at this time. ____________________________________________   FINAL CLINICAL IMPRESSION(S) / ED DIAGNOSES  Final diagnoses:  Seizure disorder ([place])      NEW MEDICATIONS STARTED DURING THIS VISIT:  New Prescriptions   LAMOTRIGINE (LAMICTAL) 100 MG TABLET    Take 1 tablet (100 mg total) by mouth 2 (two) times daily for 30 days.     Note:  This document was prepared using Dragon voice recognition software and may include unintentional dictation errors.    Arta Silence, MD 08/08/18 1454

## 2018-08-08 NOTE — ED Triage Notes (Signed)
Pt presents to ED via [facility] from home with c/o hypotension, seizure, and fall. Pt states hx of stroke with L sided deficits, states this morning felt like his hand was "more numb than normal". Pt states fell and "got to shaking real bad" for approx several minutes, states "I woke up and the paramedics were here". Pt is alert and oriented x 4 at this time. Pt states symptoms started approx "2 sips into my beer".

## 2018-08-08 NOTE — Discharge Instructions (Addendum)
You should increase the dose of your Lamictal to 100 mg twice daily.    Make an appointment to follow-up with Dr. Manuella Ghazi as soon as possible.  Return to the ER for new, worsening, or recurrent symptoms including recurrent seizures, weakness, tremors, severe headache, or any other new or worsening symptoms that concern you.

## 2018-08-23 ENCOUNTER — Emergency Department: Payer: Medicaid Other

## 2018-08-23 ENCOUNTER — Inpatient Hospital Stay
Admission: EM | Admit: 2018-08-23 | Discharge: 2018-08-31 | DRG: 418 | Disposition: A | Discharge status: Home / Self Care | Payer: Medicaid Other | Attending: Surgery | Admitting: Surgery

## 2018-08-23 ENCOUNTER — Encounter: Payer: Self-pay | Admitting: Emergency Medicine

## 2018-08-23 ENCOUNTER — Other Ambulatory Visit: Payer: Self-pay

## 2018-08-23 DIAGNOSIS — Z8669 Personal history of other diseases of the nervous system and sense organs: Secondary | ICD-10-CM

## 2018-08-23 DIAGNOSIS — I69354 Hemiplegia and hemiparesis following cerebral infarction affecting left non-dominant side: Secondary | ICD-10-CM

## 2018-08-23 DIAGNOSIS — F10239 Alcohol dependence with withdrawal, unspecified: Secondary | ICD-10-CM | POA: Diagnosis present

## 2018-08-23 DIAGNOSIS — I509 Heart failure, unspecified: Secondary | ICD-10-CM | POA: Diagnosis present

## 2018-08-23 DIAGNOSIS — Z9981 Dependence on supplemental oxygen: Secondary | ICD-10-CM

## 2018-08-23 DIAGNOSIS — K8020 Calculus of gallbladder without cholecystitis without obstruction: Secondary | ICD-10-CM | POA: Diagnosis not present

## 2018-08-23 DIAGNOSIS — Z881 Allergy status to other antibiotic agents status: Secondary | ICD-10-CM

## 2018-08-23 DIAGNOSIS — K812 Acute cholecystitis with chronic cholecystitis: Secondary | ICD-10-CM

## 2018-08-23 DIAGNOSIS — I110 Hypertensive heart disease with heart failure: Secondary | ICD-10-CM | POA: Diagnosis present

## 2018-08-23 DIAGNOSIS — K429 Umbilical hernia without obstruction or gangrene: Secondary | ICD-10-CM

## 2018-08-23 DIAGNOSIS — G8929 Other chronic pain: Secondary | ICD-10-CM | POA: Diagnosis present

## 2018-08-23 DIAGNOSIS — F17210 Nicotine dependence, cigarettes, uncomplicated: Secondary | ICD-10-CM | POA: Diagnosis present

## 2018-08-23 DIAGNOSIS — Z8249 Family history of ischemic heart disease and other diseases of the circulatory system: Secondary | ICD-10-CM

## 2018-08-23 DIAGNOSIS — Z7982 Long term (current) use of aspirin: Secondary | ICD-10-CM

## 2018-08-23 DIAGNOSIS — Z79899 Other long term (current) drug therapy: Secondary | ICD-10-CM

## 2018-08-23 DIAGNOSIS — R1011 Right upper quadrant pain: Secondary | ICD-10-CM

## 2018-08-23 DIAGNOSIS — I482 Chronic atrial fibrillation, unspecified: Secondary | ICD-10-CM | POA: Diagnosis present

## 2018-08-23 DIAGNOSIS — F419 Anxiety disorder, unspecified: Secondary | ICD-10-CM | POA: Diagnosis present

## 2018-08-23 DIAGNOSIS — J441 Chronic obstructive pulmonary disease with (acute) exacerbation: Secondary | ICD-10-CM | POA: Diagnosis present

## 2018-08-23 DIAGNOSIS — T380X5A Adverse effect of glucocorticoids and synthetic analogues, initial encounter: Secondary | ICD-10-CM | POA: Diagnosis not present

## 2018-08-23 DIAGNOSIS — Z888 Allergy status to other drugs, medicaments and biological substances status: Secondary | ICD-10-CM

## 2018-08-23 DIAGNOSIS — K760 Fatty (change of) liver, not elsewhere classified: Secondary | ICD-10-CM | POA: Diagnosis present

## 2018-08-23 HISTORY — DX: Unspecified convulsions: R56.9

## 2018-08-23 LAB — HEPATIC FUNCTION PANEL
ALK PHOS: 87 U/L (ref 38–126)
ALT: 60 U/L — ABNORMAL HIGH (ref 0–44)
AST: 103 U/L — ABNORMAL HIGH (ref 15–41)
Albumin: 4.6 g/dL (ref 3.5–5.0)
BILIRUBIN TOTAL: 1.6 mg/dL — AB (ref 0.3–1.2)
Bilirubin, Direct: 0.4 mg/dL — ABNORMAL HIGH (ref 0.0–0.2)
Indirect Bilirubin: 1.2 mg/dL — ABNORMAL HIGH (ref 0.3–0.9)
Total Protein: 8.4 g/dL — ABNORMAL HIGH (ref 6.5–8.1)

## 2018-08-23 LAB — URINALYSIS, COMPLETE (UACMP) WITH MICROSCOPIC
Bacteria, UA: NONE SEEN
Bilirubin Urine: NEGATIVE
Glucose, UA: 50 mg/dL — AB
Hgb urine dipstick: NEGATIVE
Ketones, ur: 20 mg/dL — AB
Leukocytes,Ua: NEGATIVE
Nitrite: NEGATIVE
Protein, ur: NEGATIVE mg/dL
Specific Gravity, Urine: 1.01 (ref 1.005–1.030)
Squamous Epithelial / HPF: NONE SEEN (ref 0–5)
pH: 7 (ref 5.0–8.0)

## 2018-08-23 LAB — CBC
HCT: 47.6 % (ref 39.0–52.0)
Hemoglobin: 16.7 g/dL (ref 13.0–17.0)
MCH: 35.1 pg — ABNORMAL HIGH (ref 26.0–34.0)
MCHC: 35.1 g/dL (ref 30.0–36.0)
MCV: 100 fL (ref 80.0–100.0)
Platelets: 168 10*3/uL (ref 150–400)
RBC: 4.76 MIL/uL (ref 4.22–5.81)
RDW: 11.5 % (ref 11.5–15.5)
WBC: 12.9 10*3/uL — ABNORMAL HIGH (ref 4.0–10.5)
nRBC: 0 % (ref 0.0–0.2)

## 2018-08-23 LAB — BASIC METABOLIC PANEL
Anion gap: 13 (ref 5–15)
BUN: 7 mg/dL (ref 6–20)
CO2: 29 mmol/L (ref 22–32)
Calcium: 10.1 mg/dL (ref 8.9–10.3)
Chloride: 101 mmol/L (ref 98–111)
Creatinine, Ser: 0.8 mg/dL (ref 0.61–1.24)
GFR calc Af Amer: >60 mL/min (ref >60)
GFR calc non Af Amer: >60 mL/min (ref >60)
Glucose, Bld: 125 mg/dL — ABNORMAL HIGH (ref 70–99)
Potassium: 4.4 mmol/L (ref 3.5–5.1)
SODIUM: 143 mmol/L (ref 135–145)

## 2018-08-23 LAB — LIPASE, BLOOD: Lipase: 28 U/L (ref 11–51)

## 2018-08-23 MED ORDER — LORAZEPAM 2 MG/ML IJ SOLN
1.0000 mg | Freq: Four times a day (QID) | INTRAMUSCULAR | Status: AC | PRN
  Administered 2018-08-24 (×2): 1 mg via INTRAVENOUS
  Filled 2018-08-23 (×2): qty 1

## 2018-08-23 MED ORDER — MULTI-VITAMIN/MINERALS PO TABS: 1.0000 | ORAL_TABLET | Freq: Every day | ORAL | Status: DC

## 2018-08-23 MED ORDER — IPRATROPIUM-ALBUTEROL 0.5-2.5 (3) MG/3ML IN SOLN: 3.0000 mL | Freq: Four times a day (QID) | RESPIRATORY_TRACT | Status: DC | PRN

## 2018-08-23 MED ORDER — MORPHINE SULFATE (PF) 4 MG/ML IV SOLN: 4.0000 mg | Freq: Once | INTRAVENOUS | Status: DC

## 2018-08-23 MED ORDER — GABAPENTIN 300 MG PO CAPS
300.0000 mg | ORAL_CAPSULE | Freq: Two times a day (BID) | ORAL | Status: DC
  Administered 2018-08-23 – 2018-08-31 (×13): 300 mg via ORAL
  Filled 2018-08-23 (×14): qty 1

## 2018-08-23 MED ORDER — KETOROLAC TROMETHAMINE 30 MG/ML IJ SOLN
30.0000 mg | Freq: Once | INTRAMUSCULAR | Status: AC
  Administered 2018-08-23: 30 mg via INTRAVENOUS
  Filled 2018-08-23: qty 1

## 2018-08-23 MED ORDER — ADULT MULTIVITAMIN W/MINERALS CH
1.0000 | ORAL_TABLET | Freq: Every day | ORAL | Status: DC
  Administered 2018-08-24 – 2018-08-31 (×6): 1 via ORAL
  Filled 2018-08-23 (×6): qty 1

## 2018-08-23 MED ORDER — VITAMIN B-1 100 MG PO TABS
100.0000 mg | ORAL_TABLET | Freq: Every day | ORAL | Status: DC
  Administered 2018-08-24 – 2018-08-31 (×6): 100 mg via ORAL
  Filled 2018-08-23 (×6): qty 1

## 2018-08-23 MED ORDER — METOPROLOL TARTRATE 5 MG/5ML IV SOLN
5.0000 mg | Freq: Once | INTRAVENOUS | Status: AC
  Administered 2018-08-23: 5 mg via INTRAVENOUS
  Filled 2018-08-23: qty 5

## 2018-08-23 MED ORDER — MORPHINE SULFATE (PF) 4 MG/ML IV SOLN
4.0000 mg | Freq: Once | INTRAVENOUS | Status: AC
  Administered 2018-08-23: 4 mg via INTRAVENOUS
  Filled 2018-08-23: qty 1

## 2018-08-23 MED ORDER — METOPROLOL TARTRATE 50 MG PO TABS
50.0000 mg | ORAL_TABLET | Freq: Two times a day (BID) | ORAL | Status: DC
  Administered 2018-08-23 – 2018-08-31 (×13): 50 mg via ORAL
  Filled 2018-08-23 (×15): qty 1

## 2018-08-23 MED ORDER — FENTANYL CITRATE (PF) 100 MCG/2ML IJ SOLN
50.0000 ug | Freq: Once | INTRAMUSCULAR | Status: AC
  Administered 2018-08-23: 50 ug via INTRAVENOUS
  Filled 2018-08-23: qty 2

## 2018-08-23 MED ORDER — THIAMINE HCL 100 MG/ML IJ SOLN: 100.0000 mg | Freq: Every day | INTRAMUSCULAR | Status: DC

## 2018-08-23 MED ORDER — LISINOPRIL 5 MG PO TABS
2.5000 mg | ORAL_TABLET | Freq: Every day | ORAL | Status: DC
  Administered 2018-08-24: 2.5 mg via ORAL
  Filled 2018-08-23 (×2): qty 0.5

## 2018-08-23 MED ORDER — MOMETASONE FURO-FORMOTEROL FUM 200-5 MCG/ACT IN AERO
2.0000 | INHALATION_SPRAY | Freq: Two times a day (BID) | RESPIRATORY_TRACT | Status: DC
  Administered 2018-08-23 – 2018-08-31 (×14): 2 via RESPIRATORY_TRACT
  Filled 2018-08-23: qty 8.8

## 2018-08-23 MED ORDER — LORAZEPAM 1 MG PO TABS
1.0000 mg | ORAL_TABLET | Freq: Four times a day (QID) | ORAL | Status: AC | PRN
  Administered 2018-08-23 – 2018-08-25 (×3): 1 mg via ORAL
  Filled 2018-08-23 (×3): qty 1

## 2018-08-23 MED ORDER — IPRATROPIUM-ALBUTEROL 0.5-2.5 (3) MG/3ML IN SOLN
3.0000 mL | Freq: Four times a day (QID) | RESPIRATORY_TRACT | Status: DC | PRN
  Administered 2018-08-30: 3 mL via RESPIRATORY_TRACT
  Filled 2018-08-23: qty 3

## 2018-08-23 MED ORDER — FOLIC ACID 1 MG PO TABS
1.0000 mg | ORAL_TABLET | Freq: Every day | ORAL | Status: DC
  Administered 2018-08-24 – 2018-08-31 (×6): 1 mg via ORAL
  Filled 2018-08-23 (×6): qty 1

## 2018-08-23 MED ORDER — VITAMIN B-12 1000 MCG PO TABS
1000.0000 ug | ORAL_TABLET | Freq: Every day | ORAL | Status: DC
  Administered 2018-08-24 – 2018-08-31 (×6): 1000 ug via ORAL
  Filled 2018-08-23 (×6): qty 1

## 2018-08-23 MED ORDER — LAMOTRIGINE 100 MG PO TABS
100.0000 mg | ORAL_TABLET | Freq: Two times a day (BID) | ORAL | Status: DC
  Administered 2018-08-23 – 2018-08-31 (×14): 100 mg via ORAL
  Filled 2018-08-23 (×14): qty 1

## 2018-08-23 MED ORDER — SODIUM CHLORIDE 0.9 % IV BOLUS
1000.0000 mL | Freq: Once | INTRAVENOUS | Status: AC
  Administered 2018-08-23: 1000 mL via INTRAVENOUS

## 2018-08-23 MED ORDER — ALBUTEROL SULFATE (2.5 MG/3ML) 0.083% IN NEBU
2.5000 mg | INHALATION_SOLUTION | Freq: Four times a day (QID) | RESPIRATORY_TRACT | Status: DC | PRN
  Administered 2018-08-29: 2.5 mg via RESPIRATORY_TRACT
  Filled 2018-08-23: qty 3

## 2018-08-23 MED ORDER — ONDANSETRON HCL 4 MG/2ML IJ SOLN
4.0000 mg | Freq: Once | INTRAMUSCULAR | Status: AC
  Administered 2018-08-23: 4 mg via INTRAVENOUS
  Filled 2018-08-23: qty 2

## 2018-08-23 NOTE — ED Notes (Signed)
Patient off unit to 206

## 2018-08-23 NOTE — ED Notes (Signed)
rreport given to receiving nurse Richardson Landry RN

## 2018-08-23 NOTE — ED Notes (Signed)
Labs drawn by Linus Orn, EDT.  [place] sent to lab, urine cup given to patient, unable to urinate at this time.

## 2018-08-23 NOTE — Progress Notes (Addendum)
Contacted by ED physician, Dr. Mariea Clonts, to inform that nuclear medicine is unable to perform test within 6 hours of morphine administration due to risk for false positive results, after which the radioisotope utilized for HIDA scan will have expired. HIDA will accordingly not be available until tomorrow. This was confirmed and discussed with radiologist, who also advises NPO >24 - 48 hours also increases risk of false positive HIDA results. Patient was reassessed and now reports complete resolution of RUQ abdominal pain. Patient acknowledges that he becomes irritable and anxious if he does not consume alcohol for an extended period time. No signs of sepsis at this time.   - will admit to surgical service overnight  - follow up / trend CBC and CMP tomorrow morning  - HIDA to be performed tomorrow pending radioisotope availability  - to reduce risk of false positive HIDA results and for patient comfort, will order clear liquids diet until midnight, NPO after midnight  - will order CIWA protocol considering patient's high risk for alcohol withdrawal  - no narcotics or antibiotics to avoid confounding exam  - please call if any questions or concerns  All of the above were discussed with patient, his mom, and ED physician, who agree with plan as described above.  -- George Drivers Rosana Hoes, MD, Junior: [facility] - Partnering for exceptional care. Office: [phone]

## 2018-08-23 NOTE — ED Notes (Signed)
Call received from Radiology unable to perform scan due to patient having pain meds at or about 3pm today. Reports scan has to be done with patient having 6 hours of no pain meds on board. Md on phone to discuss this concern.

## 2018-08-23 NOTE — Consult Note (Addendum)
SURGICAL CONSULTATION NOTE (initial)  Patient seen and examined as described below with surgical PA-C, Ardell Isaacs.  Assessment/Plan: (ICD-10's: K80.20 +/- K70.31 vs K80.01) In summary, patient is a 53 y.o. male with nearly resolved RUQ abdominal pain (persistent with morphine, relieved with Toradol and fentanyl x1), hyperbilirubinemia, and mild leukocytosis, suggestive of symptomatic cholelithiasis +/- alcohol-associated cirrhosis vs acute cholecystitis, complicated by comorbidities including severe chronic alcohol abuse/dependence, HTN, symptomatic COPD (not yet on home supplemental oxygen), CHF, Left hemiparesis s/p stroke, chronic atrial fibrillation on full dose ASA without therapeutic anticoagulation, seizure disorder, generalized anxiety disorder, and chronic ongoing tobacco abuse (smoking).   - NPO for now  - avoid morphine and similar (Toradol, fentanyl given for pain, since nearly resolved)  - will check HIDA scan (w/o gallbladder EF) to help differentiate cholecystitis from cholelithiasis with cirrhosis  - discussed with patient natural history/risks of cholelithiasis +/- cholecystitis and management options in context of his comorbidities  - further management and disposition recommendations pending HIDA results  I have personally reviewed the patient's chart, evaluated/examined the patient, proposed the recommended management, and discussed these recommendations with the patient and his mom to their expressed satisfaction as well as with patient's ED physician.  Thank you for the opportunity to participate in this patient's care.  -- Marilynne Drivers Rosana Hoes, MD, [place]: [facility] - Partnering for exceptional care. Office: [phone]  [facility] SURGICAL CONSULTATION NOTE (initial)   HISTORY OF PRESENT ILLNESS (HPI):  53 y.o. male presented to [facility] ED today for evaluation of abdominal pain. Patient reports that around  4 AM he woke up as he typically does and made taquitos and coffee for breakfast. After eating this, he noticed the acute onset of epigastric and RUQ sharp abdominal pain. He endorsed associated nausea and multiple episodes of non-bloody, non-bilious emesis. He says the pain did not improve after multiple doses of morphine, but has nearly resolved following recommended Toradol and fentanyl. He also describes multiple prior less severe episodes (including one 2 weeks ago after eating pork chops) and a few episodes for which he presented to ED and was discharged home. He denies fevers/chills, change in BM's, or CP, describes SOB unchanged from baseline, which requires him to lean on mailbox to catch his breath after walking from his house to pick up his mail. He says he otherwise sits at home and drinks alcohol, smokes tobacco and marijuana. He specifies he no longer drinks 3 - 4 twelve-packs of beer per day and "only" now drinks "two 40 oz and one 24 oz beers" per day. He is unable to say whether he could walk up a flight of steps without CP or SOB as he avoids doing so due to Left-sided weakness s/p stroke.  Surgery is consulted by emergency medicine physician Dr. Harvest Dark, MD in this context for evaluation and management of cholelithiasis with possible cholecystitis.  PAST MEDICAL HISTORY (PMH):  Past Medical History:  Diagnosis Date  . Alcohol abuse   . Anxiety   . CHF (congestive heart failure) ([place])   . COPD (chronic obstructive pulmonary disease) ([place])   . Hypertension   . Seizures ([place])   . Stroke [facility])     PAST SURGICAL HISTORY ([place]):  Past Surgical History:  Procedure Laterality Date  . LOOP RECORDER INSERTION N/A 09/15/2017   Procedure: LOOP RECORDER INSERTION;  Surgeon: Isaias Cowman, MD;  Location: [facility];  Service: Cardiovascular;  Laterality: N/A;    MEDICATIONS:  Prior  to Admission medications   Medication Sig Start Date End Date Taking? Authorizing  Provider  albuterol (PROVENTIL HFA;VENTOLIN HFA) 108 (90 BASE) MCG/ACT inhaler Inhale 2 puffs into the lungs every 6 (six) hours as needed for wheezing or shortness of breath.   Yes [provider]  aspirin 325 MG tablet Take 1 tablet (325 mg total) by mouth daily. 08/02/17  Yes Max Sane, MD  budesonide-formoterol (SYMBICORT) 160-4.5 MCG/ACT inhaler Inhale 2 puffs into the lungs 2 (two) times daily.   Yes [provider]  cholecalciferol (VITAMIN D) 1000 units tablet Take 2,000 Units by mouth daily.   Yes [provider]  gabapentin (NEURONTIN) 300 MG capsule Take 300 mg by mouth 2 (two) times daily.  08/15/17 08/23/18 Yes [provider]  Ipratropium-Albuterol (COMBIVENT RESPIMAT) 20-100 MCG/ACT AERS respimat Inhale 2 puffs into the lungs every 6 (six) hours as needed for wheezing or shortness of breath.   Yes [provider]  ipratropium-albuterol (DUONEB) 0.5-2.5 (3) MG/3ML SOLN Take 3 mLs by nebulization every 6 (six) hours as needed (for shortness of breath/wheezing).   Yes [provider]  lamoTRIgine (LAMICTAL) 100 MG tablet Take 1 tablet (100 mg total) by mouth 2 (two) times daily for 30 days. 08/08/18 09/07/18 Yes Arta Silence, MD  lisinopril (PRINIVIL,ZESTRIL) 2.5 MG tablet Take 2.5 mg by mouth daily. 08/10/18  Yes [provider]  metoprolol tartrate (LOPRESSOR) 50 MG tablet Take 50 mg by mouth 2 (two) times daily.  12/07/17  Yes [provider]  Multiple Vitamins-Minerals (MULTIVITAMIN WITH MINERALS) tablet Take 1 tablet by mouth daily.   Yes [provider]  vitamin B-12 (CYANOCOBALAMIN) 1000 MCG tablet Take 1,000 mcg by mouth daily.   Yes [provider]     ALLERGIES:  Allergies  Allergen Reactions  . Atorvastatin Hives and Itching    All over trunk of body  . Levaquin [Levofloxacin] Hives and Other (See Comments)    Rash, itching all over trunk of body    SOCIAL HISTORY:  Social History    Socioeconomic History  . Marital status: Single    Spouse name: Not on file  . Number of children: Not on file  . Years of education: Not on file  . Highest education level: Not on file  Occupational History  . Not on file  Social Needs  . Financial resource strain: Hard  . Food insecurity:    Worry: Sometimes true    Inability: Sometimes true  . Transportation needs:    Medical: No    Non-medical: No  Tobacco Use  . Smoking status: Current Every Day Smoker    Packs/day: 1.00    Types: Cigarettes  . Smokeless tobacco: Never Used  Substance and Sexual Activity  . Alcohol use: Yes    Alcohol/week: 42.0 standard drinks    Types: 42 Cans of beer per week    Comment: "2 40 oz and a 24 oz" 08/08/2018  . Drug use: Yes    Types: Marijuana  . Sexual activity: Not Currently  Lifestyle  . Physical activity:    Days per week: 0 days    Minutes per session: 0 min  . Stress: Patient refused  Relationships  . Social connections:    Talks on phone: Patient refused    Gets together: Patient refused    Attends religious service: Patient refused    Active member of club or organization: Patient refused    Attends meetings of clubs or organizations: Patient refused  Relationship status: Patient refused  . Intimate partner violence:    Fear of current or ex partner: Patient refused    Emotionally abused: Patient refused    Physically abused: Patient refused    Forced sexual activity: Patient refused  Other Topics Concern  . Not on file  Social History Narrative  . Not on file    FAMILY HISTORY:  Family History  Problem Relation Age of Onset  . Hypertension Other     REVIEW OF SYSTEMS:  Review of Systems  Constitutional: Negative for chills and fever.  Respiratory: Negative for cough and shortness of breath.   Cardiovascular: Negative for chest pain and palpitations.  Gastrointestinal: Positive for abdominal pain, nausea and vomiting. Negative for blood in stool,  constipation and diarrhea.  Genitourinary: Negative for dysuria, hematuria and urgency.  Musculoskeletal: Negative for myalgias and neck pain.  Neurological: Negative for dizziness and headaches.  All other systems reviewed and are negative.  VITAL SIGNS:  Temp:  [98.4 F (36.9 C)] 98.4 F (36.9 C) (02/12 1201) Pulse Rate:  [117-131] 117 (02/12 1430) Resp:  [16-18] 16 (02/12 1430) BP: (166-221)/(111-117) 221/117 (02/12 1430) SpO2:  [93 %-97 %] 93 % (02/12 1430) Weight:  [83.9 kg] 83.9 kg (02/12 1203)     Height: 5\' 9"  (175.3 cm) Weight: 83.9 kg BMI (Calculated): 27.31   INTAKE/OUTPUT:  This shift: Total I/O In: 1000 [IV Piggyback:1000] Out: -   Last 2 shifts: @IOLAST2SHIFTS @   PHYSICAL EXAM:  Physical Exam Vitals signs and nursing note reviewed.  Constitutional:      General: He is not in acute distress.    Appearance: He is well-developed. He is obese. He is not ill-appearing.  HENT:     Head: Normocephalic and atraumatic.  Eyes:     General: No scleral icterus.    Extraocular Movements: Extraocular movements intact.  Cardiovascular:     Rate and Rhythm: Regular rhythm. Tachycardia present.     Heart sounds: Normal heart sounds. No murmur. No friction rub. No gallop.   Pulmonary:     Effort: Pulmonary effort is normal. No respiratory distress.     Breath sounds: Wheezing (Faint, bilaterally) present. No rhonchi.  Abdominal:     General: Abdomen is protuberant. There is no distension.     Palpations: Abdomen is soft.     Tenderness: There is abdominal tenderness in the right upper quadrant. There is no guarding or rebound. Negative signs include Murphy's sign.  Genitourinary:    Comments: Deferred Skin:    General: Skin is warm and dry.     Coloration: Skin is not cyanotic or jaundiced.  Neurological:     General: No focal deficit present.     Mental Status: He is alert.  Psychiatric:        Mood and Affect: Mood normal.        Behavior: Behavior normal.   Labs:   CBC Latest Ref Rng & Units 08/23/2018 08/08/2018 03/03/2018  WBC 4.0 - 10.5 K/uL 12.9(H) 8.6 8.0  Hemoglobin 13.0 - 17.0 g/dL 16.7 14.8 16.7  Hematocrit 39.0 - 52.0 % 47.6 43.8 45.9  Platelets 150 - 400 K/uL 168 171 199   CMP Latest Ref Rng & Units 08/23/2018 08/08/2018 03/03/2018  Glucose 70 - 99 mg/dL 125(H) 105(H) 96  BUN 6 - 20 mg/dL 7 8 9   Creatinine 0.61 - 1.24 mg/dL 0.80 1.13 0.79  Sodium 135 - 145 mmol/L 143 136 137  Potassium 3.5 - 5.1 mmol/L 4.4 3.8 4.3  Chloride 98 - 111 mmol/L 101 105 101  CO2 22 - 32 mmol/L 29 17(L) 27  Calcium 8.9 - 10.3 mg/dL 10.1 8.7(L) 10.0  Total Protein 6.5 - 8.1 g/dL 8.4(H) - -  Total Bilirubin 0.3 - 1.2 mg/dL 1.6(H) - -  Alkaline Phos 38 - 126 U/L 87 - -  AST 15 - 41 U/L 103(H) - -  ALT 0 - 44 U/L 60(H) - -   Imaging studies:  RUQ [place] (08/23/2018):  1) Cholelithiasis with trace free pericholecystic fluid and positive sonographic Murphy sign. Clinical correlation for possible acute cholecystitis recommended. 2) No biliary dilatation. 3) Hepatic steatosis.  Assessment/Plan: (ICD-10's: K73.20) 53 y.o. male with nearly resolved RUQ abdominal pain (persistent with morphine, relieved with Toradol and fentanyl x1), hyperbilirubinemia, and mild leukocytosis, suggestive of symptomatic cholelithiasis +/- alcohol-associated cirrhosis vs acute cholecystitis, complicated by comorbidities including severe chronic alcohol abuse/dependence, HTN, symptomatic COPD (not yet on home supplemental oxygen), CHF, Left hemiparesis s/p stroke, chronic atrial fibrillation on full dose ASA without therapeutic anticoagulation, seizure disorder, generalized anxiety disorder, and chronic ongoing tobacco abuse (smoking).   - Remain NPO, IVF  - Recommend obtaining HIDA scan w/o gallbladder EF to differentiate cholecystitis from cholelithiasis with cirrhosis    - Discussed with patient natural history/risks of cholelithiasis +/- cholecystitis and management options in context of his  comorbidities  - Further management and disposition recommendations pending HIDA results  - Monitor abdominal examination, vital signs  All of the above findings and recommendations were discussed with the patient and his mom, and all of patient's and his family's questions were answered to their expressed satisfaction.  Thank you for the opportunity to participate in this patient's care.   -- Edison Simon, PA-C [facility] 08/23/2018, 3:35 PM [phone] M-F: 7am - 4pm

## 2018-08-23 NOTE — ED Notes (Signed)
Urine obtained and sent to lab  

## 2018-08-23 NOTE — ED Notes (Signed)
Patient returned from xray, moved into room 2 and placed on cardiac monitor. Patient hypertensive reports not taking any of his bp meds prior to ed arrival. Pain meds given as ordered will monitor vss.

## 2018-08-23 NOTE — ED Notes (Signed)
Off unit to u/s

## 2018-08-23 NOTE — ED Notes (Signed)
Awaiting surgery consult.

## 2018-08-23 NOTE — ED Provider Notes (Signed)
[facility] Emergency Department Provider Note  Time seen: 1:00 PM  I have reviewed the triage vital signs and the nursing notes.   HISTORY  Chief Complaint Abdominal Pain    HPI George Arellano is a 53 y.o. male with a past medical history of alcohol abuse, anxiety, COPD, hypertension, presents to the emergency department for upper abdominal pain.  According to the patient at 5 AM this morning he was eating taquitos.  Shortly after eating developed upper abdominal pain mostly in the right upper quadrant.  Patient states several days ago after eating pork he developed right upper quadrant pain as well.  Describes the pain as moderate, aching pain in the right upper quadrant.  States nausea.  Denies any diarrhea.  Denies dysuria.  No fever.  Patient is a daily alcohol drinker, states he drank half a beer this morning.   Past Medical History:  Diagnosis Date  . Alcohol abuse   . Anxiety   . CHF (congestive heart failure) ([place])   . COPD (chronic obstructive pulmonary disease) ([place])   . Hypertension   . Seizures ([place])   . Stroke [facility])     Patient Active Problem List   Diagnosis Date Noted  . Nonintractable epilepsy without status epilepticus ([place]) 01/06/2018  . Arterial ischemic stroke, PCA (posterior cerebral artery), left, acute ([place]) 08/19/2017  . Hemiparesis affecting nondominant side as late effect of stroke ([place]) 08/19/2017  . 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 Acute respiratory failure with hypoxemia ([place]) 07/18/2017  . Alcoholism ([place]) 08/16/2016  . Downbeat nystagmus 08/16/2016  . 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 Community acquired pneumonia 05/05/2015    Past Surgical History:  Procedure Laterality Date  . LOOP RECORDER INSERTION N/A 09/15/2017   Procedure: LOOP RECORDER INSERTION;  Surgeon: Isaias Cowman, MD;  Location: [facility];  Service: Cardiovascular;  Laterality: N/A;    Prior to Admission medications   Medication  Sig Start Date End Date Taking? Authorizing Provider  albuterol (PROVENTIL HFA;VENTOLIN HFA) 108 (90 BASE) MCG/ACT inhaler Inhale 2 puffs into the lungs every 6 (six) hours as needed for wheezing or shortness of breath.    [provider]  aspirin 325 MG tablet Take 1 tablet (325 mg total) by mouth daily. 08/02/17   Max Sane, MD  budesonide-formoterol [facility]) 160-4.5 MCG/ACT inhaler Inhale 2 puffs into the lungs 2 (two) times daily.    [provider]  chlordiazePOXIDE (LIBRIUM) 10 MG capsule Take 1 capsule (10 mg total) by mouth 3 (three) times daily. 09/25/17   Carrie Mew, MD  cholecalciferol (VITAMIN D) 1000 units tablet Take 2,000 Units by mouth daily.    [provider]  folic acid (FOLVITE) 1 MG tablet Take 1 tablet (1 mg total) by mouth daily. 08/02/17   Max Sane, MD  gabapentin (NEURONTIN) 300 MG capsule Take 300 mg by mouth at bedtime. 08/15/17 02/11/18  [provider]  Ipratropium-Albuterol (COMBIVENT RESPIMAT) 20-100 MCG/ACT AERS respimat Inhale 2 puffs into the lungs every 6 (six) hours as needed for wheezing or shortness of breath.    [provider]  ipratropium-albuterol (DUONEB) 0.5-2.5 (3) MG/3ML SOLN Take 3 mLs by nebulization every 6 (six) hours as needed (for shortness of breath/wheezing).    [provider]  lamoTRIgine (LAMICTAL) 100 MG tablet Take 1 tablet (100 mg total) by mouth 2 (two) times daily for 30 days. 08/08/18 09/07/18  Arta Silence, MD  metoprolol tartrate (LOPRESSOR) 50 MG tablet Take 50 mg by mouth  daily. 12/07/17   [provider]  Multiple Vitamins-Minerals (MULTIVITAMIN WITH MINERALS) tablet Take 1 tablet by mouth daily.    [provider]  OXYGEN Inhale 2 L into the lungs at bedtime.    [provider]  thiamine 100 MG tablet Take 1 tablet (100 mg total) by mouth daily. 08/02/17   Max Sane, MD  vitamin B-12 (CYANOCOBALAMIN) 1000 MCG tablet Take 1,000 mcg by mouth  daily.    [provider]    Allergies  Allergen Reactions  . Atorvastatin Hives and Itching    All over trunk of body  . Levaquin [Levofloxacin] Hives and Other (See Comments)    Rash, itching all over trunk of body    Family History  Problem Relation Age of Onset  . Hypertension Other     Social History Social History   Tobacco Use  . Smoking status: Current Every Day Smoker    Packs/day: 1.00    Types: Cigarettes  . Smokeless tobacco: Never Used  Substance Use Topics  . Alcohol use: Yes    Alcohol/week: 42.0 standard drinks    Types: 42 Cans of beer per week    Comment: "2 40 oz and a 24 oz" 08/08/2018  . Drug use: Yes    Types: Marijuana    Review of Systems Constitutional: Negative for fever. Cardiovascular: Negative for chest pain. Respiratory: Negative for shortness of breath. Gastrointestinal: Upper abdominal pain especially in the right upper quadrant.  Positive for nausea.  Negative for vomiting or diarrhea. Genitourinary: Negative for urinary compaints Musculoskeletal: Negative for musculoskeletal complaints Skin: Negative for skin complaints  Neurological: Negative for headache All other ROS negative  ____________________________________________   PHYSICAL EXAM:  VITAL SIGNS: ED Triage Vitals  Enc Vitals Group     BP 08/23/18 1206 (!) 166/111     Pulse Rate 08/23/18 1206 (!) 131     Resp 08/23/18 1206 18     Temp 08/23/18 1201 98.4 F (36.9 C)     Temp Source 08/23/18 1201 Oral     SpO2 08/23/18 1201 97 %     Weight 08/23/18 1203 185 lb (83.9 kg)     Height 08/23/18 1203 5\' 9"  (1.753 m)     Head Circumference --      Peak Flow --      Pain Score 08/23/18 1202 5     Pain Loc --      Pain Edu? --      Excl. in [place]? --     Constitutional: Alert and oriented. Well appearing and in no distress. Eyes: Normal exam ENT   Head: Normocephalic and atraumatic.   Mouth/Throat: Mucous membranes are moist. Cardiovascular: Normal  rate, regular rhythm.  Respiratory: Normal respiratory effort without tachypnea nor retractions. Breath sounds are clear  Gastrointestinal: Soft, moderate right upper quadrant tenderness palpation with mild epigastric tenderness palpation.  No rebound guarding or distention.  Abdomen otherwise benign. Musculoskeletal: Nontender with normal range of motion in all extremities.  Neurologic:  Normal speech and language. No gross focal neurologic deficits  Skin:  Skin is warm, dry and intact.  Psychiatric: Mood and affect are normal.   ____________________________________________    EKG  EKG viewed and interpreted by myself shows sinus tachycardia 131 bpm with a narrow QRS, normal axis, normal intervals, nonspecific ST changes without ST elevation.  ____________________________________________    RADIOLOGY  IMPRESSION: *Cholelithiasis with trace free pericholecystic fluid and positive sonographic Murphy sign. Clinical correlation for possible acute cholecystitis recommended. *  No biliary dilatation. *Hepatic steatosis.  Chest x-ray negative  ____________________________________________   INITIAL IMPRESSION / ASSESSMENT AND PLAN / ED COURSE  Pertinent labs & imaging results that were available during my care of the patient were reviewed by me and considered in my medical decision making (see chart for details).  Patient presents to the emergency department for right upper quadrant abdominal pain and epigastric pain starting shortly after eating around 5:00 this morning.  Differential this time would include cholecystitis, biliary colic, gastritis, peptic ulcer disease.  We will check labs including LFTs and lipase.  We will obtain a right upper quadrant to further evaluate.  Patient agreeable to plan of care.  Patient's chest x-ray is negative.  Ultrasound shows cholelithiasis with trace pericholecystic fluid.  Patient does continue have right upper quadrant tenderness.  I discussed  with Dr. Rosana Hoes who recommends Toradol and fentanyl.  He will be down to see the patient.  ____________________________________________   FINAL CLINICAL IMPRESSION(S) / ED DIAGNOSES  Right upper quadrant abdominal pain   Harvest Dark, MD 08/23/18 1444

## 2018-08-23 NOTE — H&P (Addendum)
SURGICAL ADMISSION HISTORY & PHYSICAL (cpt: [phone])  Patient seen and examined as described below with surgical PA-C, Ardell Isaacs.  Assessment/Plan: (ICD-10's: K80.20 +/- K70.31 vs K80.01) In summary, patient is a 53 y.o. male with nearly resolved RUQ abdominal pain (persistent with morphine, relieved with Toradol and fentanyl x1), hyperbilirubinemia, and mild leukocytosis, suggestive of symptomatic cholelithiasis +/- alcohol-associated cirrhosis vs acute cholecystitis, complicated by comorbidities including severe chronic alcohol abuse/dependence, HTN, symptomatic COPD (not yet on home supplemental oxygen), CHF, Left hemiparesis s/p stroke, chronic atrial fibrillation on full dose ASA without therapeutic anticoagulation, seizure disorder, generalized anxiety disorder, and chronic ongoing tobacco abuse (smoking).              - will admit to surgical service overnight             - CIWA protocol considering patient's high risk for alcohol withdrawal             - avoid morphine and similar (Toradol, fentanyl given for pain, since nearly resolved)             - HIDA to be performed tomorrow pending radioisotope availability to differentiate cholecystitis from cholelithiasis with cirrhosis             - discussed with patient natural history/risks of cholelithiasis +/- cholecystitis and management options in context of his comorbidities             - to reduce risk of false positive HIDA results and for patient comfort, will order clear liquids diet until midnight, NPO after midnight             - further management and disposition recommendations pending HIDA results  - follow up / trend CBC and CMP tomorrow morning             - please call if any questions or concerns  I have personally reviewed the patient's chart, evaluated/examined the patient, proposed the recommended management, and discussed these recommendations with the patient and his mom to their expressed satisfaction as well as  with patient's ED physician.  Thank you for the opportunity to participate in this patient's care.  -- Marilynne Drivers Rosana Hoes, MD, [place]: [facility] - Partnering for exceptional care. Office: [phone]  [facility] SURGICAL ADMISSION HISTORY & PHYSICAL (cpt: [phone])   HISTORY OF PRESENT ILLNESS (HPI):  53 y.o. male presented to [facility] ED today for evaluation of abdominal pain. Patient reports that around 4 AM he woke up as he typically does and made taquitos and coffee for breakfast. After eating this, he noticed the acute onset of epigastric and RUQ sharp abdominal pain. He endorsed associated nausea and multiple episodes of non-bloody, non-bilious emesis. He says the pain did not improve after multiple doses of morphine, but has nearly resolved following recommended Toradol and fentanyl. He also describes multiple prior less severe episodes (including one 2 weeks ago after eating pork chops) and a few episodes for which he presented to ED and was discharged home. He denies fevers/chills, change in BM's, or CP, describes SOB unchanged from baseline, which requires him to lean on mailbox to catch his breath after walking from his house to pick up his mail. He says he otherwise sits at home and drinks alcohol, smokes tobacco and marijuana. He specifies he no longer drinks 3 - 4 twelve-packs of beer per day and "only" now drinks "two 40 oz and one 24 oz beers" per day. He is unable to  say whether he could walk up a flight of steps without CP or SOB as he avoids doing so due to Left-sided weakness s/p stroke.  Surgery is consulted by emergency medicine physician Dr. Harvest Dark, MD in this context for evaluation and management of cholelithiasis with possible cholecystitis.  PAST MEDICAL HISTORY (PMH):      Past Medical History:  Diagnosis Date  . Alcohol abuse   . Anxiety   . CHF (congestive heart failure) ([place])   . COPD  (chronic obstructive pulmonary disease) ([place])   . Hypertension   . Seizures ([place])   . Stroke [facility])     PAST SURGICAL HISTORY ([place]):       Past Surgical History:  Procedure Laterality Date  . LOOP RECORDER INSERTION N/A 09/15/2017   Procedure: LOOP RECORDER INSERTION;  Surgeon: Isaias Cowman, MD;  Location: [place] [facility];  Service: Cardiovascular;  Laterality: N/A;    MEDICATIONS:         Prior to Admission medications   Medication Sig Start Date End Date Taking? Authorizing Provider  albuterol (PROVENTIL HFA;VENTOLIN HFA) 108 (90 BASE) MCG/ACT inhaler Inhale 2 puffs into the lungs every 6 (six) hours as needed for wheezing or shortness of breath.   Yes [provider]  aspirin 325 MG tablet Take 1 tablet (325 mg total) by mouth daily. 08/02/17  Yes Max Sane, MD  budesonide-formoterol (SYMBICORT) 160-4.5 MCG/ACT inhaler Inhale 2 puffs into the lungs 2 (two) times daily.   Yes [provider]  cholecalciferol (VITAMIN D) 1000 units tablet Take 2,000 Units by mouth daily.   Yes [provider]  gabapentin (NEURONTIN) 300 MG capsule Take 300 mg by mouth 2 (two) times daily.  08/15/17 08/23/18 Yes [provider]  Ipratropium-Albuterol (COMBIVENT RESPIMAT) 20-100 MCG/ACT AERS respimat Inhale 2 puffs into the lungs every 6 (six) hours as needed for wheezing or shortness of breath.   Yes [provider]  ipratropium-albuterol (DUONEB) 0.5-2.5 (3) MG/3ML SOLN Take 3 mLs by nebulization every 6 (six) hours as needed (for shortness of breath/wheezing).   Yes [provider]  lamoTRIgine (LAMICTAL) 100 MG tablet Take 1 tablet (100 mg total) by mouth 2 (two) times daily for 30 days. 08/08/18 09/07/18 Yes Arta Silence, MD  lisinopril (PRINIVIL,ZESTRIL) 2.5 MG tablet Take 2.5 mg by mouth daily. 08/10/18  Yes [provider]  metoprolol tartrate (LOPRESSOR) 50 MG tablet Take 50 mg by mouth 2 (two) times daily.   12/07/17  Yes [provider]  Multiple Vitamins-Minerals (MULTIVITAMIN WITH MINERALS) tablet Take 1 tablet by mouth daily.   Yes [provider]  vitamin B-12 (CYANOCOBALAMIN) 1000 MCG tablet Take 1,000 mcg by mouth daily.   Yes [provider]     ALLERGIES:  Allergies  Allergen Reactions  . Atorvastatin Hives and Itching    All over trunk of body  . Levaquin [Levofloxacin] Hives and Other (See Comments)    Rash, itching all over trunk of body    SOCIAL HISTORY:  Social History        Socioeconomic History  . Marital status: Single    Spouse name: Not on file  . Number of children: Not on file  . Years of education: Not on file  . Highest education level: Not on file  Occupational History  . Not on file  Social Needs  . Financial resource strain: Hard  . Food insecurity:    Worry: Sometimes true    Inability: Sometimes true  . Transportation  needs:    Medical: No    Non-medical: No  Tobacco Use  . Smoking status: Current Every Day Smoker    Packs/day: 1.00    Types: Cigarettes  . Smokeless tobacco: Never Used  Substance and Sexual Activity  . Alcohol use: Yes    Alcohol/week: 42.0 standard drinks    Types: 42 Cans of beer per week    Comment: "2 40 oz and a 24 oz" 08/08/2018  . Drug use: Yes    Types: Marijuana  . Sexual activity: Not Currently  Lifestyle  . Physical activity:    Days per week: 0 days    Minutes per session: 0 min  . Stress: Patient refused  Relationships  . Social connections:    Talks on phone: Patient refused    Gets together: Patient refused    Attends religious service: Patient refused    Active member of club or organization: Patient refused    Attends meetings of clubs or organizations: Patient refused    Relationship status: Patient refused  . Intimate partner violence:    Fear of current or ex partner: Patient refused    Emotionally abused: Patient  refused    Physically abused: Patient refused    Forced sexual activity: Patient refused  Other Topics Concern  . Not on file  Social History Narrative  . Not on file    FAMILY HISTORY:       Family History  Problem Relation Age of Onset  . Hypertension Other     REVIEW OF SYSTEMS:  Review of Systems  Constitutional: Negative for chills and fever.  Respiratory: Negative for cough and shortness of breath.   Cardiovascular: Negative for chest pain and palpitations.  Gastrointestinal: Positive for abdominal pain, nausea and vomiting. Negative for blood in stool, constipation and diarrhea.  Genitourinary: Negative for dysuria, hematuria and urgency.  Musculoskeletal: Negative for myalgias and neck pain.  Neurological: Negative for dizziness and headaches.  All other systems reviewed and are negative.  VITAL SIGNS:  Temp:  [98.4 F (36.9 C)] 98.4 F (36.9 C) (02/12 1201) Pulse Rate:  [117-131] 117 (02/12 1430) Resp:  [16-18] 16 (02/12 1430) BP: (166-221)/(111-117) 221/117 (02/12 1430) SpO2:  [93 %-97 %] 93 % (02/12 1430) Weight:  [83.9 kg] 83.9 kg (02/12 1203)     Height: 5\' 9"  (175.3 cm) Weight: 83.9 kg BMI (Calculated): 27.31   INTAKE/OUTPUT:  This shift: Total I/O In: 1000 [IV Piggyback:1000] Out: -   Last 2 shifts: @IOLAST2SHIFTS @   PHYSICAL EXAM:  Physical Exam Vitals signs and nursing note reviewed.  Constitutional:      General: He is not in acute distress.    Appearance: He is well-developed. He is obese. He is not ill-appearing.  HENT:     Head: Normocephalic and atraumatic.  Eyes:     General: No scleral icterus.    Extraocular Movements: Extraocular movements intact.  Cardiovascular:     Rate and Rhythm: Regular rhythm. Tachycardia present.     Heart sounds: Normal heart sounds. No murmur. No friction rub. No gallop.   Pulmonary:     Effort: Pulmonary effort is normal. No respiratory distress.     Breath sounds: Wheezing (Faint, bilaterally)  present. No rhonchi.  Abdominal:     General: Abdomen is protuberant. There is no distension.     Palpations: Abdomen is soft.     Tenderness: There is mild abdominal tenderness in the right upper quadrant. There is no guarding or rebound. Negative signs include  Murphy's sign.  Genitourinary:    Comments: Deferred Skin:    General: Skin is warm and dry.     Coloration: Skin is not cyanotic or jaundiced.  Neurological:     General: No focal deficit present.     Mental Status: He is alert.  Psychiatric:        Mood and Affect: Mood normal.        Behavior: Behavior normal.   Labs:  CBC Latest Ref Rng & Units 08/23/2018 08/08/2018 03/03/2018  WBC 4.0 - 10.5 K/uL 12.9(H) 8.6 8.0  Hemoglobin 13.0 - 17.0 g/dL 16.7 14.8 16.7  Hematocrit 39.0 - 52.0 % 47.6 43.8 45.9  Platelets 150 - 400 K/uL 168 171 199   CMP Latest Ref Rng & Units 08/23/2018 08/08/2018 03/03/2018  Glucose 70 - 99 mg/dL 125(H) 105(H) 96  BUN 6 - 20 mg/dL 7 8 9   Creatinine 0.61 - 1.24 mg/dL 0.80 1.13 0.79  Sodium 135 - 145 mmol/L 143 136 137  Potassium 3.5 - 5.1 mmol/L 4.4 3.8 4.3  Chloride 98 - 111 mmol/L 101 105 101  CO2 22 - 32 mmol/L 29 17(L) 27  Calcium 8.9 - 10.3 mg/dL 10.1 8.7(L) 10.0  Total Protein 6.5 - 8.1 g/dL 8.4(H) - -  Total Bilirubin 0.3 - 1.2 mg/dL 1.6(H) - -  Alkaline Phos 38 - 126 U/L 87 - -  AST 15 - 41 U/L 103(H) - -  ALT 0 - 44 U/L 60(H) - -   Imaging studies:  RUQ [place] (08/23/2018):  1) Cholelithiasis with trace free pericholecystic fluid and positive sonographic Murphy sign. Clinical correlation for possible acute cholecystitis recommended. 2) No biliary dilatation. 3) Hepatic steatosis.  Assessment/Plan: (ICD-10's: K87.20) 53 y.o. male with nearly resolved RUQ abdominal pain (persistent with morphine, relieved with Toradol and fentanyl x1), hyperbilirubinemia, and mild leukocytosis, suggestive of symptomatic cholelithiasis +/- alcohol-associated cirrhosis vs acute cholecystitis, complicated by  comorbidities including severe chronic alcohol abuse/dependence, HTN, symptomatic COPD (not yet on home supplemental oxygen), CHF, Left hemiparesis s/p stroke, chronic atrial fibrillation on full dose ASA without therapeutic anticoagulation, seizure disorder, generalized anxiety disorder, and chronic ongoing tobacco abuse (smoking).              - Remain NPO, IVF             - Recommend obtaining HIDA scan w/o gallbladder EF to differentiate cholecystitis from cholelithiasis with cirrhosis               - Discussed with patient natural history/risks of cholelithiasis +/- cholecystitis and management options in context of his comorbidities             - Further management and disposition recommendations pending HIDA results             - Monitor abdominal examination, vital signs  All of the above findings and recommendations were discussed with the patient and his mom, and all of patient's and his family's questions were answered to their expressed satisfaction.  Thank you for the opportunity to participate in this patient's care.   -- Edison Simon, PA-C [facility] 08/23/2018, 3:35 PM [phone] M-F: 7am - 4pm

## 2018-08-23 NOTE — ED Notes (Signed)
ED TO INPATIENT HANDOFF REPORT  Name/Age/Gender George Arellano 53 y.o. male  Code Status    Code Status Orders  (From admission, onward)         Start     Ordered   08/23/18 1729  Full code  Continuous     08/23/18 1732        Code Status History    Date Active Date Inactive Code Status Order ID Comments User Context   07/18/2017 0900 08/01/2017 1611 Full Code [id number]  Epifanio Lesches, MD ED   05/05/2015 2122 05/07/2015 1534 Full Code [id number]  Hower, Aaron Mose, MD ED      Home/SNF/Other From home   Chief Complaint abd pain  Level of Care/Admitting Diagnosis ED Disposition    ED Disposition Condition [place]: [place] [100120]  Level of Care: Med-Surg [16]  Diagnosis: Cholelithiasis [967893]  Admitting Physician: Vickie Epley [[id number]]  Attending Physician: Vickie Epley [[id number]]  PT Class (Do Not Modify): Observation [104]  PT Acc Code (Do Not Modify): Observation [10022]       Medical History Past Medical History:  Diagnosis Date  . Alcohol abuse   . Anxiety   . CHF (congestive heart failure) ([place])   . COPD (chronic obstructive pulmonary disease) ([place])   . Hypertension   . Seizures ([place])   . Stroke [facility])     Allergies Allergies  Allergen Reactions  . Atorvastatin Hives and Itching    All over trunk of body  . Levaquin [Levofloxacin] Hives and Other (See Comments)    Rash, itching all over trunk of body    IV Location/Drains/Wounds Patient Lines/Drains/Airways Status   Active Line/Drains/Airways    Name:   Placement date:   Placement time:   Site:   Days:   Peripheral IV 03/03/18 Left Forearm   03/03/18    1146    Forearm   173   Peripheral IV 08/23/18 Left Antecubital   08/23/18    1217    Antecubital   less than 1          Labs/Imaging Results for orders placed or performed during the hospital encounter of 08/23/18 (from the past 48 hour(s))  Basic metabolic panel     Status:  Abnormal   Collection Time: 08/23/18 12:09 PM  Result Value Ref Range   Sodium 143 135 - 145 mmol/L   Potassium 4.4 3.5 - 5.1 mmol/L   Chloride 101 98 - 111 mmol/L   CO2 29 22 - 32 mmol/L   Glucose, Bld 125 (H) 70 - 99 mg/dL   BUN 7 6 - 20 mg/dL   Creatinine, Ser 0.80 0.61 - 1.24 mg/dL   Calcium 10.1 8.9 - 10.3 mg/dL   GFR calc non Af Amer >60 >60 mL/min   GFR calc Af Amer >60 >60 mL/min   Anion gap 13 5 - 15    Comment: Performed at [facility], [address] [place], [place] [address]  CBC     Status: Abnormal   Collection Time: 08/23/18 12:09 PM  Result Value Ref Range   WBC 12.9 (H) 4.0 - 10.5 K/uL   RBC 4.76 4.22 - 5.81 MIL/uL   Hemoglobin 16.7 13.0 - 17.0 g/dL   HCT 47.6 39.0 - 52.0 %   MCV 100.0 80.0 - 100.0 fL   MCH 35.1 (H) 26.0 - 34.0 pg   MCHC 35.1 30.0 - 36.0 g/dL   RDW 11.5 11.5 -  15.5 %   Platelets 168 150 - 400 K/uL   nRBC 0.0 0.0 - 0.2 %    Comment: Performed at [facility], [address] [place], [place] [address]  Lipase, blood     Status: None   Collection Time: 08/23/18 12:09 PM  Result Value Ref Range   Lipase 28 11 - 51 U/L    Comment: Performed at [facility], [address] [place], [place] [address]  Urinalysis, Complete w Microscopic     Status: Abnormal   Collection Time: 08/23/18 12:09 PM  Result Value Ref Range   Color, Urine YELLOW (A) YELLOW   APPearance CLEAR (A) CLEAR   Specific Gravity, Urine 1.010 1.005 - 1.030   pH 7.0 5.0 - 8.0   Glucose, UA 50 (A) NEGATIVE mg/dL   Hgb urine dipstick NEGATIVE NEGATIVE   Bilirubin Urine NEGATIVE NEGATIVE   Ketones, ur 20 (A) NEGATIVE mg/dL   Protein, ur NEGATIVE NEGATIVE mg/dL   Nitrite NEGATIVE NEGATIVE   Leukocytes,Ua NEGATIVE NEGATIVE   WBC, UA 0-5 0 - 5 WBC/hpf   Bacteria, UA NONE SEEN NONE SEEN   Squamous Epithelial / LPF NONE SEEN 0 - 5   Mucus PRESENT     Comment: Performed at [facility], [address] [place], [place] [address]  Hepatic  function panel     Status: Abnormal   Collection Time: 08/23/18 12:09 PM  Result Value Ref Range   Total Protein 8.4 (H) 6.5 - 8.1 g/dL   Albumin 4.6 3.5 - 5.0 g/dL   AST 103 (H) 15 - 41 U/L   ALT 60 (H) 0 - 44 U/L   Alkaline Phosphatase 87 38 - 126 U/L   Total Bilirubin 1.6 (H) 0.3 - 1.2 mg/dL   Bilirubin, Direct 0.4 (H) 0.0 - 0.2 mg/dL   Indirect Bilirubin 1.2 (H) 0.3 - 0.9 mg/dL    Comment: Performed at [facility], [address] [place],  [address]   Dg Chest 2 View  Result Date: 08/23/2018 CLINICAL DATA:  Chest pain and shortness of breath EXAM: CHEST - 2 VIEW COMPARISON:  July 27, 2017 FINDINGS: No edema or consolidation. Heart size and pulmonary vascularity are normal. No adenopathy. No bone lesions. There is a loop recorder on the left anteriorly. There are foci of carotid artery calcification bilaterally. There is aortic atherosclerosis. IMPRESSION: No edema or consolidation.  There is carotid artery calcification. Aortic Atherosclerosis (ICD10-I70.0). Electronically Signed   By: Lowella Grip III M.D.   On: 08/23/2018 13:15   US Abdomen Limited Ruq  Result Date: 08/23/2018 CLINICAL DATA:  Initial evaluation for acute right upper quadrant abdominal pain. EXAM: ULTRASOUND ABDOMEN LIMITED RIGHT UPPER QUADRANT COMPARISON:  Prior ultrasound from 10/08/2015. FINDINGS: Gallbladder: Multiple shadowing echogenic stones present within the gallbladder lumen, largest of which measures 7 mm. Probable small amount of associated sludge. Gallbladder wall measures within normal limits at 2 mm. Trace free pericholecystic fluid. Sonographic Percell Miller sign was elicited on exam. Common bile duct: Diameter: 6.4 mm. Liver: No focal lesion identified. Diffusely increased echogenicity throughout the hepatic parenchyma. Portal vein is patent on color Doppler imaging with normal direction of blood flow towards the liver. IMPRESSION: *Cholelithiasis with trace free pericholecystic fluid and  positive sonographic Murphy sign. Clinical correlation for possible acute cholecystitis recommended. *No biliary dilatation. *Hepatic steatosis. Electronically Signed   By: Jeannine Boga M.D.   On: 08/23/2018 14:13    Pending Labs Unresulted Labs (From admission, onward)  Start     Ordered   08/24/18 0500  Comprehensive metabolic panel  Tomorrow morning,   STAT     08/23/18 1732   08/24/18 0500  CBC  Tomorrow morning,   STAT     08/23/18 1732   08/23/18 1729  HIV antibody (Routine Testing)  Once,   STAT     08/23/18 1732          Vitals/Pain Today's Vitals   08/23/18 1845 08/23/18 1900 08/23/18 1915 08/23/18 1930  BP:  (!) 153/95  (!) 160/91  Pulse:      Resp: 14 12 12 16   Temp:      TempSrc:      SpO2:      Weight:      Height:      PainSc:        Isolation Precautions No active isolations  Medications Medications  morphine 4 MG/ML injection 4 mg (4 mg Intravenous Not Given 08/23/18 1456)  LORazepam (ATIVAN) tablet 1 mg (has no administration in time range)    Or  LORazepam (ATIVAN) injection 1 mg (has no administration in time range)  thiamine (VITAMIN B-1) tablet 100 mg (has no administration in time range)    Or  thiamine (B-1) injection 100 mg (has no administration in time range)  folic acid (FOLVITE) tablet 1 mg (has no administration in time range)  multivitamin with minerals tablet 1 tablet (has no administration in time range)  albuterol (PROVENTIL HFA;VENTOLIN HFA) 108 (90 Base) MCG/ACT inhaler 2 puff (has no administration in time range)  mometasone-formoterol (DULERA) 200-5 MCG/ACT inhaler 2 puff (has no administration in time range)  gabapentin (NEURONTIN) capsule 300 mg (has no administration in time range)  Ipratropium-Albuterol (COMBIVENT) respimat 2 puff (has no administration in time range)  ipratropium-albuterol (DUONEB) 0.5-2.5 (3) MG/3ML nebulizer solution 3 mL (has no administration in time range)  lamoTRIgine (LAMICTAL) tablet 100 mg  (has no administration in time range)  lisinopril (PRINIVIL,ZESTRIL) tablet 2.5 mg (has no administration in time range)  metoprolol tartrate (LOPRESSOR) tablet 50 mg (has no administration in time range)  multivitamin with minerals tablet 1 tablet (has no administration in time range)  vitamin B-12 (CYANOCOBALAMIN) tablet 1,000 mcg (has no administration in time range)  morphine 4 MG/ML injection 4 mg (4 mg Intravenous Given 08/23/18 1319)  ondansetron (ZOFRAN) injection 4 mg (4 mg Intravenous Given 08/23/18 1319)  sodium chloride 0.9 % bolus 1,000 mL (0 mLs Intravenous Stopped 08/23/18 1427)  metoprolol tartrate (LOPRESSOR) injection 5 mg (5 mg Intravenous Given 08/23/18 1448)  ketorolac (TORADOL) 30 MG/ML injection 30 mg (30 mg Intravenous Given 08/23/18 1448)  fentaNYL (SUBLIMAZE) injection 50 mcg (50 mcg Intravenous Given 08/23/18 1448)    Mobility Ambulates }

## 2018-08-23 NOTE — ED Notes (Signed)
Reports abd pain x 1 day, denies cp/sob. Patient states abd pain feels like he has a gas bubble and needs to burp otherwaise senies n/v/f/c/d

## 2018-08-23 NOTE — ED Triage Notes (Signed)
Pt to ED from home c/o upper abd pain, lower chest pain that started several hours ago, states feels like "someone punched me in my belly", denies n/v/d but feels like just has to burp.  Pt A&Ox4, speaking in complete and coherent sentences, chest rise even and unlabored.

## 2018-08-24 ENCOUNTER — Encounter: Payer: Self-pay | Admitting: Radiology

## 2018-08-24 ENCOUNTER — Emergency Department: Payer: Medicaid Other

## 2018-08-24 DIAGNOSIS — K8020 Calculus of gallbladder without cholecystitis without obstruction: Secondary | ICD-10-CM | POA: Diagnosis not present

## 2018-08-24 LAB — CBC
HCT: 45.3 % (ref 39.0–52.0)
Hemoglobin: 15.7 g/dL (ref 13.0–17.0)
MCH: 35 pg — ABNORMAL HIGH (ref 26.0–34.0)
MCHC: 34.7 g/dL (ref 30.0–36.0)
MCV: 101.1 fL — ABNORMAL HIGH (ref 80.0–100.0)
Platelets: 145 10*3/uL — ABNORMAL LOW (ref 150–400)
RBC: 4.48 MIL/uL (ref 4.22–5.81)
RDW: 11.4 % — AB (ref 11.5–15.5)
WBC: 7.7 10*3/uL (ref 4.0–10.5)
nRBC: 0 % (ref 0.0–0.2)

## 2018-08-24 LAB — PROTIME-INR
INR: 1.01
Prothrombin Time: 13.2 seconds (ref 11.4–15.2)

## 2018-08-24 LAB — COMPREHENSIVE METABOLIC PANEL
ALT: 52 U/L — ABNORMAL HIGH (ref 0–44)
AST: 83 U/L — ABNORMAL HIGH (ref 15–41)
Albumin: 4.2 g/dL (ref 3.5–5.0)
Alkaline Phosphatase: 70 U/L (ref 38–126)
Anion gap: 8 (ref 5–15)
BUN: 8 mg/dL (ref 6–20)
CALCIUM: 8.9 mg/dL (ref 8.9–10.3)
CO2: 30 mmol/L (ref 22–32)
Chloride: 100 mmol/L (ref 98–111)
Creatinine, Ser: 0.69 mg/dL (ref 0.61–1.24)
GFR calc non Af Amer: >60 mL/min (ref >60)
Glucose, Bld: 117 mg/dL — ABNORMAL HIGH (ref 70–99)
Potassium: 3.1 mmol/L — ABNORMAL LOW (ref 3.5–5.1)
Sodium: 138 mmol/L (ref 135–145)
Total Bilirubin: 1.9 mg/dL — ABNORMAL HIGH (ref 0.3–1.2)
Total Protein: 7 g/dL (ref 6.5–8.1)

## 2018-08-24 MED ORDER — KETOROLAC TROMETHAMINE 15 MG/ML IJ SOLN
15.0000 mg | Freq: Four times a day (QID) | INTRAMUSCULAR | Status: AC | PRN
  Administered 2018-08-24: 15 mg via INTRAVENOUS
  Filled 2018-08-24: qty 1

## 2018-08-24 MED ORDER — ENOXAPARIN SODIUM 40 MG/0.4ML ~~LOC~~ SOLN
40.0000 mg | SUBCUTANEOUS | Status: AC
  Administered 2018-08-24 – 2018-08-26 (×3): 40 mg via SUBCUTANEOUS
  Filled 2018-08-24 (×3): qty 0.4

## 2018-08-24 MED ORDER — MORPHINE SULFATE (PF) 2 MG/ML IV SOLN
2.0000 mg | INTRAVENOUS | Status: DC | PRN
  Administered 2018-08-29 – 2018-08-31 (×6): 2 mg via INTRAVENOUS
  Filled 2018-08-24 (×7): qty 1

## 2018-08-24 MED ORDER — LISINOPRIL 20 MG PO TABS
40.0000 mg | ORAL_TABLET | Freq: Every day | ORAL | Status: DC
  Administered 2018-08-24 – 2018-08-31 (×6): 40 mg via ORAL
  Filled 2018-08-24 (×6): qty 2

## 2018-08-24 MED ORDER — HYDRALAZINE HCL 20 MG/ML IJ SOLN: 10.0000 mg | Freq: Four times a day (QID) | INTRAMUSCULAR | Status: DC | PRN

## 2018-08-24 MED ORDER — SODIUM CHLORIDE 0.9 % IV SOLN
2.0000 g | INTRAVENOUS | Status: DC
  Administered 2018-08-24 – 2018-08-31 (×8): 2 g via INTRAVENOUS
  Filled 2018-08-24 (×3): qty 2
  Filled 2018-08-24: qty 20
  Filled 2018-08-24 (×5): qty 2

## 2018-08-24 MED ORDER — POTASSIUM CHLORIDE CRYS ER 20 MEQ PO TBCR
20.0000 meq | EXTENDED_RELEASE_TABLET | Freq: Two times a day (BID) | ORAL | Status: DC
  Administered 2018-08-24 – 2018-08-31 (×12): 20 meq via ORAL
  Filled 2018-08-24 (×13): qty 1

## 2018-08-24 MED ORDER — ONDANSETRON HCL 4 MG/2ML IJ SOLN
4.0000 mg | Freq: Four times a day (QID) | INTRAMUSCULAR | Status: DC | PRN
  Administered 2018-08-24 – 2018-08-29 (×3): 4 mg via INTRAVENOUS
  Filled 2018-08-24 (×3): qty 2

## 2018-08-24 MED ORDER — KETOROLAC TROMETHAMINE 15 MG/ML IJ SOLN
15.0000 mg | Freq: Once | INTRAMUSCULAR | Status: AC
  Administered 2018-08-24: 15 mg via INTRAVENOUS
  Filled 2018-08-24: qty 1

## 2018-08-24 MED ORDER — FENTANYL CITRATE (PF) 100 MCG/2ML IJ SOLN
50.0000 ug | Freq: Once | INTRAMUSCULAR | Status: AC
  Administered 2018-08-24: 50 ug via INTRAVENOUS
  Filled 2018-08-24: qty 2

## 2018-08-24 MED ORDER — HYDRALAZINE HCL 20 MG/ML IJ SOLN
10.0000 mg | Freq: Four times a day (QID) | INTRAMUSCULAR | Status: DC | PRN
  Administered 2018-08-24 (×2): 10 mg via INTRAVENOUS
  Filled 2018-08-24 (×2): qty 1

## 2018-08-24 MED ORDER — MORPHINE SULFATE (PF) 4 MG/ML IV SOLN
3.1700 mg | Freq: Once | INTRAVENOUS | Status: AC
  Administered 2018-08-24: 3.17 mg via INTRAVENOUS
  Filled 2018-08-24: qty 1

## 2018-08-24 MED ORDER — TECHNETIUM TC 99M MEBROFENIN IV KIT
5.0000 | PACK | Freq: Once | INTRAVENOUS | Status: AC | PRN
  Administered 2018-08-24: 4.924 via INTRAVENOUS

## 2018-08-24 MED ORDER — TECHNETIUM TC 99M MEBROFENIN IV KIT
2.0900 | PACK | Freq: Once | INTRAVENOUS | Status: AC | PRN
  Administered 2018-08-24: 2.09 via INTRAVENOUS

## 2018-08-24 MED ORDER — AMLODIPINE BESYLATE 10 MG PO TABS
10.0000 mg | ORAL_TABLET | Freq: Every day | ORAL | Status: DC
  Administered 2018-08-24 – 2018-08-31 (×5): 10 mg via ORAL
  Filled 2018-08-24 (×6): qty 1

## 2018-08-24 NOTE — Progress Notes (Addendum)
SURGICAL PROGRESS NOTE (cpt: [phone])  Patient seen and examined as described below with surgical PA-C, Ardell Isaacs.  Additional Imaging: HIDA Nuclear Medicine Hepatobiliary Scan (08/24/2018) Nonvisualization of the gallbladder is consistent with  cystic duct obstruction and ACUTE CHOLECYSTITIS.  Assessment/Plan: (ICD-10's: K80.20 +/- K70.31 vs K80.01) In summary, patient is a 53 y.o. male with acute calculous cholecystitis, complicated by resolved leukocytosis, chronic hyperbilirubinemia (since ~2015),withdrawal from severe chronic alcohol abuse/dependence, and comorbidities including HTN, COPD (on qhs home supplemental oxygen), CHF, Left hemiparesis s/p stroke, chronicatrial fibrillation on full dose ASA without therapeutic anticoagulation, seizuredisorder,generalized anxiety disorder, and chronic ongoingtobacco and marijuana abuse (smoking).   - resume clear liquids diet  - CIWA protocol for alcohol withdrawal  - medical consultation appreciated for medical management and risk stratification/optimization  - HIDA discussed with radiologist, who also reviewed abdominal ultrasound with minimal ascites described  - significant potentially fatal risks of cholecystectomy vs percutaneous cholecystostomy drain (in context of developing ascites associated with chronic alcohol-associated cirrhosis/liver disease) vs comfort-oriented management were discussed at length with patient, who expresses his preference to proceed with cholecystectomy pending further medical optimization and alcohol detoxification per Dr. Darvin Neighbours (with whom LVEF and labs were discussed), and informed consent was obtained  - patient tentatively added to OR schedule for Monday afternoon  - continue ceftriaxone, pain control, and anti-emetic prn  - DVT prophylaxis, ambulation encouraged  I have personally reviewed the patient's chart, evaluated/examined the patient, proposed the recommended management, and discussed these  recommendations with the patient to his expressed satisfaction as well as with patient's RN and medical physician.  -- Marilynne Drivers Rosana Hoes, MD, [place]: [facility] - Partnering for exceptional care. Office: [facility] SURGICAL PROGRESS NOTE (cpt [phone])  Hospital Day(s): 0.   Post op day(s):  Marland Kitchen   Interval History: Patient seen and examined. Early this morning, patient experienced difficulty with hypertension, for which home lisinopril and metoprolol were ordered to be given early, followed by 10 mg of IV hydralazine this morning as well. He continues to report RUQ > epigastric "discomfort", but much improved in comparison to the RUQ abdominal pain with which patient presented to [facility] ED yesterday.He otherwise denies fever/chills, N/V, tremors, headaches, or hallucinations.   Review of Systems:  Constitutional: denies fever, chills  Respiratory: denies any shortness of breath  Cardiovascular: denies chest pain or palpitations  Gastrointestinal: + abdominal pain, denied N/V, or diarrhea/and bowel function as per interval history Neurological: denies HA or vision/hearing changes, denied hallucinations, tremors   Vital signs in last 24 hours: [min-max] current  Temp:  [98.4 F (36.9 C)-98.7 F (37.1 C)] 98.6 F (37 C) (02/13 0455) Pulse Rate:  [87-131] 92 (02/13 0704) Resp:  [12-22] 20 (02/13 0455) BP: (137-227)/(89-129) 192/109 (02/13 0704) SpO2:  [88 %-100 %] 98 % (02/13 0457) Weight:  [79.3 kg-83.9 kg] 79.3 kg (02/12 2026)     Height: 5\' 9"  (175.3 cm) Weight: 79.3 kg BMI (Calculated): 25.81   Physical Exam:  Constitutional: alert, cooperative and no distress  HENT: normocephalic without obvious abnormality, on nasal cannula  Eyes: PERRL, EOM's grossly intact and symmetric  Respiratory: breathing non-labored at rest  Gastrointestinal: soft and protuberant (unchanged), but non-distended with moderate RUQ  abdominal tenderness to palpation Musculoskeletal: no edema or wounds, motor and sensation grossly intact, NT, SCDs bilaterally   Labs:  CBC Latest Ref Rng & Units 08/24/2018 08/23/2018 08/08/2018  WBC 4.0 - 10.5 K/uL 7.7 12.9(H) 8.6  Hemoglobin 13.0 -  17.0 g/dL 15.7 16.7 14.8  Hematocrit 39.0 - 52.0 % 45.3 47.6 43.8  Platelets 150 - 400 K/uL 145(L) 168 171   CMP Latest Ref Rng & Units 08/24/2018 08/23/2018 08/08/2018  Glucose 70 - 99 mg/dL 117(H) 125(H) 105(H)  BUN 6 - 20 mg/dL 8 7 8   Creatinine 0.61 - 1.24 mg/dL 0.69 0.80 1.13  Sodium 135 - 145 mmol/L 138 143 136  Potassium 3.5 - 5.1 mmol/L 3.1(L) 4.4 3.8  Chloride 98 - 111 mmol/L 100 101 105  CO2 22 - 32 mmol/L 30 29 17(L)  Calcium 8.9 - 10.3 mg/dL 8.9 10.1 8.7(L)  Total Protein 6.5 - 8.1 g/dL 7.0 8.4(H) -  Total Bilirubin 0.3 - 1.2 mg/dL 1.9(H) 1.6(H) -  Alkaline Phos 38 - 126 U/L 70 87 -  AST 15 - 41 U/L 83(H) 103(H) -  ALT 0 - 44 U/L 52(H) 60(H) -   Imaging studies: No new pertinent imaging studies  Assessment/Plan: (ICD-10's: K80.20 +/- K70.31 vs K80.01) 53 y.o. male with much improved, albeit persistent, mild RUQ abdominal pain, chronic hyperbilirubinemia since ~2015, and resolved leukocytosis (drawn prior to first antibiotic given this morning),suggestive of symptomaticcholelithiasis+ alcoholic liver disease vs acute cholecystitis, complicated bywithdrawal from severe chronic alcohol abuse/dependence and by comorbidities including, HTN, COPD (on qhs home supplemental oxygen), CHF, Left hemiparesis s/p stroke, chronicatrial fibrillation on full dose ASA without therapeutic anticoagulation, seizuredisorder,generalized anxiety disorder, and chronic ongoingtobacco and marijuana abuse (smoking).   - NPO this morning   - Pain control prn (AVOID Narcotics until after HIDA), antiemetics prn  - Await results of HIDA this morning, pending radioisotope availability to differentiatecholecystitis from cholelithiasis with alcoholic  liver disease  - Continue to monitor abdominal examination  - CIWA protocol given extensive alcohol history  - Replete K+, monitor + multivitamin  - Medical consultation for management of comorbidities  - further management and disposition recommendations pending HIDA results   All of the above findings and recommendations were discussed with the patient, and the medical team, and all of patient's questions were answered to their expressed satisfaction.  -- Edison Simon, PA-C [facility] 08/24/2018, 7:28 AM [phone] M-F: 7am - 4pm

## 2018-08-24 NOTE — Progress Notes (Signed)
MD notified that [facility] radiology notified RN of a critical result from HIDA scan. No new orders at this time.   Zakari Couchman [employer]

## 2018-08-24 NOTE — Consult Note (Signed)
[place] at [place] NAME: George Arellano    MR#:  [id number]  DATE OF BIRTH:  1966-04-11  DATE OF ADMISSION:  08/23/2018  PRIMARY CARE PHYSICIAN: System, Pcp Not In   CONSULT REQUESTING/REFERRING PHYSICIAN: Dr. Rosana Hoes  REASON FOR CONSULT: Uncontrolled HTN  CHIEF COMPLAINT:   Chief Complaint  Patient presents with  . Abdominal Pain    HISTORY OF PRESENT ILLNESS:  George Arellano  is a 53 y.o. male with a known history of HTN, alcohol abuse here with RUQ pain and vomiting. US showed gallstones with murphys +. HIDA scan done and results pending.  BP has been consistently elevated over 17 systolic with highest being 221/111. He has been started on CIWA protocol, PO lisinopril 2.5 mg and metoprolol 50 mg BID. Hydralazine IV 10 mg PRN. Received these in the morning. Drinks 3 x 12 pack beers a day.  PAST MEDICAL HISTORY:   Past Medical History:  Diagnosis Date  . Alcohol abuse   . Anxiety   . CHF (congestive heart failure) ([place])   . COPD (chronic obstructive pulmonary disease) ([place])   . Hypertension   . Seizures ([place])   . Stroke [facility])     PAST SURGICAL HISTOIRY:   Past Surgical History:  Procedure Laterality Date  . LOOP RECORDER INSERTION N/A 09/15/2017   Procedure: LOOP RECORDER INSERTION;  Surgeon: Isaias Cowman, MD;  Location: [facility];  Service: Cardiovascular;  Laterality: N/A;    SOCIAL HISTORY:   Social History   Tobacco Use  . Smoking status: Current Every Day Smoker    Packs/day: 1.00    Types: Cigarettes  . Smokeless tobacco: Never Used  Substance Use Topics  . Alcohol use: Yes    Alcohol/week: 42.0 standard drinks    Types: 42 Cans of beer per week    Comment: "2 40 oz and a 24 oz" 08/08/2018    FAMILY HISTORY:   Family History  Problem Relation Age of Onset  . Hypertension Other     DRUG ALLERGIES:   Allergies  Allergen Reactions  . Atorvastatin Hives and Itching    All over trunk of  body  . Levaquin [Levofloxacin] Hives and Other (See Comments)    Rash, itching all over trunk of body    REVIEW OF SYSTEMS:   ROS  CONSTITUTIONAL: Fatigue EYES: No blurred or double vision.  EARS, NOSE, AND THROAT: No tinnitus or ear pain.  RESPIRATORY: No cough, shortness of breath, wheezing or hemoptysis.  CARDIOVASCULAR: No chest pain, orthopnea, edema.  GASTROINTESTINAL: Vomiting, abd pain GENITOURINARY: No dysuria, hematuria.  ENDOCRINE: No polyuria, nocturia,  HEMATOLOGY: No anemia, easy bruising or bleeding SKIN: No rash or lesion. MUSCULOSKELETAL: No joint pain or arthritis.   NEUROLOGIC: No tingling, numbness, weakness.  PSYCHIATRY: No anxiety or depression.   MEDICATIONS AT HOME:   Prior to Admission medications   Medication Sig Start Date End Date Taking? Authorizing Provider  albuterol (PROVENTIL HFA;VENTOLIN HFA) 108 (90 BASE) MCG/ACT inhaler Inhale 2 puffs into the lungs every 6 (six) hours as needed for wheezing or shortness of breath.   Yes [provider]  aspirin 325 MG tablet Take 1 tablet (325 mg total) by mouth daily. 08/02/17  Yes Max Sane, MD  budesonide-formoterol (SYMBICORT) 160-4.5 MCG/ACT inhaler Inhale 2 puffs into the lungs 2 (two) times daily.   Yes [provider]  cholecalciferol (VITAMIN D) 1000 units tablet Take 2,000 Units by mouth daily.   Yes [provider]  gabapentin (NEURONTIN) 300 MG capsule Take 300 mg by mouth 2 (two) times daily.  08/15/17 08/23/18 Yes [provider]  Ipratropium-Albuterol (COMBIVENT RESPIMAT) 20-100 MCG/ACT AERS respimat Inhale 2 puffs into the lungs every 6 (six) hours as needed for wheezing or shortness of breath.   Yes [provider]  ipratropium-albuterol (DUONEB) 0.5-2.5 (3) MG/3ML SOLN Take 3 mLs by nebulization every 6 (six) hours as needed (for shortness of breath/wheezing).   Yes [provider]  lamoTRIgine (LAMICTAL) 100 MG tablet Take 1 tablet (100 mg  total) by mouth 2 (two) times daily for 30 days. 08/08/18 09/07/18 Yes Arta Silence, MD  lisinopril (PRINIVIL,ZESTRIL) 2.5 MG tablet Take 2.5 mg by mouth daily. 08/10/18  Yes [provider]  metoprolol tartrate (LOPRESSOR) 50 MG tablet Take 50 mg by mouth 2 (two) times daily.  12/07/17  Yes [provider]  Multiple Vitamins-Minerals (MULTIVITAMIN WITH MINERALS) tablet Take 1 tablet by mouth daily.   Yes [provider]  vitamin B-12 (CYANOCOBALAMIN) 1000 MCG tablet Take 1,000 mcg by mouth daily.   Yes [provider]      VITAL SIGNS:  Blood pressure (!) 171/109, pulse 97, temperature 97.7 F (36.5 C), temperature source Oral, resp. rate 20, height 5\' 9"  (1.753 m), weight 79.3 kg, SpO2 99 %.  PHYSICAL EXAMINATION:  GENERAL:  53 y.o.-year-old patient lying in the bed with no acute distress.  EYES: Pupils equal, round, reactive to light and accommodation. No scleral icterus. Extraocular muscles intact.  HEENT: Head atraumatic, normocephalic. Oropharynx and nasopharynx clear.  NECK:  Supple, no jugular venous distention. No thyroid enlargement, no tenderness.  LUNGS: Normal breath sounds bilaterally, no wheezing, rales,rhonchi or crepitation. No use of accessory muscles of respiration.  CARDIOVASCULAR: S1, S2 normal. No murmurs, rubs, or gallops.  ABDOMEN: Soft, tender upper abd, nondistended. Bowel sounds present. No organomegaly or mass.  EXTREMITIES: No pedal edema, cyanosis, or clubbing.  NEUROLOGIC: Cranial nerves II through XII are intact. Muscle strength 5/5 in all extremities. Sensation intact. Gait not checked.  PSYCHIATRIC: The patient is alert and oriented x 3.  SKIN: No obvious rash, lesion, or ulcer.   LABORATORY PANEL:   CBC Recent Labs  Lab 08/24/18 0428  WBC 7.7  HGB 15.7  HCT 45.3  PLT 145*   ------------------------------------------------------------------------------------------------------------------  Chemistries   Recent Labs  Lab 08/24/18 0428  NA 138  K 3.1*  CL 100  CO2 30  GLUCOSE 117*  BUN 8  CREATININE 0.69  CALCIUM 8.9  AST 83*  ALT 52*  ALKPHOS 70  BILITOT 1.9*   ------------------------------------------------------------------------------------------------------------------  Cardiac Enzymes No results for input(s): TROPONINI in the last 168 hours. ------------------------------------------------------------------------------------------------------------------  RADIOLOGY:  Dg Chest 2 View  Result Date: 08/23/2018 CLINICAL DATA:  Chest pain and shortness of breath EXAM: CHEST - 2 VIEW COMPARISON:  July 27, 2017 FINDINGS: No edema or consolidation. Heart size and pulmonary vascularity are normal. No adenopathy. No bone lesions. There is a loop recorder on the left anteriorly. There are foci of carotid artery calcification bilaterally. There is aortic atherosclerosis. IMPRESSION: No edema or consolidation.  There is carotid artery calcification. Aortic Atherosclerosis (ICD10-I70.0). Electronically Signed   By: Lowella Grip III M.D.   On: 08/23/2018 13:15   US Abdomen Limited Ruq  Result Date: 08/23/2018 CLINICAL DATA:  Initial evaluation for acute right upper quadrant abdominal pain. EXAM: ULTRASOUND ABDOMEN LIMITED RIGHT UPPER QUADRANT COMPARISON:  Prior ultrasound from 10/08/2015. FINDINGS: Gallbladder: Multiple shadowing echogenic stones present  within the gallbladder lumen, largest of which measures 7 mm. Probable small amount of associated sludge. Gallbladder wall measures within normal limits at 2 mm. Trace free pericholecystic fluid. Sonographic Percell Miller sign was elicited on exam. Common bile duct: Diameter: 6.4 mm. Liver: No focal lesion identified. Diffusely increased echogenicity throughout the hepatic parenchyma. Portal vein is patent on color Doppler imaging with normal direction of blood flow towards the liver. IMPRESSION: *Cholelithiasis with trace free pericholecystic  fluid and positive sonographic Murphy sign. Clinical correlation for possible acute cholecystitis recommended. *No biliary dilatation. *Hepatic steatosis. Electronically Signed   By: Jeannine Boga M.D.   On: 08/23/2018 14:13    EKG:   Orders placed or performed during the hospital encounter of 08/23/18  . ED EKG  . ED EKG    IMPRESSION AND PLAN:   * Uncontrolled HTN Higher at this time likely due to alcohol withdrawals. Will increase lisinopril to 40 mg daily. Continue metoprolol. Hydralazine IV PRN Reviewed old records and ED visits with elevated BP. Will also add norvasc which will take effect slowly.  * Alcohol withdrawal CIWA protocol.  * RUQ pain HIDA pending No lovenox or heparin in anticipation of possible cholecyctitis  Patient will be low risk for lap cholecystectomy surgery if his blood pressures improves and withdrawals resolved.  Recent echocardiogram and chest x-ray reviewed.   All the records are reviewed and case discussed with Consulting provider. Management plans discussed with the patient, family and they are in agreement.  CODE STATUS: FULL CODE  TOTAL TIME TAKING CARE OF THIS PATIENT: 40 minutes.    Leia Alf Pau Banh M.D on 08/24/2018 at 12:38 PM  Between 7am to 6pm - Pager - [phone]  After 6pm go to [email or web address] - password [email or web address] [facility]  Office  [phone]  CC: Primary care Physician: System, Pcp Not In  Note: This dictation was prepared with Dragon dictation along with smaller phrase technology. Any transcriptional errors that result from this process are unintentional.

## 2018-08-24 NOTE — Progress Notes (Signed)
MD made aware of pt.'s BP at this time is 171/109 and pt is complaining of a headache. Verbal order to give an extra dose of hydralazine 10 mg IV at this time. Pt is A/O x4.Transport is present to take pt for HIDA scan. Nuclear med was made aware of pt.'s BP at this time and that RN had administered IV hydralazine prior to pt being transported. Current CIWA score is 7, MD made aware, no new orders at this time.   Dinah Lupa [employer]

## 2018-08-24 NOTE — Progress Notes (Signed)
Verbal order to start pt on clear liquid diet at this time.   George Arellano [employer]

## 2018-08-24 NOTE — Progress Notes (Signed)
Anticoagulation monitoring(Lovenox):  53 yo male ordered Lovenox 30 mg Q24h  Filed Weights   08/23/18 1203 08/23/18 2026  Weight: 185 lb (83.9 kg) 174 lb 13.2 oz (79.3 kg)   BMI    Lab Results  Component Value Date   CREATININE 0.69 08/24/2018   CREATININE 0.80 08/23/2018   CREATININE 1.13 08/08/2018   Estimated Creatinine Clearance: 109.2 mL/min (by C-G formula based on SCr of 0.69 mg/dL). Hemoglobin & Hematocrit     Component Value Date/Time   HGB 15.7 08/24/2018 0428   HGB 14.6 06/01/2014 0706   HCT 45.3 08/24/2018 0428   HCT 42.7 06/01/2014 0706     Per Protocol for Patient with estCrcl > 30 ml/min and BMI < 40, will transition to Lovenox 40 mg Q24h.

## 2018-08-24 NOTE — Progress Notes (Signed)
MD made aware that pt.'s BP is 185/109 after PO antihypertensive and IV hydralazine PRN was given. No new orders given at this time. RN will continue to monitor closely. Pt is currently resting in his bed, the only complaint given was that he had a headache. MD also made aware about his headache. No verbal orders at this time.   Cj Beecher [employer]

## 2018-08-24 NOTE — Progress Notes (Signed)
Pt BP remained elevated. Primary nurse called and spoke to [facility].   Zack PA to place orders. Report giving to oncoming nurse with all questions answered.

## 2018-08-24 NOTE — Progress Notes (Signed)
Pt complaining of increased pain. Pt rating pain 7 out of 10. Primary nurse called and spoke to Dr. Rosana Hoes. Orders received for Toradol 15 mg once. Fentanyl 50 mg once.  Primary nurse to continue to monitor.

## 2018-08-24 NOTE — Progress Notes (Addendum)
Pt BP elevated at 227/129. Pt given IV ativan along with IV Toradol. Primary nurse called and spoke to Dr. Rosana Hoes with an update on pt. Orders received to give Lisinopril along with metoprolol early. Dr. Rosana Hoes also ordered IV rocephin to be given. Primary nurse to continue to monitor pt.

## 2018-08-25 DIAGNOSIS — Z888 Allergy status to other drugs, medicaments and biological substances status: Secondary | ICD-10-CM | POA: Diagnosis not present

## 2018-08-25 DIAGNOSIS — I482 Chronic atrial fibrillation, unspecified: Secondary | ICD-10-CM | POA: Diagnosis present

## 2018-08-25 DIAGNOSIS — J441 Chronic obstructive pulmonary disease with (acute) exacerbation: Secondary | ICD-10-CM | POA: Diagnosis present

## 2018-08-25 DIAGNOSIS — Z9981 Dependence on supplemental oxygen: Secondary | ICD-10-CM | POA: Diagnosis not present

## 2018-08-25 DIAGNOSIS — G8929 Other chronic pain: Secondary | ICD-10-CM | POA: Diagnosis present

## 2018-08-25 DIAGNOSIS — F10239 Alcohol dependence with withdrawal, unspecified: Secondary | ICD-10-CM | POA: Diagnosis present

## 2018-08-25 DIAGNOSIS — K760 Fatty (change of) liver, not elsewhere classified: Secondary | ICD-10-CM | POA: Diagnosis present

## 2018-08-25 DIAGNOSIS — R1011 Right upper quadrant pain: Secondary | ICD-10-CM | POA: Diagnosis present

## 2018-08-25 DIAGNOSIS — Z7982 Long term (current) use of aspirin: Secondary | ICD-10-CM | POA: Diagnosis not present

## 2018-08-25 DIAGNOSIS — F17210 Nicotine dependence, cigarettes, uncomplicated: Secondary | ICD-10-CM | POA: Diagnosis present

## 2018-08-25 DIAGNOSIS — I110 Hypertensive heart disease with heart failure: Secondary | ICD-10-CM | POA: Diagnosis present

## 2018-08-25 DIAGNOSIS — I509 Heart failure, unspecified: Secondary | ICD-10-CM | POA: Diagnosis present

## 2018-08-25 DIAGNOSIS — T380X5A Adverse effect of glucocorticoids and synthetic analogues, initial encounter: Secondary | ICD-10-CM | POA: Diagnosis not present

## 2018-08-25 DIAGNOSIS — Z8669 Personal history of other diseases of the nervous system and sense organs: Secondary | ICD-10-CM | POA: Diagnosis not present

## 2018-08-25 DIAGNOSIS — I69354 Hemiplegia and hemiparesis following cerebral infarction affecting left non-dominant side: Secondary | ICD-10-CM | POA: Diagnosis not present

## 2018-08-25 DIAGNOSIS — Z79899 Other long term (current) drug therapy: Secondary | ICD-10-CM | POA: Diagnosis not present

## 2018-08-25 DIAGNOSIS — F419 Anxiety disorder, unspecified: Secondary | ICD-10-CM | POA: Diagnosis present

## 2018-08-25 DIAGNOSIS — Z8249 Family history of ischemic heart disease and other diseases of the circulatory system: Secondary | ICD-10-CM | POA: Diagnosis not present

## 2018-08-25 DIAGNOSIS — K812 Acute cholecystitis with chronic cholecystitis: Secondary | ICD-10-CM | POA: Diagnosis present

## 2018-08-25 DIAGNOSIS — Z881 Allergy status to other antibiotic agents status: Secondary | ICD-10-CM | POA: Diagnosis not present

## 2018-08-25 DIAGNOSIS — K429 Umbilical hernia without obstruction or gangrene: Secondary | ICD-10-CM | POA: Diagnosis present

## 2018-08-25 DIAGNOSIS — K8020 Calculus of gallbladder without cholecystitis without obstruction: Secondary | ICD-10-CM | POA: Diagnosis not present

## 2018-08-25 LAB — BASIC METABOLIC PANEL
ANION GAP: 8 (ref 5–15)
BUN: 12 mg/dL (ref 6–20)
CO2: 28 mmol/L (ref 22–32)
Calcium: 8.8 mg/dL — ABNORMAL LOW (ref 8.9–10.3)
Chloride: 97 mmol/L — ABNORMAL LOW (ref 98–111)
Creatinine, Ser: 0.78 mg/dL (ref 0.61–1.24)
GFR calc Af Amer: >60 mL/min (ref >60)
GFR calc non Af Amer: >60 mL/min (ref >60)
Glucose, Bld: 123 mg/dL — ABNORMAL HIGH (ref 70–99)
Potassium: 3.6 mmol/L (ref 3.5–5.1)
Sodium: 133 mmol/L — ABNORMAL LOW (ref 135–145)

## 2018-08-25 LAB — HIV ANTIBODY (ROUTINE TESTING W REFLEX): HIV Screen 4th Generation wRfx: NONREACTIVE

## 2018-08-25 NOTE — Progress Notes (Signed)
[place] at [place] NAME: Ayiden Milliman    MR#:  [id number]  DATE OF BIRTH:  1966/02/22  SUBJECTIVE:  CHIEF COMPLAINT:   Chief Complaint  Patient presents with  . Abdominal Pain   No new complaint this morning.  Resting comfortably.  No fevers.  No abdominal pains this morning. REVIEW OF SYSTEMS:  Review of Systems  Constitutional: Negative for chills and fever.  HENT: Negative for hearing loss and tinnitus.   Eyes: Negative for blurred vision and double vision.  Respiratory: Negative for cough, hemoptysis and shortness of breath.   Cardiovascular: Negative for chest pain and palpitations.  Gastrointestinal: Negative for abdominal pain, heartburn, nausea and vomiting.  Genitourinary: Negative for dysuria and urgency.  Musculoskeletal: Negative for myalgias and neck pain.  Skin: Negative for itching and rash.  Neurological: Negative for dizziness and headaches.  Psychiatric/Behavioral: Negative for depression and hallucinations.      DRUG ALLERGIES:   Allergies  Allergen Reactions  . Atorvastatin Hives and Itching    All over trunk of body  . Levaquin [Levofloxacin] Hives and Other (See Comments)    Rash, itching all over trunk of body   VITALS:  Blood pressure 114/85, pulse (!) 121, temperature 99.8 F (37.7 C), temperature source Oral, resp. rate 20, height 5\' 9"  (1.753 m), weight 79.3 kg, SpO2 98 %. PHYSICAL EXAMINATION:   GENERAL:  53 y.o.-year-old patient lying in the bed with no acute distress.  EYES: Pupils equal, round, reactive to light and accommodation. No scleral icterus. Extraocular muscles intact.  HEENT: Head atraumatic, normocephalic. Oropharynx and nasopharynx clear.  NECK:  Supple, no jugular venous distention. No thyroid enlargement, no tenderness.  LUNGS: Normal breath sounds bilaterally, no wheezing, rales,rhonchi or crepitation. No use of accessory muscles of respiration.  CARDIOVASCULAR: S1, S2 normal.  No murmurs, rubs, or gallops.  ABDOMEN: Soft, mild tender upper abd but no rebound or guarding. Bowel sounds present. No organomegaly or mass.  EXTREMITIES: No pedal edema, cyanosis, or clubbing.  NEUROLOGIC: Cranial nerves II through XII are intact. Muscle strength 5/5 in all extremities. Sensation intact. Gait not checked.  PSYCHIATRIC: The patient is alert and oriented x 3.  SKIN: No obvious rash, lesion, or ulcer.    LABORATORY PANEL:  Male CBC Recent Labs  Lab 08/24/18 0428  WBC 7.7  HGB 15.7  HCT 45.3  PLT 145*   ------------------------------------------------------------------------------------------------------------------ Chemistries  Recent Labs  Lab 08/24/18 0428 08/25/18 0613  NA 138 133*  K 3.1* 3.6  CL 100 97*  CO2 30 28  GLUCOSE 117* 123*  BUN 8 12  CREATININE 0.69 0.78  CALCIUM 8.9 8.8*  AST 83*  --   ALT 52*  --   ALKPHOS 70  --   BILITOT 1.9*  --    RADIOLOGY:  Nm Hepatobiliary Liver Func  Result Date: 08/24/2018 CLINICAL DATA:  Abnormal ultrasound on 08/23/2018. Cholelithiasis and sonographic Murphy's sign. EXAM: NUCLEAR MEDICINE HEPATOBILIARY IMAGING TECHNIQUE: Sequential images of the abdomen were obtained out to 60 minutes following intravenous administration of radiopharmaceutical. RADIOPHARMACEUTICALS:  4.9 mCi Tc-54m  Choletec IV COMPARISON:  Ultrasound 09/21/2018 FINDINGS: Prompt uniform uptake within the liver. Counts are evident within the small bowel by 30 minutes. No radiotracer within the gallbladder by 1 hour. 3.1 mg IV morphine was administered to include the sphincter of Oddi. Patient image for additional 30 minutes without visualization of the gallbladder. IMPRESSION: Nonvisualization of the gallbladder is consistent with cystic duct obstruction and  ACUTE CHOLECYSTITIS. These results will be called to the ordering clinician or representative by the Radiologist Assistant, and communication documented in the PACS or zVision Dashboard.  Electronically Signed   By: Suzy Bouchard M.D.   On: 08/24/2018 15:28   ASSESSMENT AND PLAN:    1. Uncontrolled HTN Blood pressure better controlled this morning.  Continue lisinopril and metoprolol. PRN IV hydralazine with parameters.  2. Alcohol withdrawal Patient reported to have had some signs of alcohol withdrawal.  Already placed on alcohol withdrawal protocol. Continue p.o. thiamine, multivitamin and folic acid.  3.  Acute calculus cholecystitis Being managed by surgical team. Currently on IV antibiotics. Surgery scheduled for Monday afternoon. Said to have potentially fatal risk of cholecystectomy versus percutaneous cholecystostomy drain in the context of chronic alcohol associated liver disease per surgeon.. Plans for surgery once alcohol withdrawal over.  Recent echo and echocardiogram reviewed.  DVT prophylaxis; Lovenox   All the records are reviewed and case discussed with Care Management/Social Worker. Management plans discussed with the patient, family and they are in agreement.  CODE STATUS: Full Code  TOTAL TIME TAKING CARE OF THIS PATIENT: 35 minutes.   More than 50% of the time was spent in counseling/coordination of care: YES  POSSIBLE D/C IN 4 DAYS, DEPENDING ON CLINICAL CONDITION.   Nikai Quest M.D on 08/25/2018 at 2:11 PM  Between 7am to 6pm - Pager - [phone]  After 6pm go to [email or web address] - Proofreader  [facility]  Office  [phone]  CC: Primary care physician; System, Pcp Not In  Note: This dictation was prepared with Dragon dictation along with smaller phrase technology. Any transcriptional errors that result from this process are unintentional.

## 2018-08-25 NOTE — Progress Notes (Signed)
[facility] SURGICAL PROGRESS NOTE (cpt [id number])  Hospital Day(s): 0.   Post op day(s):  Marland Kitchen   Interval History: Patient seen and examined, no acute events or new complaints overnight. Patient reports improvement in RUQ abdominal pain but no fever, chills, nausea, or emesis. He denied any hallucination but does report tremors with mild improvement with CIWA. Tolerating clear liquids. Mobilizing.  Review of Systems:  Constitutional: denies fever, chills  Gastrointestinal: + abdominal pain, denide N/V, or diarrhea/and bowel function as per interval history Neurological: + Tremors, Denied hallucinations  Vital signs in last 24 hours: [min-max] current  Temp:  [98.8 F (37.1 C)-99.8 F (37.7 C)] 99.8 F (37.7 C) (02/14 0438) Pulse Rate:  [84-126] 121 (02/14 0438) Resp:  [20] 20 (02/14 0438) BP: (114-171)/(85-109) 114/85 (02/14 0438) SpO2:  [97 %-99 %] 98 % (02/14 0438)     Height: 5\' 9"  (175.3 cm) Weight: 79.3 kg BMI (Calculated): 25.81   Intake/Output this shift:  No intake/output data recorded.   Intake/Output last 2 shifts:  @IOLAST2SHIFTS @   Physical Exam:  Constitutional: alert, cooperative and no distress  HENT: normocephalic without obvious abnormality, on nasal cannula  Eyes: PERRL, EOM's grossly intact and symmetric  Respiratory: breathing non-labored at rest  Gastrointestinal: soft and protuberant (unchanged), but non-distended with mild RUQ abdominal tenderness to palpation Musculoskeletal: no edema or wounds, motor and sensation grossly intact, NT, SCDs bilaterally    Labs:  CBC Latest Ref Rng & Units 08/24/2018 08/23/2018 08/08/2018  WBC 4.0 - 10.5 K/uL 7.7 12.9(H) 8.6  Hemoglobin 13.0 - 17.0 g/dL 15.7 16.7 14.8  Hematocrit 39.0 - 52.0 % 45.3 47.6 43.8  Platelets 150 - 400 K/uL 145(L) 168 171   CMP Latest Ref Rng & Units 08/25/2018 08/24/2018 08/23/2018  Glucose 70 - 99 mg/dL 123(H) 117(H) 125(H)  BUN 6 - 20 mg/dL 12 8 7   Creatinine 0.61 - 1.24 mg/dL  0.78 0.69 0.80  Sodium 135 - 145 mmol/L 133(L) 138 143  Potassium 3.5 - 5.1 mmol/L 3.6 3.1(L) 4.4  Chloride 98 - 111 mmol/L 97(L) 100 101  CO2 22 - 32 mmol/L 28 30 29   Calcium 8.9 - 10.3 mg/dL 8.8(L) 8.9 10.1  Total Protein 6.5 - 8.1 g/dL - 7.0 8.4(H)  Total Bilirubin 0.3 - 1.2 mg/dL - 1.9(H) 1.6(H)  Alkaline Phos 38 - 126 U/L - 70 87  AST 15 - 41 U/L - 83(H) 103(H)  ALT 0 - 44 U/L - 52(H) 60(H)     Imaging studies: No new pertinent imaging studies   Assessment/Plan: (ICD-10's: K80.20 +/- K70.31 vs K80.01) In summary, patient is a 53 y.o. male with acute calculous cholecystitis, complicated by resolved leukocytosis, chronic hyperbilirubinemia (since ~2015),withdrawal from severe chronic alcohol abuse/dependence, and comorbidities including HTN, COPD (on qhs home supplemental oxygen), CHF, Left hemiparesis s/p stroke, chronicatrial fibrillation on full dose ASA without therapeutic anticoagulation, seizuredisorder,generalized anxiety disorder, and chronic ongoingtobacco and marijuana abuse (smoking).   - Continue clear liquids diet             - Continue CIWA protocol for alcohol withdrawal             - medical consultation appreciated for medical management and risk stratification/optimization             - significant potentially fatal risks of cholecystectomy vs percutaneous cholecystostomy drain (in context of developing ascites associated with chronic alcohol-associated cirrhosis/liver disease) vs comfort-oriented management were discussed at length with patient, who expresses his preference to proceed with  cholecystectomy pending further medical optimization and alcohol detoxification per Dr. Darvin Neighbours (with whom LVEF and labs were discussed), and informed consent was obtained             - patient tentatively added to OR schedule for Monday afternoon             - continue ceftriaxone, pain control, and anti-emetic prn             - DVT prophylaxis, ambulation encouraged  All of  the above findings and recommendations were discussed with the patient, and the medical team, and all of patient's questions were answered to his expressed satisfaction.  -- Edison Simon, PA-C [facility] 08/25/2018, 10:36 AM [phone] M-F: 7am - 4pm

## 2018-08-26 LAB — COMPREHENSIVE METABOLIC PANEL
ALT: 34 U/L (ref 0–44)
AST: 37 U/L (ref 15–41)
Albumin: 3.5 g/dL (ref 3.5–5.0)
Alkaline Phosphatase: 70 U/L (ref 38–126)
Anion gap: 9 (ref 5–15)
BILIRUBIN TOTAL: 2.1 mg/dL — AB (ref 0.3–1.2)
BUN: 14 mg/dL (ref 6–20)
CO2: 27 mmol/L (ref 22–32)
Calcium: 8.9 mg/dL (ref 8.9–10.3)
Chloride: 97 mmol/L — ABNORMAL LOW (ref 98–111)
Creatinine, Ser: 0.78 mg/dL (ref 0.61–1.24)
GFR calc Af Amer: >60 mL/min (ref >60)
GFR calc non Af Amer: >60 mL/min (ref >60)
Glucose, Bld: 100 mg/dL — ABNORMAL HIGH (ref 70–99)
Potassium: 3.6 mmol/L (ref 3.5–5.1)
Sodium: 133 mmol/L — ABNORMAL LOW (ref 135–145)
Total Protein: 7.1 g/dL (ref 6.5–8.1)

## 2018-08-26 LAB — CBC
HEMATOCRIT: 46.5 % (ref 39.0–52.0)
Hemoglobin: 16 g/dL (ref 13.0–17.0)
MCH: 34.9 pg — ABNORMAL HIGH (ref 26.0–34.0)
MCHC: 34.4 g/dL (ref 30.0–36.0)
MCV: 101.3 fL — ABNORMAL HIGH (ref 80.0–100.0)
Platelets: 159 10*3/uL (ref 150–400)
RBC: 4.59 MIL/uL (ref 4.22–5.81)
RDW: 11.5 % (ref 11.5–15.5)
WBC: 15.8 10*3/uL — ABNORMAL HIGH (ref 4.0–10.5)
nRBC: 0 % (ref 0.0–0.2)

## 2018-08-26 LAB — MAGNESIUM: Magnesium: 1.9 mg/dL (ref 1.7–2.4)

## 2018-08-26 MED ORDER — AZITHROMYCIN 250 MG PO TABS
500.0000 mg | ORAL_TABLET | Freq: Every day | ORAL | Status: AC
  Administered 2018-08-26: 500 mg via ORAL
  Filled 2018-08-26: qty 2

## 2018-08-26 MED ORDER — POLYETHYLENE GLYCOL 3350 17 G PO PACK
17.0000 g | PACK | Freq: Once | ORAL | Status: AC
  Administered 2018-08-26: 17 g via ORAL
  Filled 2018-08-26: qty 1

## 2018-08-26 MED ORDER — IPRATROPIUM-ALBUTEROL 0.5-2.5 (3) MG/3ML IN SOLN
3.0000 mL | Freq: Four times a day (QID) | RESPIRATORY_TRACT | Status: DC
  Administered 2018-08-26 – 2018-08-27 (×5): 3 mL via RESPIRATORY_TRACT
  Filled 2018-08-26 (×5): qty 3

## 2018-08-26 MED ORDER — AZITHROMYCIN 250 MG PO TABS
250.0000 mg | ORAL_TABLET | Freq: Every day | ORAL | Status: AC
  Administered 2018-08-27 – 2018-08-30 (×2): 250 mg via ORAL
  Filled 2018-08-26 (×3): qty 1

## 2018-08-26 MED ORDER — POLYETHYLENE GLYCOL 3350 17 G PO PACK: 17.0000 g | PACK | Freq: Every day | ORAL | Status: DC

## 2018-08-26 MED ORDER — METHYLPREDNISOLONE SODIUM SUCC 125 MG IJ SOLR
60.0000 mg | INTRAMUSCULAR | Status: DC
  Administered 2018-08-26 – 2018-08-27 (×2): 60 mg via INTRAVENOUS
  Filled 2018-08-26 (×2): qty 2

## 2018-08-26 NOTE — Progress Notes (Signed)
Subjective:  CC:  George Arellano is a 53 y.o. male  Hospital stay day 1,   acute cholecystitis chronichyperbilirubinemia(since ~2015),withdrawal fromsevere chronic alcohol abuse/dependence, andcomorbidities including HTN, COPD (onqhshome supplemental oxygen), CHF, Left hemiparesis s/p stroke, chronicatrial fibrillation on full dose ASA without therapeutic anticoagulation, seizuredisorder,generalized anxiety disorder, and chronic ongoingtobaccoand marijuanaabuse (smoking).  HPI: No issues overnight. Still has some shaking  ROS:  General: Denies weight loss, weight gain, fatigue, fevers, chills, and night sweats. Heart: Denies chest pain, palpitations, racing heart, irregular heartbeat, leg pain or swelling, and decreased activity tolerance. Respiratory: Denies breathing difficulty, shortness of breath, wheezing, cough, and sputum. GI: Denies change in appetite, heartburn, nausea, vomiting, constipation, diarrhea, and blood in stool. GU: Denies difficulty urinating, pain with urinating, urgency, frequency, blood in urine.   Objective:      Temp:  [99.2 F (37.3 C)-101.3 F (38.5 C)] 99.2 F (37.3 C) (02/15 0449) Pulse Rate:  [104-119] 104 (02/15 1023) Resp:  [12-20] 20 (02/15 0449) BP: (108-118)/(76-78) 111/78 (02/15 1023) SpO2:  [98 %-100 %] 100 % (02/15 0449)     Height: 5\' 9"  (175.3 cm) Weight: 79.3 kg BMI (Calculated): 25.81   Intake/Output this shift:   Intake/Output Summary (Last 24 hours) at 08/26/2018 1044 Last data filed at 08/25/2018 1854 Gross per 24 hour  Intake 360 ml  Output -  Net 360 ml        Constitutional :  alert, cooperative, appears stated age and no distress  Respiratory:  clear to auscultation bilaterally  Cardiovascular:  irregularly irregular rhythm  Gastrointestinal: soft, focal tenderness in RUQ.   Skin: Cool and moist.   Psychiatric: Normal affect, non-agitated, not confused       LABS:  CMP Latest Ref Rng & Units 08/26/2018  08/25/2018 08/24/2018  Glucose 70 - 99 mg/dL 100(H) 123(H) 117(H)  BUN 6 - 20 mg/dL 14 12 8   Creatinine 0.61 - 1.24 mg/dL 0.78 0.78 0.69  Sodium 135 - 145 mmol/L 133(L) 133(L) 138  Potassium 3.5 - 5.1 mmol/L 3.6 3.6 3.1(L)  Chloride 98 - 111 mmol/L 97(L) 97(L) 100  CO2 22 - 32 mmol/L 27 28 30   Calcium 8.9 - 10.3 mg/dL 8.9 8.8(L) 8.9  Total Protein 6.5 - 8.1 g/dL 7.1 - 7.0  Total Bilirubin 0.3 - 1.2 mg/dL 2.1(H) - 1.9(H)  Alkaline Phos 38 - 126 U/L 70 - 70  AST 15 - 41 U/L 37 - 83(H)  ALT 0 - 44 U/L 34 - 52(H)   CBC Latest Ref Rng & Units 08/26/2018 08/24/2018 08/23/2018  WBC 4.0 - 10.5 K/uL 15.8(H) 7.7 12.9(H)  Hemoglobin 13.0 - 17.0 g/dL 16.0 15.7 16.7  Hematocrit 39.0 - 52.0 % 46.5 45.3 47.6  Platelets 150 - 400 K/uL 159 145(L) 168    RADS: n/a Assessment:   acute cholecystitis chronichyperbilirubinemia(since ~2015),withdrawal fromsevere chronic alcohol abuse/dependence, andcomorbidities including HTN, COPD (onqhshome supplemental oxygen), CHF, Left hemiparesis s/p stroke, chronicatrial fibrillation on full dose ASA without therapeutic anticoagulation, seizuredisorder,generalized anxiety disorder, and chronic ongoingtobaccoand marijuanaabuse (smoking).  Continue current supportive care per hospitalist.  tentatively still scheduled for lap chole on Monday.  Increased wbc noted, no clinical sign of worsening cholecystitis and other possible infections for now.  Consider blood cultures if febrile in the next 24hrs.

## 2018-08-26 NOTE — Progress Notes (Signed)
[place] at [place] NAME: George Arellano    MR#:  [id number]  DATE OF BIRTH:  26-Sep-1965  SUBJECTIVE:  CHIEF COMPLAINT:   Chief Complaint  Patient presents with  . Abdominal Pain   Patient still has right upper quadrant pain but denies any other complaints, has some wheezing, smoker's cough. REVIEW OF SYSTEMS:  Review of Systems  Constitutional: Negative for chills and fever.  HENT: Negative for hearing loss and tinnitus.   Eyes: Negative for blurred vision and double vision.  Respiratory: Negative for cough, hemoptysis and shortness of breath.   Cardiovascular: Negative for chest pain and palpitations.  Gastrointestinal: Positive for abdominal pain. Negative for heartburn, nausea and vomiting.  Genitourinary: Negative for dysuria and urgency.  Musculoskeletal: Negative for myalgias and neck pain.  Skin: Negative for itching and rash.  Neurological: Negative for dizziness and headaches.  Psychiatric/Behavioral: Negative for depression and hallucinations.      DRUG ALLERGIES:   Allergies  Allergen Reactions  . Atorvastatin Hives and Itching    All over trunk of body  . Levaquin [Levofloxacin] Hives and Other (See Comments)    Rash, itching all over trunk of body   VITALS:  Blood pressure 111/78, pulse (!) 104, temperature 99.2 F (37.3 C), temperature source Oral, resp. rate 20, height 5\' 9"  (1.753 m), weight 79.3 kg, SpO2 100 %. PHYSICAL EXAMINATION:   GENERAL:  53 y.o.-year-old patient lying in the bed with no acute distress.  EYES: Pupils equal, round, reactive to light and accommodation. No scleral icterus. Extraocular muscles intact.  HEENT: Head atraumatic, normocephalic. Oropharynx and nasopharynx clear.  NECK:  Supple, no jugular venous distention. No thyroid enlargement, no tenderness.  LUNGS:  faint wheezing bilaterally. CARDIOVASCULAR: S1, S2 normal. No murmurs, rubs, or gallops.  ABDOMEN: Soft, mild tender right  upper abd but no rebound or guarding. Bowel sounds present. No organomegaly or mass.  EXTREMITIES: No pedal edema, cyanosis, or clubbing.  NEUROLOGIC: Cranial nerves II through XII are intact. Muscle strength 5/5 in all extremities. Sensation intact. Gait not checked.  PSYCHIATRIC: The patient is alert and oriented x 3.  SKIN: No obvious rash, lesion, or ulcer.    LABORATORY PANEL:  Male CBC Recent Labs  Lab 08/26/18 0453  WBC 15.8*  HGB 16.0  HCT 46.5  PLT 159   ------------------------------------------------------------------------------------------------------------------ Chemistries  Recent Labs  Lab 08/26/18 0453  NA 133*  K 3.6  CL 97*  CO2 27  GLUCOSE 100*  BUN 14  CREATININE 0.78  CALCIUM 8.9  MG 1.9  AST 37  ALT 34  ALKPHOS 70  BILITOT 2.1*   RADIOLOGY:  No results found. ASSESSMENT AND PLAN:    1. Uncontrolled HTN Blood pressure better controlled now,.  Continue lisinopril and metoprolol. PRN IV hydralazine with parameters.  2. Alcohol withdrawal Patient reported to have had some signs of alcohol withdrawal.  Already placed on alcohol withdrawal protocol. Continue p.o. thiamine, multivitamin and folic acid.  3.  Acute calculus cholecystitis Being managed by surgical team. Currently on IV antibiotics.  Leukocytosis is worse today along with low-grade temperature, continue IV antibiotics Surgery scheduled for Monday afternoon.Plans for surgery once alcohol withdrawal over.  Recent echo and echocardiogram reviewed. #4 .  COPD exacerbation, patient wheezing today states that he takes duo nebs at home, start duo nebs, small dose IV steroids.  Add azithromycin also.   DVT prophylaxis; Lovenox   All the records are reviewed and case discussed with Care  Management/Social Worker. Management plans discussed with the patient, family and they are in agreement.  CODE STATUS: Full Code  TOTAL TIME TAKING CARE OF THIS PATIENT: 35 minutes.   More than 50%  of the time was spent in counseling/coordination of care: YES  POSSIBLE D/C IN 4 DAYS, DEPENDING ON CLINICAL CONDITION.   Epifanio Lesches M.D on 08/26/2018 at 12:51 PM  Between 7am to 6pm - Pager - [phone]  After 6pm go to [email or web address] - Proofreader  [facility]  Office  [phone]  CC: Primary care physician; System, Pcp Not In  Note: This dictation was prepared with Dragon dictation along with smaller phrase technology. Any transcriptional errors that result from this process are unintentional.

## 2018-08-26 NOTE — Plan of Care (Signed)
Stable. No ativan required. Voiding. Has not had a BM and miralax has been ordered. Awaiting for procedure.  Problem: Education: Goal: Knowledge of General Education information will improve Description Including pain rating scale, medication(s)/side effects and non-pharmacologic comfort measures Outcome: Progressing   Problem: Health Behavior/Discharge Planning: Goal: Ability to manage health-related needs will improve Outcome: Progressing   Problem: Clinical Measurements: Goal: Ability to maintain clinical measurements within normal limits will improve Outcome: Progressing Goal: Will remain free from infection Outcome: Progressing Goal: Diagnostic test results will improve Outcome: Progressing Goal: Respiratory complications will improve Outcome: Progressing Goal: Cardiovascular complication will be avoided Outcome: Progressing   Problem: Activity: Goal: Risk for activity intolerance will decrease Outcome: Progressing   Problem: Nutrition: Goal: Adequate nutrition will be maintained Outcome: Progressing   Problem: Coping: Goal: Level of anxiety will decrease Outcome: Progressing   Problem: Elimination: Goal: Will not experience complications related to bowel motility Outcome: Progressing Goal: Will not experience complications related to urinary retention Outcome: Progressing   Problem: Pain Managment: Goal: General experience of comfort will improve Outcome: Progressing   Problem: Safety: Goal: Ability to remain free from injury will improve Outcome: Progressing   Problem: Skin Integrity: Goal: Risk for impaired skin integrity will decrease Outcome: Progressing

## 2018-08-27 LAB — COMPREHENSIVE METABOLIC PANEL
ALT: 55 U/L — ABNORMAL HIGH (ref 0–44)
AST: 64 U/L — ABNORMAL HIGH (ref 15–41)
Albumin: 3.2 g/dL — ABNORMAL LOW (ref 3.5–5.0)
Alkaline Phosphatase: 83 U/L (ref 38–126)
Anion gap: 8 (ref 5–15)
BUN: 13 mg/dL (ref 6–20)
CO2: 27 mmol/L (ref 22–32)
Calcium: 9 mg/dL (ref 8.9–10.3)
Chloride: 98 mmol/L (ref 98–111)
Creatinine, Ser: 0.82 mg/dL (ref 0.61–1.24)
GFR calc Af Amer: >60 mL/min (ref >60)
GFR calc non Af Amer: >60 mL/min (ref >60)
Glucose, Bld: 147 mg/dL — ABNORMAL HIGH (ref 70–99)
Potassium: 4.7 mmol/L (ref 3.5–5.1)
Sodium: 133 mmol/L — ABNORMAL LOW (ref 135–145)
Total Bilirubin: 1.4 mg/dL — ABNORMAL HIGH (ref 0.3–1.2)
Total Protein: 6.9 g/dL (ref 6.5–8.1)

## 2018-08-27 LAB — CBC
HCT: 41.4 % (ref 39.0–52.0)
HEMOGLOBIN: 13.9 g/dL (ref 13.0–17.0)
MCH: 34.4 pg — ABNORMAL HIGH (ref 26.0–34.0)
MCHC: 33.6 g/dL (ref 30.0–36.0)
MCV: 102.5 fL — ABNORMAL HIGH (ref 80.0–100.0)
Platelets: 179 10*3/uL (ref 150–400)
RBC: 4.04 MIL/uL — ABNORMAL LOW (ref 4.22–5.81)
RDW: 11.1 % — ABNORMAL LOW (ref 11.5–15.5)
WBC: 10.1 10*3/uL (ref 4.0–10.5)
nRBC: 0 % (ref 0.0–0.2)

## 2018-08-27 LAB — MAGNESIUM: Magnesium: 2.1 mg/dL (ref 1.7–2.4)

## 2018-08-27 MED ORDER — CHLORHEXIDINE GLUCONATE CLOTH 2 % EX PADS
6.0000 | MEDICATED_PAD | Freq: Once | CUTANEOUS | Status: AC
  Administered 2018-08-28: 6 via TOPICAL

## 2018-08-27 MED ORDER — IPRATROPIUM-ALBUTEROL 0.5-2.5 (3) MG/3ML IN SOLN
3.0000 mL | Freq: Three times a day (TID) | RESPIRATORY_TRACT | Status: DC
  Administered 2018-08-28: 3 mL via RESPIRATORY_TRACT
  Filled 2018-08-27: qty 3

## 2018-08-27 NOTE — Progress Notes (Signed)
[place] at [place] NAME: Markee Matera    MR#:  [id number]  DATE OF BIRTH:  [birth year]  SUBJECTIVE:  CHIEF COMPLAINT:   Chief Complaint  Patient presents with  . Abdominal Pain   Less wheezing today, has right upper quadrant pain.Marland Kitchen REVIEW OF SYSTEMS:  Review of Systems  Constitutional: Negative for chills and fever.  HENT: Negative for hearing loss and tinnitus.   Eyes: Negative for blurred vision and double vision.  Respiratory: Negative for cough, hemoptysis and shortness of breath.   Cardiovascular: Negative for chest pain and palpitations.  Gastrointestinal: Positive for abdominal pain. Negative for heartburn, nausea and vomiting.  Genitourinary: Negative for dysuria and urgency.  Musculoskeletal: Negative for myalgias and neck pain.  Skin: Negative for itching and rash.  Neurological: Negative for dizziness and headaches.  Psychiatric/Behavioral: Negative for depression and hallucinations.      DRUG ALLERGIES:   Allergies  Allergen Reactions  . Atorvastatin Hives and Itching    All over trunk of body  . Levaquin [Levofloxacin] Hives and Other (See Comments)    Rash, itching all over trunk of body   VITALS:  Blood pressure 104/70, pulse 85, temperature 97.8 F (36.6 C), temperature source Oral, resp. rate 14, height 5\' 9"  (1.753 m), weight 79.3 kg, SpO2 98 %. PHYSICAL EXAMINATION:   GENERAL:  53 y.o.-year-old patient lying in the bed with no acute distress.  EYES: Pupils equal, round, reactive to light and accommodation. No scleral icterus. Extraocular muscles intact.  HEENT: Head atraumatic, normocephalic. Oropharynx and nasopharynx clear.  NECK:  Supple, no jugular venous distention. No thyroid enlargement, no tenderness.  LUNGS: Less wheezing today.  CARDIOVASCULAR: S1, S2 normal. No murmurs, rubs, or gallops.  ABDOMEN: Soft, mild tender right upper abd but no rebound or guarding. Bowel sounds present. No  organomegaly or mass.  EXTREMITIES: No pedal edema, cyanosis, or clubbing.  NEUROLOGIC: Cranial nerves II through XII are intact. Muscle strength 5/5 in all extremities. Sensation intact. Gait not checked.  PSYCHIATRIC: The patient is alert and oriented x 3.  SKIN: No obvious rash, lesion, or ulcer.    LABORATORY PANEL:  Male CBC Recent Labs  Lab 08/27/18 0339  WBC 10.1  HGB 13.9  HCT 41.4  PLT 179   ------------------------------------------------------------------------------------------------------------------ Chemistries  Recent Labs  Lab 08/27/18 0339  NA 133*  K 4.7  CL 98  CO2 27  GLUCOSE 147*  BUN 13  CREATININE 0.82  CALCIUM 9.0  MG 2.1  AST 64*  ALT 55*  ALKPHOS 83  BILITOT 1.4*   RADIOLOGY:  No results found. ASSESSMENT AND PLAN:    1. Uncontrolled HTN Blood pressure better controlled now,.  Continue lisinopril and metoprolol. PRN IV hydralazine with parameters.  2. Alcohol withdrawal Patient reported to have had some signs of alcohol withdrawal.  Already placed on alcohol withdrawal protocol. Continue p.o. thiamine, multivitamin and folic acid.  3.  Acute calculus cholecystitis Being managed by surgical team. Currently on IV antibiotics.  Leukocytosis is worse today along with low-grade temperature, continue IV antibiotics Surgery scheduled for Monday afternoon.Plans for surgery once alcohol withdrawal over.  Recent echo and echocardiogram reviewed. #4 .  COPD exacerbation,im proving, continue bronchodilators, steroids, IV Rocephin, Zithromax. DVT prophylaxis; Lovenox   All the records are reviewed and case discussed with Care Management/Social Worker. Management plans discussed with the patient, family and they are in agreement.  CODE STATUS: Full Code  TOTAL TIME TAKING CARE OF THIS PATIENT:  35 minutes.   More than 50% of the time was spent in counseling/coordination of care: YES  POSSIBLE D/C IN 4 DAYS, DEPENDING ON CLINICAL  CONDITION.   Epifanio Lesches M.D on 08/27/2018 at 11:09 AM  Between 7am to 6pm - Pager - [phone]  After 6pm go to [email or web address] - Proofreader  [facility]  Office  [phone]  CC: Primary care physician; System, Pcp Not In  Note: This dictation was prepared with Dragon dictation along with smaller phrase technology. Any transcriptional errors that result from this process are unintentional.

## 2018-08-27 NOTE — Progress Notes (Signed)
Subjective:  CC:  George Arellano is a 53 y.o. male  Hospital stay day 2,   acute cholecystitis chronichyperbilirubinemia(since ~2015),withdrawal fromsevere chronic alcohol abuse/dependence, andcomorbidities including HTN, COPD (onqhshome supplemental oxygen), CHF, Left hemiparesis s/p stroke, chronicatrial fibrillation on full dose ASA without therapeutic anticoagulation, seizuredisorder,generalized anxiety disorder, and chronic ongoingtobaccoand marijuanaabuse (smoking).  HPI: No issues overnight.   ROS:  General: Denies weight loss, weight gain, fatigue, fevers, chills, and night sweats. Heart: Denies chest pain, palpitations, racing heart, irregular heartbeat, leg pain or swelling, and decreased activity tolerance. Respiratory: Denies breathing difficulty, shortness of breath, wheezing, cough, and sputum. GI: Denies change in appetite, heartburn, nausea, vomiting, constipation, diarrhea, and blood in stool. GU: Denies difficulty urinating, pain with urinating, urgency, frequency, blood in urine.   Objective:      Temp:  [97.8 F (36.6 C)-98.9 F (37.2 C)] 98.3 F (36.8 C) (02/16 1141) Pulse Rate:  [85-99] 99 (02/16 1141) Resp:  [14-18] 18 (02/16 1141) BP: (88-109)/(68-78) 88/68 (02/16 1141) SpO2:  [96 %-100 %] 100 % (02/16 1141)     Height: 5\' 9"  (175.3 cm) Weight: 79.3 kg BMI (Calculated): 25.81   Intake/Output this shift:   Intake/Output Summary (Last 24 hours) at 08/27/2018 1421 Last data filed at 08/27/2018 0800 Gross per 24 hour  Intake 545 ml  Output -  Net 545 ml    Constitutional :  alert, cooperative, appears stated age and no distress  Respiratory:  clear to auscultation bilaterally  Cardiovascular:  irregularly irregular rhythm  Gastrointestinal: soft, focal tenderness in RUQ.   Skin: Cool and moist.   Psychiatric: Normal affect, non-agitated, not confused       LABS:  CMP Latest Ref Rng & Units 08/27/2018 08/26/2018 08/25/2018  Glucose 70 - 99 mg/dL  147(H) 100(H) 123(H)  BUN 6 - 20 mg/dL 13 14 12   Creatinine 0.61 - 1.24 mg/dL 0.82 0.78 0.78  Sodium 135 - 145 mmol/L 133(L) 133(L) 133(L)  Potassium 3.5 - 5.1 mmol/L 4.7 3.6 3.6  Chloride 98 - 111 mmol/L 98 97(L) 97(L)  CO2 22 - 32 mmol/L 27 27 28   Calcium 8.9 - 10.3 mg/dL 9.0 8.9 8.8(L)  Total Protein 6.5 - 8.1 g/dL 6.9 7.1 -  Total Bilirubin 0.3 - 1.2 mg/dL 1.4(H) 2.1(H) -  Alkaline Phos 38 - 126 U/L 83 70 -  AST 15 - 41 U/L 64(H) 37 -  ALT 0 - 44 U/L 55(H) 34 -   CBC Latest Ref Rng & Units 08/27/2018 08/26/2018 08/24/2018  WBC 4.0 - 10.5 K/uL 10.1 15.8(H) 7.7  Hemoglobin 13.0 - 17.0 g/dL 13.9 16.0 15.7  Hematocrit 39.0 - 52.0 % 41.4 46.5 45.3  Platelets 150 - 400 K/uL 179 159 145(L)    RADS: n/a Assessment:   acute cholecystitis chronichyperbilirubinemia(since ~2015),withdrawal fromsevere chronic alcohol abuse/dependence, andcomorbidities including HTN, COPD (onqhshome supplemental oxygen), CHF, Left hemiparesis s/p stroke, chronicatrial fibrillation on full dose ASA without therapeutic anticoagulation, seizuredisorder,generalized anxiety disorder, and chronic ongoingtobaccoand marijuanaabuse (smoking).  Continue current supportive care per hospitalist.  tentatively still scheduled for lap chole on Monday. Wbc and total bili continuing to downtrend

## 2018-08-27 NOTE — H&P (View-Only) (Signed)
Subjective:  CC:  George Arellano is a 53 y.o. male  Hospital stay day 2,   acute cholecystitis chronichyperbilirubinemia(since ~2015),withdrawal fromsevere chronic alcohol abuse/dependence, andcomorbidities including HTN, COPD (onqhshome supplemental oxygen), CHF, Left hemiparesis s/p stroke, chronicatrial fibrillation on full dose ASA without therapeutic anticoagulation, seizuredisorder,generalized anxiety disorder, and chronic ongoingtobaccoand marijuanaabuse (smoking).  HPI: No issues overnight.   ROS:  General: Denies weight loss, weight gain, fatigue, fevers, chills, and night sweats. Heart: Denies chest pain, palpitations, racing heart, irregular heartbeat, leg pain or swelling, and decreased activity tolerance. Respiratory: Denies breathing difficulty, shortness of breath, wheezing, cough, and sputum. GI: Denies change in appetite, heartburn, nausea, vomiting, constipation, diarrhea, and blood in stool. GU: Denies difficulty urinating, pain with urinating, urgency, frequency, blood in urine.   Objective:      Temp:  [97.8 F (36.6 C)-98.9 F (37.2 C)] 98.3 F (36.8 C) (02/16 1141) Pulse Rate:  [85-99] 99 (02/16 1141) Resp:  [14-18] 18 (02/16 1141) BP: (88-109)/(68-78) 88/68 (02/16 1141) SpO2:  [96 %-100 %] 100 % (02/16 1141)     Height: 5\' 9"  (175.3 cm) Weight: 79.3 kg BMI (Calculated): 25.81   Intake/Output this shift:   Intake/Output Summary (Last 24 hours) at 08/27/2018 1421 Last data filed at 08/27/2018 0800 Gross per 24 hour  Intake 545 ml  Output -  Net 545 ml    Constitutional :  alert, cooperative, appears stated age and no distress  Respiratory:  clear to auscultation bilaterally  Cardiovascular:  irregularly irregular rhythm  Gastrointestinal: soft, focal tenderness in RUQ.   Skin: Cool and moist.   Psychiatric: Normal affect, non-agitated, not confused       LABS:  CMP Latest Ref Rng & Units 08/27/2018 08/26/2018 08/25/2018  Glucose 70 - 99 mg/dL  147(H) 100(H) 123(H)  BUN 6 - 20 mg/dL 13 14 12   Creatinine 0.61 - 1.24 mg/dL 0.82 0.78 0.78  Sodium 135 - 145 mmol/L 133(L) 133(L) 133(L)  Potassium 3.5 - 5.1 mmol/L 4.7 3.6 3.6  Chloride 98 - 111 mmol/L 98 97(L) 97(L)  CO2 22 - 32 mmol/L 27 27 28   Calcium 8.9 - 10.3 mg/dL 9.0 8.9 8.8(L)  Total Protein 6.5 - 8.1 g/dL 6.9 7.1 -  Total Bilirubin 0.3 - 1.2 mg/dL 1.4(H) 2.1(H) -  Alkaline Phos 38 - 126 U/L 83 70 -  AST 15 - 41 U/L 64(H) 37 -  ALT 0 - 44 U/L 55(H) 34 -   CBC Latest Ref Rng & Units 08/27/2018 08/26/2018 08/24/2018  WBC 4.0 - 10.5 K/uL 10.1 15.8(H) 7.7  Hemoglobin 13.0 - 17.0 g/dL 13.9 16.0 15.7  Hematocrit 39.0 - 52.0 % 41.4 46.5 45.3  Platelets 150 - 400 K/uL 179 159 145(L)    RADS: n/a Assessment:   acute cholecystitis chronichyperbilirubinemia(since ~2015),withdrawal fromsevere chronic alcohol abuse/dependence, andcomorbidities including HTN, COPD (onqhshome supplemental oxygen), CHF, Left hemiparesis s/p stroke, chronicatrial fibrillation on full dose ASA without therapeutic anticoagulation, seizuredisorder,generalized anxiety disorder, and chronic ongoingtobaccoand marijuanaabuse (smoking).  Continue current supportive care per hospitalist.  tentatively still scheduled for lap chole on Monday. Wbc and total bili continuing to downtrend

## 2018-08-28 ENCOUNTER — Inpatient Hospital Stay: Payer: Medicaid Other | Admitting: Anesthesiology

## 2018-08-28 ENCOUNTER — Encounter
Admission: EM | Disposition: A | Discharge status: Home / Self Care | Payer: Self-pay | Source: Home / Self Care | Attending: Surgery

## 2018-08-28 DIAGNOSIS — K812 Acute cholecystitis with chronic cholecystitis: Principal | ICD-10-CM

## 2018-08-28 DIAGNOSIS — K429 Umbilical hernia without obstruction or gangrene: Secondary | ICD-10-CM

## 2018-08-28 HISTORY — PX: CHOLECYSTECTOMY: SHX55

## 2018-08-28 LAB — COMPREHENSIVE METABOLIC PANEL
ALBUMIN: 3.1 g/dL — AB (ref 3.5–5.0)
ALK PHOS: 82 U/L (ref 38–126)
ALT: 77 U/L — ABNORMAL HIGH (ref 0–44)
AST: 76 U/L — ABNORMAL HIGH (ref 15–41)
Anion gap: 10 (ref 5–15)
BUN: 15 mg/dL (ref 6–20)
CO2: 24 mmol/L (ref 22–32)
Calcium: 8.9 mg/dL (ref 8.9–10.3)
Chloride: 99 mmol/L (ref 98–111)
Creatinine, Ser: 0.88 mg/dL (ref 0.61–1.24)
GFR calc Af Amer: >60 mL/min (ref >60)
GFR calc non Af Amer: >60 mL/min (ref >60)
GLUCOSE: 133 mg/dL — AB (ref 70–99)
Potassium: 4.4 mmol/L (ref 3.5–5.1)
Sodium: 133 mmol/L — ABNORMAL LOW (ref 135–145)
Total Bilirubin: 0.7 mg/dL (ref 0.3–1.2)
Total Protein: 6.5 g/dL (ref 6.5–8.1)

## 2018-08-28 LAB — CBC
HCT: 38.2 % — ABNORMAL LOW (ref 39.0–52.0)
Hemoglobin: 13.2 g/dL (ref 13.0–17.0)
MCH: 34.5 pg — ABNORMAL HIGH (ref 26.0–34.0)
MCHC: 34.6 g/dL (ref 30.0–36.0)
MCV: 99.7 fL (ref 80.0–100.0)
Platelets: 204 10*3/uL (ref 150–400)
RBC: 3.83 MIL/uL — ABNORMAL LOW (ref 4.22–5.81)
RDW: 11.2 % — ABNORMAL LOW (ref 11.5–15.5)
WBC: 9.5 10*3/uL (ref 4.0–10.5)
nRBC: 0 % (ref 0.0–0.2)

## 2018-08-28 LAB — URINE DRUG SCREEN, QUALITATIVE (ARMC ONLY)
Amphetamines, Ur Screen: NOT DETECTED
Barbiturates, Ur Screen: NOT DETECTED
Benzodiazepine, Ur Scrn: NOT DETECTED
Cannabinoid 50 Ng, Ur ~~LOC~~: POSITIVE — AB
Cocaine Metabolite,Ur ~~LOC~~: NOT DETECTED
MDMA (Ecstasy)Ur Screen: NOT DETECTED
Methadone Scn, Ur: NOT DETECTED
Opiate, Ur Screen: NOT DETECTED
Phencyclidine (PCP) Ur S: NOT DETECTED
TRICYCLIC, UR SCREEN: NOT DETECTED

## 2018-08-28 SURGERY — LAPAROSCOPIC CHOLECYSTECTOMY
Anesthesia: General | Site: Abdomen

## 2018-08-28 MED ORDER — LACTATED RINGERS IV SOLN
INTRAVENOUS | Status: DC | PRN
  Administered 2018-08-28: 20:00:00 via INTRAVENOUS

## 2018-08-28 MED ORDER — PHENYLEPHRINE HCL 10 MG/ML IJ SOLN
INTRAMUSCULAR | Status: DC | PRN
  Administered 2018-08-28: 200 ug via INTRAVENOUS

## 2018-08-28 MED ORDER — LIDOCAINE HCL (PF) 2 % IJ SOLN
INTRAMUSCULAR | Status: DC | PRN
  Administered 2018-08-28 (×2): 3 mL via INTRADERMAL

## 2018-08-28 MED ORDER — PROPOFOL 10 MG/ML IV BOLUS
INTRAVENOUS | Status: AC
  Filled 2018-08-28: qty 20

## 2018-08-28 MED ORDER — MIDAZOLAM HCL 2 MG/2ML IJ SOLN
INTRAMUSCULAR | Status: AC
  Filled 2018-08-28: qty 2

## 2018-08-28 MED ORDER — SUGAMMADEX SODIUM 200 MG/2ML IV SOLN
INTRAVENOUS | Status: DC | PRN
  Administered 2018-08-28: 160 mg via INTRAVENOUS

## 2018-08-28 MED ORDER — SEVOFLURANE IN SOLN
RESPIRATORY_TRACT | Status: AC
  Filled 2018-08-28: qty 250

## 2018-08-28 MED ORDER — SUGAMMADEX SODIUM 200 MG/2ML IV SOLN
INTRAVENOUS | Status: AC
  Filled 2018-08-28: qty 2

## 2018-08-28 MED ORDER — FENTANYL CITRATE (PF) 100 MCG/2ML IJ SOLN
INTRAMUSCULAR | Status: DC | PRN
  Administered 2018-08-28: 50 ug via INTRAVENOUS
  Administered 2018-08-28: 100 ug via INTRAVENOUS
  Administered 2018-08-28 (×3): 50 ug via INTRAVENOUS

## 2018-08-28 MED ORDER — MIDAZOLAM HCL 2 MG/2ML IJ SOLN
INTRAMUSCULAR | Status: DC | PRN
  Administered 2018-08-28: 2 mg via INTRAVENOUS

## 2018-08-28 MED ORDER — IPRATROPIUM-ALBUTEROL 0.5-2.5 (3) MG/3ML IN SOLN
RESPIRATORY_TRACT | Status: AC
  Filled 2018-08-28: qty 3

## 2018-08-28 MED ORDER — ROCURONIUM BROMIDE 100 MG/10ML IV SOLN
INTRAVENOUS | Status: DC | PRN
  Administered 2018-08-28: 50 mg via INTRAVENOUS
  Administered 2018-08-28: 10 mg via INTRAVENOUS

## 2018-08-28 MED ORDER — LIDOCAINE HCL 1 % IJ SOLN
INTRAMUSCULAR | Status: DC | PRN
  Administered 2018-08-28: 20 mL via INTRADERMAL

## 2018-08-28 MED ORDER — FENTANYL CITRATE (PF) 100 MCG/2ML IJ SOLN
INTRAMUSCULAR | Status: AC
  Filled 2018-08-28: qty 2

## 2018-08-28 MED ORDER — FENTANYL CITRATE (PF) 100 MCG/2ML IJ SOLN
25.0000 ug | INTRAMUSCULAR | Status: DC | PRN
  Administered 2018-08-28: 25 ug via INTRAVENOUS
  Administered 2018-08-28: 50 ug via INTRAVENOUS
  Administered 2018-08-28: 25 ug via INTRAVENOUS

## 2018-08-28 MED ORDER — SODIUM CHLORIDE 0.9 % IR SOLN
Status: DC | PRN
  Administered 2018-08-28: 100 mL

## 2018-08-28 MED ORDER — METOPROLOL TARTRATE 5 MG/5ML IV SOLN: INTRAVENOUS | Status: DC | PRN

## 2018-08-28 MED ORDER — ALBUTEROL SULFATE HFA 108 (90 BASE) MCG/ACT IN AERS
INHALATION_SPRAY | RESPIRATORY_TRACT | Status: DC | PRN
  Administered 2018-08-28 (×4): 2 via RESPIRATORY_TRACT

## 2018-08-28 MED ORDER — ROCURONIUM BROMIDE 50 MG/5ML IV SOLN
INTRAVENOUS | Status: AC
  Filled 2018-08-28: qty 1

## 2018-08-28 MED ORDER — GLYCOPYRROLATE 0.2 MG/ML IJ SOLN
INTRAMUSCULAR | Status: AC
  Filled 2018-08-28: qty 1

## 2018-08-28 MED ORDER — ONDANSETRON HCL 4 MG/2ML IJ SOLN
INTRAMUSCULAR | Status: AC
  Filled 2018-08-28: qty 2

## 2018-08-28 MED ORDER — PROPOFOL 10 MG/ML IV BOLUS
INTRAVENOUS | Status: DC | PRN
  Administered 2018-08-28: 200 mg via INTRAVENOUS

## 2018-08-28 MED ORDER — METOPROLOL TARTRATE 5 MG/5ML IV SOLN
INTRAVENOUS | Status: DC | PRN
  Administered 2018-08-28: 2 mg via INTRAVENOUS

## 2018-08-28 MED ORDER — IPRATROPIUM-ALBUTEROL 0.5-2.5 (3) MG/3ML IN SOLN
3.0000 mL | Freq: Once | RESPIRATORY_TRACT | Status: AC
  Administered 2018-08-28: 3 mL via RESPIRATORY_TRACT

## 2018-08-28 MED ORDER — BUPIVACAINE HCL (PF) 0.5 % IJ SOLN
INTRAMUSCULAR | Status: AC
  Filled 2018-08-28: qty 30

## 2018-08-28 MED ORDER — ONDANSETRON HCL 4 MG/2ML IJ SOLN
INTRAMUSCULAR | Status: DC | PRN
  Administered 2018-08-28: 4 mg via INTRAVENOUS

## 2018-08-28 MED ORDER — ONDANSETRON HCL 4 MG/2ML IJ SOLN: 4.0000 mg | Freq: Once | INTRAMUSCULAR | Status: DC | PRN

## 2018-08-28 MED ORDER — METOPROLOL TARTRATE 5 MG/5ML IV SOLN
INTRAVENOUS | Status: AC
  Filled 2018-08-28: qty 5

## 2018-08-28 MED ORDER — LIDOCAINE HCL (PF) 2 % IJ SOLN
INTRAMUSCULAR | Status: AC
  Filled 2018-08-28: qty 10

## 2018-08-28 SURGICAL SUPPLY — 38 items
APPLIER CLIP ROT 10 11.4 M/L (STAPLE) ×3
CHLORAPREP W/TINT 26ML (MISCELLANEOUS) ×3 IMPLANT
CLIP APPLIE ROT 10 11.4 M/L (STAPLE) ×1 IMPLANT
COVER WAND RF STERILE (DRAPES) ×3 IMPLANT
DECANTER SPIKE VIAL GLASS SM (MISCELLANEOUS) ×6 IMPLANT
DERMABOND ADVANCED (GAUZE/BANDAGES/DRESSINGS) ×2
DERMABOND ADVANCED .7 DNX12 (GAUZE/BANDAGES/DRESSINGS) ×1 IMPLANT
DRESSING SURGICEL FIBRLLR 1X2 (HEMOSTASIS) IMPLANT
DRSG SURGICEL FIBRILLAR 1X2 (HEMOSTASIS)
ELECT REM PT RETURN 9FT ADLT (ELECTROSURGICAL) ×3
ELECTRODE REM PT RTRN 9FT ADLT (ELECTROSURGICAL) ×1 IMPLANT
GLOVE BIO SURGEON STRL SZ7 (GLOVE) ×7 IMPLANT
GLOVE BIOGEL PI IND STRL 7.5 (GLOVE) ×1 IMPLANT
GLOVE BIOGEL PI INDICATOR 7.5 (GLOVE) ×6
GOWN STRL REUS W/ TWL LRG LVL3 (GOWN DISPOSABLE) ×3 IMPLANT
GOWN STRL REUS W/TWL LRG LVL3 (GOWN DISPOSABLE) ×6
GRASPER SUT TROCAR 14GX15 (MISCELLANEOUS) ×3 IMPLANT
IRRIGATION STRYKERFLOW (MISCELLANEOUS) IMPLANT
IRRIGATOR STRYKERFLOW (MISCELLANEOUS) ×3
IV NS 1000ML (IV SOLUTION) ×2
IV NS 1000ML BAXH (IV SOLUTION) IMPLANT
KIT TURNOVER KIT A (KITS) ×3 IMPLANT
LABEL OR SOLS (LABEL) ×3 IMPLANT
NDL INSUFFLATION 14GA 120MM (NEEDLE) ×1 IMPLANT
NEEDLE HYPO 22GX1.5 SAFETY (NEEDLE) ×3 IMPLANT
NEEDLE INSUFFLATION 14GA 120MM (NEEDLE) ×3 IMPLANT
NS IRRIG 1000ML POUR BTL (IV SOLUTION) ×3 IMPLANT
PACK LAP CHOLECYSTECTOMY (MISCELLANEOUS) ×3 IMPLANT
POUCH SPECIMEN RETRIEVAL 10MM (ENDOMECHANICALS) ×3 IMPLANT
SCISSORS METZENBAUM CVD 33 (INSTRUMENTS) ×3 IMPLANT
SET TUBE SMOKE EVAC HIGH FLOW (TUBING) ×3 IMPLANT
SLEEVE ENDOPATH XCEL 5M (ENDOMECHANICALS) ×6 IMPLANT
SUT MNCRL AB 4-0 PS2 18 (SUTURE) ×3 IMPLANT
SUT VIC AB 0 CT1 36 (SUTURE) ×2 IMPLANT
SUT VICRYL 0 UR6 27IN ABS (SUTURE) ×3 IMPLANT
SUT VICRYL AB 3-0 FS1 BRD 27IN (SUTURE) ×3 IMPLANT
TROCAR XCEL 12X100 BLDLESS (ENDOMECHANICALS) ×3 IMPLANT
TROCAR XCEL NON-BLD 5MMX100MML (ENDOMECHANICALS) ×3 IMPLANT

## 2018-08-28 NOTE — Interval H&P Note (Signed)
History and Physical Interval Note:  08/28/2018 3:02 PM  George Arellano  has presented today for surgery, with the diagnosis of cholecystitis  The various methods of treatment have been discussed with the patient and family. After consideration of risks, benefits and other options for treatment, the patient has consented to  Procedure(s): [place] RNFA IF AVAILABLE (N/A) as a surgical intervention .  The patient's history has been reviewed, patient examined, no change in status, stable for surgery.  I have reviewed the patient's chart and labs.  Questions were answered to the patient's satisfaction.     Vickie Epley

## 2018-08-28 NOTE — Anesthesia Preprocedure Evaluation (Addendum)
Anesthesia Evaluation  Patient identified by MRN, date of birth, ID band Patient awake    Reviewed: Allergy & Precautions, H&P , NPO status , Patient's Chart, lab work & pertinent test results, reviewed documented beta blocker date and time   Airway Mallampati: II  TM Distance: >3 FB Neck ROM: full    Dental  (+) Edentulous Upper, Edentulous Lower, Upper Dentures, Lower Dentures   Pulmonary neg pulmonary ROS, pneumonia, resolved, COPD,  COPD inhaler, Current Smoker,    Pulmonary exam normal        Cardiovascular Exercise Tolerance: Poor hypertension, On Medications +CHF  negative cardio ROS Normal cardiovascular exam Rhythm:regular Rate:Normal     Neuro/Psych Seizures -,  PSYCHIATRIC DISORDERS Anxiety  Neuromuscular disease CVA negative neurological ROS  negative psych ROS   GI/Hepatic negative GI ROS, Neg liver ROS,   Endo/Other  negative endocrine ROS  Renal/GU negative Renal ROS  negative genitourinary   Musculoskeletal   Abdominal   Peds  Hematology negative hematology ROS (+)   Anesthesia Other Findings Past Medical History: No date: Alcohol abuse No date: Anxiety No date: CHF (congestive heart failure) (HCC) No date: COPD (chronic obstructive pulmonary disease) (HCC) No date: Hypertension No date: Seizures ([place]) No date: Stroke [facility]) Past Surgical History: 09/15/2017: LOOP RECORDER INSERTION; N/A     Comment:  Procedure: LOOP RECORDER INSERTION;  Surgeon: Isaias Cowman, MD;  Location: [facility];  Service:              Cardiovascular;  Laterality: N/A; BMI    Body Mass Index:  25.82 kg/m     Reproductive/Obstetrics negative OB ROS                            Anesthesia Physical Anesthesia Plan  ASA: III and emergent  Anesthesia Plan: General ETT   Post-op Pain Management:    Induction:   PONV Risk Score and Plan:   Airway Management  Planned:   Additional Equipment:   Intra-op Plan:   Post-operative Plan:   Informed Consent: I have reviewed the patients History and Physical, chart, labs and discussed the procedure including the risks, benefits and alternatives for the proposed anesthesia with the patient or authorized representative who has indicated his/her understanding and acceptance.     Dental Advisory Given  Plan Discussed with: CRNA  Anesthesia Plan Comments:         Anesthesia Quick Evaluation

## 2018-08-28 NOTE — Anesthesia Post-op Follow-up Note (Signed)
Anesthesia QCDR form completed.        

## 2018-08-28 NOTE — Anesthesia Procedure Notes (Signed)
Procedure Name: Intubation Date/Time: 08/28/2018 8:16 PM Performed by: Lendon Colonel, CRNA Pre-anesthesia Checklist: Patient identified, Patient being monitored, Timeout performed, Emergency Drugs available and Suction available Patient Re-evaluated:Patient Re-evaluated prior to induction Oxygen Delivery Method: Circle system utilized Preoxygenation: Pre-oxygenation with 100% oxygen Induction Type: IV induction Ventilation: Mask ventilation without difficulty Laryngoscope Size: Mac and 3 Grade View: Grade I Tube type: Oral Tube size: 7.0 mm Number of attempts: 1 Airway Equipment and Method: Stylet Placement Confirmation: ETT inserted through vocal cords under direct vision,  positive ETCO2 and breath sounds checked- equal and bilateral Secured at: 21 cm Tube secured with: Tape Dental Injury: Teeth and Oropharynx as per pre-operative assessment

## 2018-08-28 NOTE — Progress Notes (Signed)
[facility] NAME: Fleetwood Pierron    MR#:  [id number]  DATE OF BIRTH:  [birth year]  SUBJECTIVE:.  Waiting for surgery.  No shortness of breath, denies any complaints.  CHIEF COMPLAINT:   Chief Complaint  Patient presents with  . Abdominal Pain    has right upper quadrant pain.Marland Kitchen REVIEW OF SYSTEMS:  Review of Systems  Constitutional: Negative for chills and fever.  HENT: Negative for hearing loss and tinnitus.   Eyes: Negative for blurred vision and double vision.  Respiratory: Negative for cough, hemoptysis and shortness of breath.   Cardiovascular: Negative for chest pain and palpitations.  Gastrointestinal: Positive for abdominal pain. Negative for heartburn, nausea and vomiting.  Genitourinary: Negative for dysuria and urgency.  Musculoskeletal: Negative for myalgias and neck pain.  Skin: Negative for itching and rash.  Neurological: Negative for dizziness and headaches.  Psychiatric/Behavioral: Negative for depression and hallucinations.      DRUG ALLERGIES:   Allergies  Allergen Reactions  . Atorvastatin Hives and Itching    All over trunk of body  . Levaquin [Levofloxacin] Hives and Other (See Comments)    Rash, itching all over trunk of body   VITALS:  Blood pressure 104/66, pulse 86, temperature 98.2 F (36.8 C), temperature source Oral, resp. rate 20, height 5\' 9"  (1.753 m), weight 79.3 kg, SpO2 95 %. PHYSICAL EXAMINATION:   GENERAL:  53 y.o.-year-old patient lying in the bed with no acute distress.  EYES: Pupils equal, round, reactive to light and accommodation. No scleral icterus. Extraocular muscles intact.  HEENT: Head atraumatic, normocephalic. Oropharynx and nasopharynx clear.  NECK:  Supple, no jugular venous distention. No thyroid enlargement, no tenderness.  LUNGS: Less wheezing today.  CARDIOVASCULAR: S1, S2 normal. No murmurs, rubs, or gallops.  ABDOMEN: Soft, mild tender right upper abd but no  rebound or guarding. Bowel sounds present. No organomegaly or mass.  EXTREMITIES: No pedal edema, cyanosis, or clubbing.  NEUROLOGIC: Cranial nerves II through XII are intact. Muscle strength 5/5 in all extremities. Sensation intact. Gait not checked.  PSYCHIATRIC: The patient is alert and oriented x 3.  SKIN: No obvious rash, lesion, or ulcer.    LABORATORY PANEL:  Male CBC Recent Labs  Lab 08/28/18 0442  WBC 9.5  HGB 13.2  HCT 38.2*  PLT 204   ------------------------------------------------------------------------------------------------------------------ Chemistries  Recent Labs  Lab 08/27/18 0339 08/28/18 0442  NA 133* 133*  K 4.7 4.4  CL 98 99  CO2 27 24  GLUCOSE 147* 133*  BUN 13 15  CREATININE 0.82 0.88  CALCIUM 9.0 8.9  MG 2.1  --   AST 64* 76*  ALT 55* 77*  ALKPHOS 83 82  BILITOT 1.4* 0.7   RADIOLOGY:  No results found. ASSESSMENT AND PLAN:    1. Uncontrolled HTN Blood pressure better controlled now,.  Continue lisinopril and metoprolol. PRN IV hydralazine with parameters.  Patient is n.p.o., resume BP medicines after surgery.  2. Alcohol withdrawal; completed CIWA protocol per. 3.  Acute calculus cholecystitis, patient scheduled for lap chole today.  Continue IV antibiotics today. #4 .  COPD exacerbation,im proving, continue bronchodilators, steroids, IV Rocephin, Zithromax.,  Advised to quit smoking, drinking.  Continue IV steroids, Rocephin, Zithromax today, change to orals tomorrow. DVT prophylaxis; Lovenox   All the records are reviewed and case discussed with Care Management/Social Worker. Management plans discussed with the patient, family and they are in agreement.  CODE STATUS: Full Code  TOTAL TIME  TAKING CARE OF THIS PATIENT: 35 minutes.   More than 50% of the time was spent in counseling/coordination of care: YES  POSSIBLE D/C IN 4 DAYS, DEPENDING ON CLINICAL CONDITION.   Epifanio Lesches M.D on 08/28/2018 at 1:23 PM  Between  7am to 6pm - Pager - [phone]  After 6pm go to [email or web address] - Proofreader  [facility]  Office  [phone]  CC: Primary care physician; System, Pcp Not In  Note: This dictation was prepared with Dragon dictation along with smaller phrase technology. Any transcriptional errors that result from this process are unintentional.

## 2018-08-28 NOTE — Op Note (Addendum)
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)  PROCEDURE(S): (cpt's: 23762) 1.) Laparoscopic Cholecystectomy 2.) PMI Primary Repair of Umbilical Hernia (cpt: 83151)  INTRAOPERATIVE FINDINGS: Severe pericholecystic inflammation with common bile duct tented/adherent anterior to the cystic duct and cystic artery, cystic duct and cystic artery clips ultimately well-secured, hemostasis at completion of procedure  INTRAOPERATIVE FLUIDS: 800 mL crystalloid   ESTIMATED BLOOD LOSS: Minimal (<30 mL)   URINE OUTPUT: No foley  SPECIMENS: Gallbladder  IMPLANTS: None  DRAINS: None   COMPLICATIONS: None apparent   CONDITION AT COMPLETION: Hemodynamically stable and extubated  DISPOSITION: PACU   INDICATION(S) FOR PROCEDURE:  Patient is a 53 y.o. male who this admission presented with acute onset of severe RUQ > epigastric abdominal pain. 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 Patient was also noted to have a reducible umbilical hernia in the context of his ongoing severe alcohol consumption and abuse. Upon medical risk stratification and optimization, following a brief period of alcohol detoxification, all risks, benefits, and alternatives to above elective procedures were discussed with the patient, who elected to proceed, and informed consent was accordingly obtained at that time.  DETAILS OF PROCEDURE:  Patient was brought to the operating suite and appropriately identified. General anesthesia was administered along with peri-operative prophylactic IV antibiotics, and endotracheal intubation was performed by anesthesiologist, along with NG/OG tube for gastric decompression.  In supine position, operative site was prepped and draped in usual sterile fashion, and following a brief time out, initial 5 mm incision was made in a natural skin crease just above the umbilicus. Fascia was then elevated, and a Verress needle was inserted and its proper position confirmed using aspiration and saline meniscus test.  Upon insufflation of the abdominal cavity with carbon dioxide to a well-tolerated pressure of 12-15 mmHg, 5 mm peri-umbilical port followed by laparoscope were inserted and used to inspect the abdominal cavity and its contents with no injuries from insertion of the first trochar noted. Three additional trocars were inserted, one at the epigastric position (10 mm) and two along the Right costal margin (5 mm). The table was then placed in reverse Trendelenburg position with the Right side up. Extensive dense fibrous adhesions between the gallbladder and omentum/duodenum/transverse colon were lysed using combined blunt dissection and selective electrocautery. After needle-suction decompression, the apex/dome of the gallbladder was grasped with an atraumatic grasper passed through the lateral port and retracted apically over the liver. The infundibulum was also grasped and retracted, though the gallbladder wall was extremely friable and Calot's triangle was initially quite obscured by extensive overlying dense fibrotic inflammatory tissues. The peritoneum overlying the gallbladder infundibulum was incised and dissected free of surrounding peritoneal attachments, revealing what appeared to be the cystic duct and cystic artery. However, visualization was challenging, and decision was made to approach cholecystectomy from a top-/dome-down approach, by which the gallbladder was dissected from its peritoneal attachments to the liver using blunt dissection and electrocautery, after which the gallbladder remained attached only by the cystic duct and cystic artery, which were further dissected  free from surrounding inflammatory tissues and ultimately clipped twice on the patient side and once on the gallbladder specimen side close to the gallbladder. The gallbladder was then placed into a laparoscopic specimen bag and removed from the abdominal cavity via the epigastric  port site. Hemostasis and secure placement of clips were confirmed, and intra-peritoneal cavity was inspected with no additional findings. PMI laparoscopic fascial closure device was then used to re-approximate fascia at the 10 mm epigastric port site as well as to primarily repair patient's ~1 cm umbilical hernia 1.5 cm inferior to umbilical port site in the context of ongoing alcohol abuse with existing chronic alcoholic liver disease, not yet with significant ascites appreciated.  All ports were then removed under direct visualization, and abdominal cavity was desuflated. All port sites were irrigated/cleaned, additional local anesthetic was injected at each incision, 3-0 Vicryl was used to re-approximate dermis at 10 mm port site(s), and subcuticular 4-0 Monocryl suture was used to re-approximate skin. Skin was then cleaned, dried, and sterile skin glue was applied. Patient was then safely able to be awakened, extubated, and transferred to PACU for post-operative monitoring and care.   I was present for all aspects of the above procedure, and no operative complications were apparent.

## 2018-08-28 NOTE — Transfer of Care (Signed)
Immediate Anesthesia Transfer of Care Note  Patient: George Arellano  Procedure(s) Performed: LAPAROSCOPIC CHOLECYSTECTOMY umbilical hernia repair (N/A Abdomen)  Patient Location: PACU  Anesthesia Type:General  Level of Consciousness: awake, alert  and patient cooperative  Airway & Oxygen Therapy: Patient Spontanous Breathing and Patient connected to nasal cannula oxygen  Post-op Assessment: Report given to RN and Post -op Vital signs reviewed and stable  Post vital signs: Reviewed and stable  Last Vitals:  Vitals Value Taken Time  BP 117/64 08/28/2018 10:43 PM  Temp    Pulse 92 08/28/2018 10:47 PM  Resp 15 08/28/2018 10:47 PM  SpO2 96 % 08/28/2018 10:47 PM  Vitals shown include unvalidated device data.  Last Pain:  Vitals:   08/28/18 1337  TempSrc: Oral  PainSc: 0-No pain      Patients Stated Pain Goal: 1 (21/30/86 5784)  Complications: No apparent anesthesia complications

## 2018-08-29 ENCOUNTER — Encounter: Payer: Self-pay | Admitting: Surgery

## 2018-08-29 LAB — COMPREHENSIVE METABOLIC PANEL
ALT: 109 U/L — ABNORMAL HIGH (ref 0–44)
AST: 109 U/L — ABNORMAL HIGH (ref 15–41)
Albumin: 3.3 g/dL — ABNORMAL LOW (ref 3.5–5.0)
Alkaline Phosphatase: 77 U/L (ref 38–126)
Anion gap: 6 (ref 5–15)
BUN: 18 mg/dL (ref 6–20)
CO2: 26 mmol/L (ref 22–32)
Calcium: 8.4 mg/dL — ABNORMAL LOW (ref 8.9–10.3)
Chloride: 103 mmol/L (ref 98–111)
Creatinine, Ser: 0.96 mg/dL (ref 0.61–1.24)
GFR calc Af Amer: >60 mL/min (ref >60)
GFR calc non Af Amer: >60 mL/min (ref >60)
Glucose, Bld: 121 mg/dL — ABNORMAL HIGH (ref 70–99)
Potassium: 3.8 mmol/L (ref 3.5–5.1)
Sodium: 135 mmol/L (ref 135–145)
Total Bilirubin: 1.3 mg/dL — ABNORMAL HIGH (ref 0.3–1.2)
Total Protein: 6.1 g/dL — ABNORMAL LOW (ref 6.5–8.1)

## 2018-08-29 LAB — CBC
HEMATOCRIT: 40.5 % (ref 39.0–52.0)
Hemoglobin: 13.5 g/dL (ref 13.0–17.0)
MCH: 34.6 pg — ABNORMAL HIGH (ref 26.0–34.0)
MCHC: 33.3 g/dL (ref 30.0–36.0)
MCV: 103.8 fL — ABNORMAL HIGH (ref 80.0–100.0)
Platelets: 245 10*3/uL (ref 150–400)
RBC: 3.9 MIL/uL — ABNORMAL LOW (ref 4.22–5.81)
RDW: 11.7 % (ref 11.5–15.5)
WBC: 12.1 10*3/uL — AB (ref 4.0–10.5)
nRBC: 0 % (ref 0.0–0.2)

## 2018-08-29 MED ORDER — PANTOPRAZOLE SODIUM 40 MG IV SOLR
40.0000 mg | INTRAVENOUS | Status: DC
  Administered 2018-08-29 – 2018-08-30 (×2): 40 mg via INTRAVENOUS
  Filled 2018-08-29 (×2): qty 40

## 2018-08-29 MED ORDER — OXYCODONE HCL 5 MG PO TABS
5.0000 mg | ORAL_TABLET | Freq: Four times a day (QID) | ORAL | Status: DC | PRN
  Administered 2018-08-29 – 2018-08-31 (×7): 5 mg via ORAL
  Filled 2018-08-29 (×7): qty 1

## 2018-08-29 MED ORDER — LACTATED RINGERS IV SOLN
INTRAVENOUS | Status: DC
  Administered 2018-08-29 – 2018-08-30 (×3): via INTRAVENOUS

## 2018-08-29 MED ORDER — ENOXAPARIN SODIUM 40 MG/0.4ML ~~LOC~~ SOLN
40.0000 mg | SUBCUTANEOUS | Status: DC
  Administered 2018-08-30 – 2018-08-31 (×2): 40 mg via SUBCUTANEOUS
  Filled 2018-08-29 (×2): qty 0.4

## 2018-08-29 MED ORDER — MENTHOL 3 MG MT LOZG
1.0000 | LOZENGE | OROMUCOSAL | Status: DC | PRN
  Administered 2018-08-29 – 2018-08-30 (×3): 3 mg via ORAL
  Filled 2018-08-29: qty 9

## 2018-08-29 MED ORDER — METHYLPREDNISOLONE SODIUM SUCC 40 MG IJ SOLR
40.0000 mg | INTRAMUSCULAR | Status: DC
  Administered 2018-08-29 – 2018-08-30 (×2): 40 mg via INTRAVENOUS
  Filled 2018-08-29 (×2): qty 1

## 2018-08-29 NOTE — Progress Notes (Signed)
[facility] Hospital Day(s): 4.   Post op day(s): 1 Day Post-Op.   Interval History: Patient seen and examined, no acute events or new complaints overnight. Patient reports he has abdominal soreness and nausea this morning. Soreness worse near epigastric port site. No complaints of fever, chills. Has been able to tolerate sips of clears. Mobilizing well.   Review of Systems:  Constitutional: denies fever, chills  Gastrointestinal: + abdominal pain (incisional soreness), + Nausea, denied Vomiting, or diarrhea/and bowel function as per interval history Integumentary: denies any other rashes or skin discolorations except laparoscopic incisions  Vital signs in last 24 hours: [min-max] current  Temp:  [97.2 F (36.2 C)-98.9 F (37.2 C)] 98.4 F (36.9 C) (02/18 0406) Pulse Rate:  [90-118] 104 (02/18 0406) Resp:  [11-24] 20 (02/18 0406) BP: (103-139)/(63-87) 139/84 (02/18 0406) SpO2:  [96 %-100 %] 98 % (02/18 0406)     Height: 5\' 9"  (175.3 cm) Weight: 79.3 kg BMI (Calculated): 25.81   Intake/Output this shift:  Total I/O In: -  Out: 375 [Urine:375]   Intake/Output last 2 shifts:  @IOLAST2SHIFTS @   Physical Exam:  Constitutional: alert, cooperative and no distress  Respiratory: breathing non-labored at rest  Gastrointestinal: soft, incisional tenderness, and non-distended Integumentary: laparoscopic incisions are CDI, no erythema or drainage  Labs:  CBC Latest Ref Rng & Units 08/29/2018 08/28/2018 08/27/2018  WBC 4.0 - 10.5 K/uL 12.1(H) 9.5 10.1  Hemoglobin 13.0 - 17.0 g/dL 13.5 13.2 13.9  Hematocrit 39.0 - 52.0 % 40.5 38.2(L) 41.4  Platelets 150 - 400 K/uL 245 204 179   CMP Latest Ref Rng & Units 08/29/2018 08/28/2018 08/27/2018  Glucose 70 - 99 mg/dL 121(H) 133(H) 147(H)  BUN 6 - 20 mg/dL 18 15 13   Creatinine 0.61 - 1.24 mg/dL 0.96 0.88 0.82  Sodium 135 - 145 mmol/L 135 133(L) 133(L)  Potassium 3.5 - 5.1 mmol/L 3.8 4.4 4.7  Chloride 98 -  111 mmol/L 103 99 98  CO2 22 - 32 mmol/L 26 24 27   Calcium 8.9 - 10.3 mg/dL 8.4(L) 8.9 9.0  Total Protein 6.5 - 8.1 g/dL 6.1(L) 6.5 6.9  Total Bilirubin 0.3 - 1.2 mg/dL 1.3(H) 0.7 1.4(H)  Alkaline Phos 38 - 126 U/L 77 82 83  AST 15 - 41 U/L 109(H) 76(H) 64(H)  ALT 0 - 44 U/L 109(H) 77(H) 55(H)      Imaging studies: No new pertinent imaging studies   Assessment/Plan: (ICD-10's: K80.20 +/- K70.31 vs K80.01) 53 y.o. male with mild leukocytosis and elevated LFTS likely attributable to recent surgery 1 Day Post-Op s/p laparoscopic cholecystectomy for acute cholecystitis, complicated by pertinent comorbidities including HTN, COPD (onqhshome supplemental oxygen), CHF, Left hemiparesis s/p stroke, chronicatrial fibrillation on full dose ASA without therapeutic anticoagulation, seizuredisorder,generalized anxiety disorder, and chronic ongoingtobaccoand marijuanaabuse (smoking)..   - Continue clear liquids, advance diet as toelrates   - pain control prn, antiemetics prn  - continue to monitor abdominal examination  - Monitor leukocytosis/elevated LFT tomorrow AM  - Continue CIWA  - Medical management of comorbidities (appreciate medicine involvement)  - Hopefully home tomorrow    All of the above findings and recommendations were discussed with the patient, patient's family, and the medical team, and all of patient's and family's questions were answered to their expressed satisfaction.  -- Edison Simon, PA-C [facility] 08/29/2018, 8:58 AM [phone] M-F: 7am - 4pm

## 2018-08-29 NOTE — Progress Notes (Signed)
[facility] [place] NAME: George Arellano    MR#:  [id number]  DATE OF BIRTH:  10-13-1965  Lap chole yesterday but he appears more uncomfortable with a lot of abdominal pain nausea today and also shaking more today.  50s are slightly worse today.  CHIEF COMPLAINT:   Chief Complaint  Patient presents with  . Abdominal Pain    has right upper quadrant pain.Marland Kitchen REVIEW OF SYSTEMS:  Review of Systems  Constitutional: Negative for chills and fever.  HENT: Negative for hearing loss and tinnitus.   Eyes: Negative for blurred vision, double vision and photophobia.  Respiratory: Negative for cough, hemoptysis and shortness of breath.   Cardiovascular: Negative for chest pain, palpitations, orthopnea and leg swelling.  Gastrointestinal: Positive for abdominal pain and nausea. Negative for diarrhea, heartburn and vomiting.  Genitourinary: Negative for dysuria and urgency.  Musculoskeletal: Negative for myalgias and neck pain.  Skin: Negative for itching and rash.  Neurological: Negative for dizziness, focal weakness, seizures, weakness and headaches.  Psychiatric/Behavioral: Negative for depression, hallucinations and memory loss. The patient does not have insomnia.       DRUG ALLERGIES:   Allergies  Allergen Reactions  . Atorvastatin Hives and Itching    All over trunk of body  . Levaquin [Levofloxacin] Hives and Other (See Comments)    Rash, itching all over trunk of body   VITALS:  Blood pressure 122/85, pulse (!) 108, temperature 98.2 F (36.8 C), temperature source Oral, resp. rate 20, height 5\' 9"  (1.753 m), weight 79.3 kg, SpO2 98 %. PHYSICAL EXAMINATION:   GENERAL:  53 y.o.-year-old patient lying in the bed with no acute distress.  EYES: Pupils equal, round, reactive to light and accommodation. No scleral icterus. Extraocular muscles intact.  HEENT: Head atraumatic, normocephalic. Oropharynx and nasopharynx clear.  NECK:  Supple, no  jugular venous distention. No thyroid enlargement, no tenderness.  LUNGS: Less wheezing today.  CARDIOVASCULAR: S1, S2 normal. No murmurs, rubs, or gallops.  ABDOMEN: t abdominal discomfort status post surgery. EXTREMITIES: No pedal edema, cyanosis, or clubbing.  NEUROLOGIC: Cranial nerves II through XII are intact. Muscle strength 5/5 in all extremities. Sensation intact. Gait not checked.  PSYCHIATRIC: The patient is alert and oriented x 3.  SKIN: No obvious rash, lesion, or ulcer.    LABORATORY PANEL:  Male CBC Recent Labs  Lab 08/29/18 0502  WBC 12.1*  HGB 13.5  HCT 40.5  PLT 245   ------------------------------------------------------------------------------------------------------------------ Chemistries  Recent Labs  Lab 08/27/18 0339  08/29/18 0502  NA 133*   < > 135  K 4.7   < > 3.8  CL 98   < > 103  CO2 27   < > 26  GLUCOSE 147*   < > 121*  BUN 13   < > 18  CREATININE 0.82   < > 0.96  CALCIUM 9.0   < > 8.4*  MG 2.1  --   --   AST 64*   < > 109*  ALT 55*   < > 109*  ALKPHOS 83   < > 77  BILITOT 1.4*   < > 1.3*   < > = values in this interval not displayed.   RADIOLOGY:  No results found. ASSESSMENT AND PLAN:    1. Uncontrolled HTN['better controlled.  2. Alcohol withdrawal; patient was not having any tremors yesterday but he is more shaky today, use Ativan for CIWA protocol.  3.  Acute calculus cholecystitis, status  post lap chole, on clear liquids, seen by surgery, continue IV pain meds, nausea meds, conservative treatment and see how he does.  #4 .  COPD exacerbation,im proving, continue bronchodilators, steroids, IV Rocephin, Zithromax.,  Advised to quit smoking, drinking.  Dose steroids, continue IV antibiotics for today as patient is having nausea, dry heaving. #4.elevated LFTs, LFTs are slightly worse today.  Follow the trend.  #5. slightly elevated white count secondary to steroids also. DVT prophylaxis; Lovenox   All the records are reviewed  and case discussed with Care Management/Social Worker. Management plans discussed with the patient, family and they are in agreement.  CODE STATUS: Full Code  TOTAL TIME TAKING CARE OF THIS PATIENT: 35 minutes.   More than 50% of the time was spent in counseling/coordination of care: YES  POSSIBLE D/C IN 4 DAYS, DEPENDING ON CLINICAL CONDITION.   Epifanio Lesches M.D on 08/29/2018 at 12:53 PM  Between 7am to 6pm - Pager - [phone]  After 6pm go to [email or web address] - Proofreader  [facility]  Office  [phone]  CC: Primary care physician; System, Pcp Not In  Note: This dictation was prepared with Dragon dictation along with smaller phrase technology. Any transcriptional errors that result from this process are unintentional.

## 2018-08-30 LAB — COMPREHENSIVE METABOLIC PANEL
ALK PHOS: 67 U/L (ref 38–126)
ALT: 72 U/L — ABNORMAL HIGH (ref 0–44)
AST: 52 U/L — ABNORMAL HIGH (ref 15–41)
Albumin: 3 g/dL — ABNORMAL LOW (ref 3.5–5.0)
Anion gap: 6 (ref 5–15)
BUN: 11 mg/dL (ref 6–20)
CALCIUM: 8.6 mg/dL — AB (ref 8.9–10.3)
CO2: 30 mmol/L (ref 22–32)
Chloride: 98 mmol/L (ref 98–111)
Creatinine, Ser: 0.66 mg/dL (ref 0.61–1.24)
GFR calc Af Amer: >60 mL/min (ref >60)
GFR calc non Af Amer: >60 mL/min (ref >60)
Glucose, Bld: 107 mg/dL — ABNORMAL HIGH (ref 70–99)
Potassium: 4.5 mmol/L (ref 3.5–5.1)
Sodium: 134 mmol/L — ABNORMAL LOW (ref 135–145)
Total Bilirubin: 1.2 mg/dL (ref 0.3–1.2)
Total Protein: 6 g/dL — ABNORMAL LOW (ref 6.5–8.1)

## 2018-08-30 LAB — CBC
HCT: 37 % — ABNORMAL LOW (ref 39.0–52.0)
Hemoglobin: 12.4 g/dL — ABNORMAL LOW (ref 13.0–17.0)
MCH: 34 pg (ref 26.0–34.0)
MCHC: 33.5 g/dL (ref 30.0–36.0)
MCV: 101.4 fL — ABNORMAL HIGH (ref 80.0–100.0)
NRBC: 0 % (ref 0.0–0.2)
Platelets: 232 10*3/uL (ref 150–400)
RBC: 3.65 MIL/uL — ABNORMAL LOW (ref 4.22–5.81)
RDW: 11.2 % — ABNORMAL LOW (ref 11.5–15.5)
WBC: 10.5 10*3/uL (ref 4.0–10.5)

## 2018-08-30 LAB — SURGICAL PATHOLOGY

## 2018-08-30 MED ORDER — OXYCODONE HCL 5 MG PO TABS: 5.0000 mg | ORAL_TABLET | Freq: Four times a day (QID) | ORAL | 0 refills | Status: DC | PRN

## 2018-08-30 MED ORDER — SODIUM CHLORIDE 0.9 % IV SOLN
INTRAVENOUS | Status: DC | PRN
  Administered 2018-08-30 – 2018-08-31 (×2): 250 mL via INTRAVENOUS

## 2018-08-30 NOTE — Discharge Summary (Addendum)
[facility] SURGICAL DISCHARGE SUMMARY  Patient ID: Omarius Grantham MRN: [id number] DOB/AGE: [birth year] 53 y.o.  Admit date: 08/23/2018 Discharge date: 08/31/18   Discharge Diagnoses Acute Cholecystitis  Consultants None  Procedures 08/28/2018:  Laparoscopic Cholecystectomy  HPI: 53 y.[facility] for evaluation of abdominal pain. Patient reportsthat around 4 AM he woke up as he typically does and made taquitos and coffee for breakfast. After eating this, he noticed the acute onset of epigastric and RUQ sharp abdominal pain. He endorsed associated nausea and multiple episodes of non-bloody, non-bilious emesis.He says the pain did not improve after multiple doses of morphine, but has nearly resolved following recommended Toradol and fentanyl. He also describes multiple prior less severe episodes (including one 2 weeks ago after eating pork chops) and a few episodes for which he presented to ED and was discharged home. He deniesfevers/chills,change in BM's, orCP,describesSOBunchanged frombaseline, which requires him to lean on mailbox to catch his breath after walking from his house to pick up his mail. He says he otherwise sits at home and drinks alcohol, smokes tobacco and marijuana. Hespecifies he no longer drinks 3 - 4 twelve-packs of beer per day and "only" now drinks"two 40 oz and one24 oz beers"perday. He is unable to say whether he could walk up a flight of steps without CP or SOB as he avoids doing so due to Left-sided weakness s/p stroke.  Hospital Course: He was admitted to general surgery service and underwent HIDA on HD1 (02/13) which was concerning for acute cholecystitis. He remained in the hospital under CIWA protocol until 02/17. Informed consent was obtained and documented, and patient underwent uneventful laparoscopic cholecystectomy (Dr Rosana Hoes, 08/28/2018).  Post-operatively, patient's pain improved/resolved and advancement of  patient's diet and ambulation were well-tolerated. The remainder of patient's hospital course was essentially unremarkable, and discharge planning was initiated accordingly with patient safely able to be discharged home with appropriate discharge instructions, pain control, and outpatient follow-up after all of his and his family's questions were answered to their expressed satisfaction.  Discharge Condition: Good   Physical Examination:  Constitutional: Well appearing male, NAD Pulmonary: Normal effort, no respiratory distress Gastrointestinal: Soft, incisional tenderness, non-distended Skin: Laparoscopic incisions are CDI, no erythema or drainage   Allergies as of 08/31/2018      Reactions   Atorvastatin Hives, Itching   All over trunk of body   Levaquin [levofloxacin] Hives, Other (See Comments)   Rash, itching all over trunk of body      Medication List    TAKE these medications   albuterol 108 (90 Base) MCG/ACT inhaler Commonly known as:  PROVENTIL HFA;VENTOLIN HFA Inhale 2 puffs into the lungs every 6 (six) hours as needed for wheezing or shortness of breath.   aspirin 325 MG tablet Take 1 tablet (325 mg total) by mouth daily.   budesonide-formoterol 160-4.5 MCG/ACT inhaler Commonly known as:  SYMBICORT Inhale 2 puffs into the lungs 2 (two) times daily.   cholecalciferol 1000 units tablet Commonly known as:  VITAMIN D Take 2,000 Units by mouth daily.   COMBIVENT RESPIMAT 20-100 MCG/ACT Aers respimat Generic drug:  Ipratropium-Albuterol Inhale 2 puffs into the lungs every 6 (six) hours as needed for wheezing or shortness of breath.   ipratropium-albuterol 0.5-2.5 (3) MG/3ML Soln Commonly known as:  DUONEB Take 3 mLs by nebulization every 6 (six) hours as needed (for shortness of breath/wheezing).   docusate sodium 250 MG capsule Commonly known as:  COLACE Take 1 capsule (250 mg total) by  mouth daily.   gabapentin 300 MG capsule Commonly known as:   NEURONTIN Take 300 mg by mouth 2 (two) times daily.   lamoTRIgine 100 MG tablet Commonly known as:  LAMICTAL Take 1 tablet (100 mg total) by mouth 2 (two) times daily for 30 days.   lisinopril 2.5 MG tablet Commonly known as:  PRINIVIL,ZESTRIL Take 2.5 mg by mouth daily.   metoprolol tartrate 50 MG tablet Commonly known as:  LOPRESSOR Take 50 mg by mouth 2 (two) times daily.   multivitamin with minerals tablet Take 1 tablet by mouth daily.   oxyCODONE 5 MG immediate release tablet Commonly known as:  Oxy IR/ROXICODONE Take 1 tablet (5 mg total) by mouth every 6 (six) hours as needed for severe pain.   vitamin B-12 1000 MCG tablet Commonly known as:  CYANOCOBALAMIN Take 1,000 mcg by mouth daily.        Follow-up Information    Vickie Epley, MD. Schedule an appointment as soon as possible for a visit on 09/14/2018.   Specialty:  General Surgery Why:  @9am  for follow up for s/p lap chole Contact information: [address] [place] [address] [phone]            -- Tylan Briguglio, PA-C [facility] 08/31/2018, 1:59 PM [phone] M-F: 7am - 4pm

## 2018-08-30 NOTE — Progress Notes (Signed)
[facility] Day(s): 5.   Post op day(s): 2 Days Post-Op.   Interval History: Patient seen and examined, no acute events or new complaints overnight. Patient reports improvement in his abdominal pain without any fevers, emesis, chills, or nausea. Has tolerated clear liquids and soft diet today. However, he reports that he has not passed any gas since the procedure. No reported BM.   Review of Systems:  Constitutional: denies fever, chills  Respiratory: denies any shortness of breath  Gastrointestinal: denies abdominal pain, N/V, or diarrhea/and bowel function as per interval history Integumentary: denies any other rashes or skin discolorations except surgical incisions  Vital signs in last 24 hours: [min-max] current  Temp:  [97.8 F (36.6 C)-98.8 F (37.1 C)] 98 F (36.7 C) (02/19 1154) Pulse Rate:  [81-100] 100 (02/19 1224) Resp:  [19-20] 19 (02/19 1154) BP: (98-150)/(74-98) 98/74 (02/19 1154) SpO2:  [89 %-100 %] 89 % (02/19 1224)     Height: 5\' 9"  (175.3 cm) Weight: 79.3 kg BMI (Calculated): 25.81   Intake/Output this shift:  Total I/O In: -  Out: 300 [Urine:300]    Physical Exam:  Constitutional: alert, cooperative and no distress  Respiratory: breathing non-labored at rest  Cardiovascular: regular rate and sinus rhythm  Gastrointestinal: soft, non-tender, and mildly distended, no rebound or guarding Integumentary: Laparoscopic incisions are CDI, no erythema or drainage   Labs:  CBC Latest Ref Rng & Units 08/30/2018 08/29/2018 08/28/2018  WBC 4.0 - 10.5 K/uL 10.5 12.1(H) 9.5  Hemoglobin 13.0 - 17.0 g/dL 12.4(L) 13.5 13.2  Hematocrit 39.0 - 52.0 % 37.0(L) 40.5 38.2(L)  Platelets 150 - 400 K/uL 232 245 204   CMP Latest Ref Rng & Units 08/30/2018 08/29/2018 08/28/2018  Glucose 70 - 99 mg/dL 107(H) 121(H) 133(H)  BUN 6 - 20 mg/dL 11 18 15   Creatinine 0.61 - 1.24 mg/dL 0.66 0.96 0.88  Sodium 135 - 145 mmol/L 134(L) 135 133(L)   Potassium 3.5 - 5.1 mmol/L 4.5 3.8 4.4  Chloride 98 - 111 mmol/L 98 103 99  CO2 22 - 32 mmol/L 30 26 24   Calcium 8.9 - 10.3 mg/dL 8.6(L) 8.4(L) 8.9  Total Protein 6.5 - 8.1 g/dL 6.0(L) 6.1(L) 6.5  Total Bilirubin 0.3 - 1.2 mg/dL 1.2 1.3(H) 0.7  Alkaline Phos 38 - 126 U/L 67 77 82  AST 15 - 41 U/L 52(H) 109(H) 76(H)  ALT 0 - 44 U/L 72(H) 109(H) 77(H)     Imaging studies: No new pertinent imaging studies   Assessment/Plan: (ICD-10's: K80.20 +/- K70.31 vs K80.01) 53 y.o. male overall doing well with improved leukocytosis and hepatic function however still without evidence of bowel function concerning for potential ileus 2 Days Post-Op s/p laparoscopic cholecystectomy for acute cholecystitis, complicated by pertinent comorbidities including HTN, COPD (onqhshome supplemental oxygen), CHF, Left hemiparesis s/p stroke, chronicatrial fibrillation on full dose ASA without therapeutic anticoagulation, seizuredisorder,generalized anxiety disorder, and chronic ongoingtobaccoand marijuanaabuse (smoking).   - Advance to soft diet, discontinue IVF  - pain control prn, antiemetics prn             - continue to monitor abdominal examination, on-going bowel function   - Monitor LFTs tomorrow AM             - Continue CIWA             - Medical management of comorbidities (appreciate medicine involvement)   - Discharge Planning: Given lack of flatus and significant gallbladder disease/swelling, will hold discharge until  evidence of bowel function returns.    All of the above findings and recommendations were discussed with the patient, patient's family, and the medical team, and all of patient's and family's questions were answered to their expressed satisfaction.  -- Edison Simon, PA-C [facility] 08/30/2018, 1:28 PM [phone] M-F: 7am - 4pm

## 2018-08-30 NOTE — Discharge Instructions (Signed)
In addition to included general post-operative instructions for laparoscopic cholecystectomy,  Diet: Resume home heart healthy  diet.   Activity: No heavy lifting >20 pounds (children, pets, laundry, garbage) or strenuous activity until follow-up in 2 weeks, but light activity and walking are encouraged. Do not drive or drink alcohol if taking narcotic pain medications.  Wound care: You may shower/get incision wet with soapy water and pat dry (do not rub incisions), but no baths or submerging incision underwater until follow-up.   Medications: Resume all home medications. For mild to moderate pain: acetaminophen (Tylenol) or ibuprofen/naproxen (if no kidney disease). Combining Tylenol (you should not take Tylenol) with alcohol can substantially increase your risk of causing liver disease. Narcotic pain medications, if prescribed, can be used for severe pain, though may cause nausea, constipation, and drowsiness. Do not combine Tylenol and Percocet (or similar) within a 6 hour period as Percocet (and similar) contain(s) Tylenol. If you do not need the narcotic pain medication, you do not need to fill the prescription.  Call office [phone] / [phone]) at any time if any questions, worsening pain, fevers/chills, bleeding, drainage from incision site, or other concerns.

## 2018-08-30 NOTE — Anesthesia Postprocedure Evaluation (Signed)
Anesthesia Post Note  Patient: George Arellano  Procedure(s) Performed: LAPAROSCOPIC CHOLECYSTECTOMY umbilical hernia repair (N/A Abdomen)  Patient location during evaluation: PACU Anesthesia Type: General Level of consciousness: awake and alert Pain management: pain level controlled Vital Signs Assessment: post-procedure vital signs reviewed and stable Respiratory status: spontaneous breathing, nonlabored ventilation, respiratory function stable and patient connected to nasal cannula oxygen Cardiovascular status: blood pressure returned to baseline and stable Postop Assessment: no apparent nausea or vomiting Anesthetic complications: no     Last Vitals:  Vitals:   08/29/18 2045 08/30/18 0449  BP: (!) 145/98 (!) 150/84  Pulse: 99 81  Resp: 20 20  Temp: 37.1 C 36.6 C  SpO2: 98% 100%    Last Pain:  Vitals:   08/30/18 0850  TempSrc:   PainSc: 7                  Molli Barrows

## 2018-08-31 LAB — COMPREHENSIVE METABOLIC PANEL
ALT: 65 U/L — ABNORMAL HIGH (ref 0–44)
AST: 47 U/L — ABNORMAL HIGH (ref 15–41)
Albumin: 2.9 g/dL — ABNORMAL LOW (ref 3.5–5.0)
Alkaline Phosphatase: 67 U/L (ref 38–126)
Anion gap: 7 (ref 5–15)
BUN: 10 mg/dL (ref 6–20)
CHLORIDE: 96 mmol/L — AB (ref 98–111)
CO2: 28 mmol/L (ref 22–32)
Calcium: 8.6 mg/dL — ABNORMAL LOW (ref 8.9–10.3)
Creatinine, Ser: 0.61 mg/dL (ref 0.61–1.24)
GFR calc Af Amer: >60 mL/min (ref >60)
GFR calc non Af Amer: >60 mL/min (ref >60)
Glucose, Bld: 93 mg/dL (ref 70–99)
Potassium: 4 mmol/L (ref 3.5–5.1)
SODIUM: 131 mmol/L — AB (ref 135–145)
Total Bilirubin: 0.7 mg/dL (ref 0.3–1.2)
Total Protein: 6.2 g/dL — ABNORMAL LOW (ref 6.5–8.1)

## 2018-08-31 MED ORDER — PANTOPRAZOLE SODIUM 40 MG PO TBEC
40.0000 mg | DELAYED_RELEASE_TABLET | Freq: Every day | ORAL | Status: DC
  Filled 2018-08-31: qty 1

## 2018-08-31 MED ORDER — DOCUSATE SODIUM 250 MG PO CAPS: 250.0000 mg | ORAL_CAPSULE | Freq: Every day | ORAL | 0 refills | Status: DC

## 2018-08-31 MED ORDER — DOCUSATE SODIUM 100 MG PO CAPS
100.0000 mg | ORAL_CAPSULE | Freq: Every day | ORAL | Status: DC
  Administered 2018-08-31: 100 mg via ORAL
  Filled 2018-08-31: qty 1

## 2018-08-31 MED ORDER — POLYETHYLENE GLYCOL 3350 17 G PO PACK
17.0000 g | PACK | Freq: Once | ORAL | Status: AC
  Administered 2018-08-31: 17 g via ORAL
  Filled 2018-08-31: qty 1

## 2018-08-31 NOTE — Progress Notes (Signed)
[facility] [place] NAME: George Arellano    MR#:  [id number]  DATE OF BIRTH:  1966/02/26 Lap  Better than yesteray but still not passing gas.  CHIEF COMPLAINT:   Chief Complaint  Patient presents with  . Abdominal Pain    has right upper quadrant pain.Marland Kitchen REVIEW OF SYSTEMS:  Review of Systems  Constitutional: Negative for chills and fever.  HENT: Negative for hearing loss and tinnitus.   Eyes: Negative for blurred vision, double vision and photophobia.  Respiratory: Negative for cough, hemoptysis and shortness of breath.   Cardiovascular: Negative for chest pain, palpitations, orthopnea and leg swelling.  Gastrointestinal: Negative for abdominal pain, diarrhea, heartburn, nausea and vomiting.  Genitourinary: Negative for dysuria and urgency.  Musculoskeletal: Negative for myalgias and neck pain.  Skin: Negative for itching and rash.  Neurological: Negative for dizziness, focal weakness, seizures, weakness and headaches.  Endo/Heme/Allergies: Negative for environmental allergies.  Psychiatric/Behavioral: Negative for depression, hallucinations and memory loss. The patient does not have insomnia.       DRUG ALLERGIES:   Allergies  Allergen Reactions  . Atorvastatin Hives and Itching    All over trunk of body  . Levaquin [Levofloxacin] Hives and Other (See Comments)    Rash, itching all over trunk of body   VITALS:  Blood pressure 118/71, pulse 79, temperature 98.2 F (36.8 C), temperature source Oral, resp. rate 20, height 5\' 9"  (1.753 m), weight 79.3 kg, SpO2 99 %. PHYSICAL EXAMINATION:   GENERAL:  53 y.o.-year-old patient lying in the bed with no acute distress.  EYES: Pupils equal, round, reactive to light and accommodation. No scleral icterus. Extraocular muscles intact.  HEENT: Head atraumatic, normocephalic. Oropharynx and nasopharynx clear.  NECK:  Supple, no jugular venous distention. No thyroid enlargement, no  tenderness.  LUNGS: Less wheezing today.  CARDIOVASCULAR: S1, S2 normal. No murmurs, rubs, or gallops.  ABDOMEN: t abdominal discomfort status post surgery. EXTREMITIES: No pedal edema, cyanosis, or clubbing.  NEUROLOGIC: Cranial nerves II through XII are intact. Muscle strength 5/5 in all extremities. Sensation intact. Gait not checked.  PSYCHIATRIC: The patient is alert and oriented x 3.  SKIN: No obvious rash, lesion, or ulcer.    LABORATORY PANEL:  Male CBC Recent Labs  Lab 08/30/18 0450  WBC 10.5  HGB 12.4*  HCT 37.0*  PLT 232   ------------------------------------------------------------------------------------------------------------------ Chemistries  Recent Labs  Lab 08/27/18 0339  08/30/18 0450  NA 133*   < > 134*  K 4.7   < > 4.5  CL 98   < > 98  CO2 27   < > 30  GLUCOSE 147*   < > 107*  BUN 13   < > 11  CREATININE 0.82   < > 0.66  CALCIUM 9.0   < > 8.6*  MG 2.1  --   --   AST 64*   < > 52*  ALT 55*   < > 72*  ALKPHOS 83   < > 67  BILITOT 1.4*   < > 1.2   < > = values in this interval not displayed.   RADIOLOGY:  No results found. ASSESSMENT AND PLAN:    1. Uncontrolled HTN['better controlled.  2. Alcohol withdrawal;finished withdrawing.no need of ativan at discharge.  3.  Acute calculus cholecystitis, status post lap chole, on clear liquids, seen by surgery, continue IV pain meds, nausea meds, conservative treatment and see how he does.  #4 .  COPD exacerbation;improved.no  further need of abx at discharge.   #5. slightly elevated white count secondary to steroids also. DVT prophylaxis; Lovenox PT can resume his home dose inhalers at discharge,  All the records are reviewed and case discussed with Care Management/Social Worker. Management plans discussed with the patient, family and they are in agreement.  CODE STATUS: Full Code  TOTAL TIME TAKING CARE OF THIS PATIENT: 18 min   Epifanio Lesches M.D on  at 7:50 AM  Between 7am to 6pm -  Pager - [phone]  After 6pm go to [email or web address] - Proofreader  [facility]  Office  [phone]  CC: Primary care physician; System, Pcp Not In  Note: This dictation was prepared with Dragon dictation along with smaller phrase technology. Any transcriptional errors that result from this process are unintentional.

## 2018-08-31 NOTE — Progress Notes (Signed)
George Arellano at [place] NAME: George Arellano    MR#:  [id number]  DATE OF BIRTH:  Dec 03, 1965  SUBJECTIVE:  Patient without complaint, no events overnight, possible discharge home by primary service later today  REVIEW OF SYSTEMS:  CONSTITUTIONAL: No fever, fatigue or weakness.  EYES: No blurred or double vision.  EARS, NOSE, AND THROAT: No tinnitus or ear pain.  RESPIRATORY: No cough, shortness of breath, wheezing or hemoptysis.  CARDIOVASCULAR: No chest pain, orthopnea, edema.  GASTROINTESTINAL: No nausea, vomiting, diarrhea or abdominal pain.  GENITOURINARY: No dysuria, hematuria.  ENDOCRINE: No polyuria, nocturia,  HEMATOLOGY: No anemia, easy bruising or bleeding SKIN: No rash or lesion. MUSCULOSKELETAL: No joint pain or arthritis.   NEUROLOGIC: No tingling, numbness, weakness.  PSYCHIATRY: No anxiety or depression.   ROS  DRUG ALLERGIES:   Allergies  Allergen Reactions  . Atorvastatin Hives and Itching    All over trunk of body  . Levaquin [Levofloxacin] Hives and Other (See Comments)    Rash, itching all over trunk of body    VITALS:  Blood pressure 118/71, pulse 79, temperature 98.2 F (36.8 C), temperature source Oral, resp. rate 20, height 5\' 9"  (1.753 m), weight 79.3 kg, SpO2 99 %.  PHYSICAL EXAMINATION:  GENERAL:  53 y.o.-year-old patient lying in the bed with no acute distress.  EYES: Pupils equal, round, reactive to light and accommodation. No scleral icterus. Extraocular muscles intact.  HEENT: Head atraumatic, normocephalic. Oropharynx and nasopharynx clear.  NECK:  Supple, no jugular venous distention. No thyroid enlargement, no tenderness.  LUNGS: Normal breath sounds bilaterally, no wheezing, rales,rhonchi or crepitation. No use of accessory muscles of respiration.  CARDIOVASCULAR: S1, S2 normal. No murmurs, rubs, or gallops.  ABDOMEN: Soft, nontender, nondistended. Bowel sounds present. No organomegaly or mass.   EXTREMITIES: No pedal edema, cyanosis, or clubbing.  NEUROLOGIC: Cranial nerves II through XII are intact. Muscle strength 5/5 in all extremities. Sensation intact. Gait not checked.  PSYCHIATRIC: The patient is alert and oriented x 3.  SKIN: No obvious rash, lesion, or ulcer.   Physical Exam LABORATORY PANEL:   CBC Recent Labs  Lab 08/30/18 0450  WBC 10.5  HGB 12.4*  HCT 37.0*  PLT 232   ------------------------------------------------------------------------------------------------------------------  Chemistries  Recent Labs  Lab 08/27/18 0339  08/31/18 0738  NA 133*   < > 131*  K 4.7   < > 4.0  CL 98   < > 96*  CO2 27   < > 28  GLUCOSE 147*   < > 93  BUN 13   < > 10  CREATININE 0.82   < > 0.61  CALCIUM 9.0   < > 8.6*  MG 2.1  --   --   AST 64*   < > 47*  ALT 55*   < > 65*  ALKPHOS 83   < > 67  BILITOT 1.4*   < > 0.7   < > = values in this interval not displayed.   ------------------------------------------------------------------------------------------------------------------  Cardiac Enzymes No results for input(s): TROPONINI in the last 168 hours. ------------------------------------------------------------------------------------------------------------------  RADIOLOGY:  No results found.  ASSESSMENT AND PLAN:  *Hypertension  Controlled on current regiment   *Alcohol withdrawal Resolved  Alcohol withdrawal protocol while in house   *Acute calculus cholecystitis status post lap chole Diet per surgery, possible discharge later today  *COPD exacerbation Resolved Treated with BTs prn, empiric Rocephin/azithromycin, steroids, advised to quit smoking   *Chronic tobacco smoker abuse/dependency  Cessation recommended   *slightly elevated white count  secondary to steroids also.   All the records are reviewed and case discussed with Care Management/Social Workerr. Management plans discussed with the patient, family and they are in  agreement.  CODE STATUS: full  TOTAL TIME TAKING CARE OF THIS PATIENT: 35 minutes.     POSSIBLE D/C IN 0-1 DAYS, DEPENDING ON CLINICAL CONDITION.   [name] M.D on 08/31/2018   Between 7am to 6pm - Pager - [phone]  After 6pm go to [email or web address] - password [email or web address] [facility]  Office  [phone]  CC: Primary care physician; System, Pcp Not In  Note: This dictation was prepared with Dragon dictation along with smaller phrase technology. Any transcriptional errors that result from this process are unintentional.

## 2018-08-31 NOTE — Progress Notes (Signed)
Discharge instructions reviewed with patient including followup visits and new medications.  Understanding was verbalized and all questions were answered.  IV removed without complication; patient tolerated well.  Patient discharged home via wheelchair in stable condition escorted by volunteer staff.  

## 2018-09-04 DIAGNOSIS — K812 Acute cholecystitis with chronic cholecystitis: Secondary | ICD-10-CM

## 2018-09-04 DIAGNOSIS — K429 Umbilical hernia without obstruction or gangrene: Secondary | ICD-10-CM

## 2018-09-08 ENCOUNTER — Ambulatory Visit: Payer: Medicaid Other | Attending: Neurology

## 2018-09-08 DIAGNOSIS — G4733 Obstructive sleep apnea (adult) (pediatric): Secondary | ICD-10-CM | POA: Insufficient documentation

## 2018-09-14 ENCOUNTER — Other Ambulatory Visit: Payer: Self-pay

## 2018-09-14 ENCOUNTER — Ambulatory Visit (INDEPENDENT_AMBULATORY_CARE_PROVIDER_SITE_OTHER): Payer: Medicaid Other | Admitting: Surgery

## 2018-09-14 ENCOUNTER — Encounter: Payer: Self-pay | Admitting: Surgery

## 2018-09-14 VITALS — BP 141/87 | HR 79 | Temp 98.4°F | Resp 16 | Ht 69.0 in | Wt 168.0 lb

## 2018-09-14 DIAGNOSIS — K429 Umbilical hernia without obstruction or gangrene: Secondary | ICD-10-CM

## 2018-09-14 DIAGNOSIS — K812 Acute cholecystitis with chronic cholecystitis: Secondary | ICD-10-CM

## 2018-09-14 DIAGNOSIS — Z4889 Encounter for other specified surgical aftercare: Secondary | ICD-10-CM

## 2018-09-14 NOTE — Patient Instructions (Signed)
Return as needed. No heavy for over 40 pounds for the next two weeks.  The patient is aware to call back for any questions or concerns.

## 2018-09-14 NOTE — Progress Notes (Signed)
Surgical Clinic Progress/Follow-up Note   HPI:  53 y.o. Male presents to clinic for post-op follow-up 17 Days s/p laparoscopic cholecystectomy and primary repair of patient's umbilical hernia Rosana Hoes, 08/28/2018) for gangrenous acute on chronic cholecystitis, complicated by chronic excessive alcohol, tobacco, and marijuana abuse. Patient reports complete resolution of pre-operative pain and has been tolerating regular diet with +flatus and normal BM's, denies N/V, fever/chills, CP, or SOB. His mom also proudly adds, confirmed by patient, he hasn't been drinking alcohol since he was recently discharged from [facility]. Patient clarifies he had purchased two 16 oz beers prior to hospital admission, one of which he attempted to drink over 5 hours before spilling down the drain the rest of the one and all of the second beer without wanting any more since.  Review of Systems:  Constitutional: denies fever/chills  Respiratory: denies shortness of breath, wheezing  Cardiovascular: denies chest pain, palpitations  Gastrointestinal: abdominal pain, N/V, and bowel function as per interval history Skin: Denies any other rashes or skin discolorations except post-surgical wounds as per interval history  Vital Signs:  BP (!) 141/87   Pulse 79   Temp 98.4 F (36.9 C) (Skin)   Resp 16   Ht 5\' 9"  (1.753 m)   Wt 168 lb (76.2 kg)   SpO2 97%   BMI 24.81 kg/m    Physical Exam:  Constitutional:  -- Overweight body habitus  -- Awake, alert, and oriented x3  Pulmonary:  -- No crackles -- Equal breath sounds bilaterally -- Breathing non-labored at rest Cardiovascular:  -- S1, S2 present  -- No pericardial rubs  Gastrointestinal:  -- Soft and non-distended, non-tender to palpation, no guarding/rebound tenderness -- Post-surgical incisions all well-approximated without any peri-incisional erythema or drainage -- No abdominal masses appreciated, pulsatile or otherwise  Musculoskeletal / Integumentary:  --  Wounds or skin discoloration: None appreciated except post-surgical incisions as described above (GI) -- Extremities: B/L UE and LE FROM, hands and feet warm, no edema   Imaging: No new pertinent imaging available for review  Assessment:  53 y.o. yo Male with a problem list including...  Patient Active Problem List   Diagnosis Date Noted  . Acute cholecystitis with chronic cholecystitis   . Umbilical hernia without obstruction and without gangrene   . Cholelithiasis 08/23/2018  . Nonintractable epilepsy without status epilepticus ([place]) 01/06/2018  . Arterial ischemic stroke, PCA (posterior cerebral artery), left, acute ([place]) 08/19/2017  . Hemiparesis affecting nondominant side as late effect of stroke ([place]) 08/19/2017  . 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 Acute respiratory failure with hypoxemia ([place]) 07/18/2017  . Alcoholism ([place]) 08/16/2016  . Downbeat nystagmus 08/16/2016  . 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 Community acquired pneumonia 05/05/2015    presents to clinic for post-op follow-up evaluation, doing well 17 Days s/p laparoscopic cholecystectomy and primary repair of patient's umbilical hernia Rosana Hoes, 08/28/2018) for gangrenous acute on chronic cholecystitis, complicated by chronic excessive alcohol, tobacco, and marijuana abuse.  Plan:              - advance diet as tolerated  - applauded for alcohol cessation             - okay to submerge incisions under water (baths, swimming) prn             - no heavy lifting >40 lbs x 2 more weeks, gradually resume all activities without restrictions after next 2 weeks             - apply  sunblock particularly to incisions with sun exposure to reduce pigmentation of scars             - return to clinic as needed, instructed to call office if any questions or concerns  - smoking cessation encouraged  All of the above recommendations were discussed with the patient and patient's mom, and all of patient's and family's questions  were answered to their expressed satisfaction.  -- Marilynne Drivers Rosana Hoes, MD, [place]: [facility] - Partnering for exceptional care. Office: [phone]

## 2018-10-11 ENCOUNTER — Other Ambulatory Visit: Payer: Self-pay | Admitting: Internal Medicine

## 2018-10-11 ENCOUNTER — Telehealth: Payer: Self-pay | Admitting: Internal Medicine

## 2018-10-11 MED ORDER — PREDNISONE 10 MG (21) PO TBPK: ORAL_TABLET | ORAL | 0 refills | Status: DC

## 2018-10-11 NOTE — Telephone Encounter (Signed)
Pt is aware of below recommendations and voiced his understanding.  Nothing further is needed.  

## 2018-10-11 NOTE — Telephone Encounter (Signed)
Sent in script for pred taper.

## 2018-10-11 NOTE — Telephone Encounter (Signed)
Pt is requesting a rx for prednisone or zpak to have on hand for precaution due to covid-19 concerns with his COPD dx. Pt states he is not currently having new or worsen symptoms.  DR please advise. Thanks.

## 2018-12-11 DIAGNOSIS — R569 Unspecified convulsions: Secondary | ICD-10-CM

## 2018-12-11 HISTORY — DX: Unspecified convulsions: R56.9

## 2018-12-16 ENCOUNTER — Other Ambulatory Visit: Payer: Self-pay

## 2018-12-16 ENCOUNTER — Emergency Department
Admission: EM | Admit: 2018-12-16 | Discharge: 2018-12-16 | Disposition: A | Discharge status: Home / Self Care | Payer: Medicaid Other | Attending: Emergency Medicine | Admitting: Emergency Medicine

## 2018-12-16 ENCOUNTER — Encounter: Payer: Self-pay | Admitting: Emergency Medicine

## 2018-12-16 ENCOUNTER — Emergency Department: Payer: Medicaid Other

## 2018-12-16 DIAGNOSIS — Z7982 Long term (current) use of aspirin: Secondary | ICD-10-CM | POA: Insufficient documentation

## 2018-12-16 DIAGNOSIS — Z79899 Other long term (current) drug therapy: Secondary | ICD-10-CM | POA: Diagnosis not present

## 2018-12-16 DIAGNOSIS — R569 Unspecified convulsions: Secondary | ICD-10-CM | POA: Insufficient documentation

## 2018-12-16 DIAGNOSIS — F17210 Nicotine dependence, cigarettes, uncomplicated: Secondary | ICD-10-CM | POA: Insufficient documentation

## 2018-12-16 DIAGNOSIS — J449 Chronic obstructive pulmonary disease, unspecified: Secondary | ICD-10-CM | POA: Diagnosis not present

## 2018-12-16 DIAGNOSIS — I10 Essential (primary) hypertension: Secondary | ICD-10-CM | POA: Insufficient documentation

## 2018-12-16 DIAGNOSIS — R079 Chest pain, unspecified: Secondary | ICD-10-CM

## 2018-12-16 LAB — CBC WITH DIFFERENTIAL/PLATELET
Abs Immature Granulocytes: 0.55 10*3/uL — ABNORMAL HIGH (ref 0.00–0.07)
Basophils Absolute: 0.1 10*3/uL (ref 0.0–0.1)
Basophils Relative: 1 %
Eosinophils Absolute: 0.2 10*3/uL (ref 0.0–0.5)
Eosinophils Relative: 1 %
HCT: 48.7 % (ref 39.0–52.0)
Hemoglobin: 16.2 g/dL (ref 13.0–17.0)
Immature Granulocytes: 3 %
Lymphocytes Relative: 21 %
Lymphs Abs: 3.4 10*3/uL (ref 0.7–4.0)
MCH: 31.3 pg (ref 26.0–34.0)
MCHC: 33.3 g/dL (ref 30.0–36.0)
MCV: 94.2 fL (ref 80.0–100.0)
Monocytes Absolute: 1 10*3/uL (ref 0.1–1.0)
Monocytes Relative: 7 %
Neutro Abs: 10.7 10*3/uL — ABNORMAL HIGH (ref 1.7–7.7)
Neutrophils Relative %: 67 %
Platelets: 237 10*3/uL (ref 150–400)
RBC: 5.17 MIL/uL (ref 4.22–5.81)
RDW: 14.4 % (ref 11.5–15.5)
WBC: 16 10*3/uL — ABNORMAL HIGH (ref 4.0–10.5)
nRBC: 0 % (ref 0.0–0.2)

## 2018-12-16 LAB — COMPREHENSIVE METABOLIC PANEL
ALT: 23 U/L (ref 0–44)
AST: 48 U/L — ABNORMAL HIGH (ref 15–41)
Albumin: 4.2 g/dL (ref 3.5–5.0)
Alkaline Phosphatase: 123 U/L (ref 38–126)
Anion gap: 26 — ABNORMAL HIGH (ref 5–15)
BUN: 15 mg/dL (ref 6–20)
CO2: 9 mmol/L — ABNORMAL LOW (ref 22–32)
Calcium: 9.3 mg/dL (ref 8.9–10.3)
Chloride: 104 mmol/L (ref 98–111)
Creatinine, Ser: 1.31 mg/dL — ABNORMAL HIGH (ref 0.61–1.24)
GFR calc Af Amer: >60 mL/min (ref >60)
GFR calc non Af Amer: >60 mL/min (ref >60)
Glucose, Bld: 155 mg/dL — ABNORMAL HIGH (ref 70–99)
Potassium: 4.6 mmol/L (ref 3.5–5.1)
Sodium: 139 mmol/L (ref 135–145)
Total Bilirubin: 0.7 mg/dL (ref 0.3–1.2)
Total Protein: 7.6 g/dL (ref 6.5–8.1)

## 2018-12-16 LAB — ETHANOL: Alcohol, Ethyl (B): <10 mg/dL (ref <10)

## 2018-12-16 MED ORDER — THIAMINE HCL 100 MG/ML IJ SOLN: 100.0000 mg | Freq: Every day | INTRAMUSCULAR | Status: DC

## 2018-12-16 MED ORDER — LORAZEPAM 2 MG/ML IJ SOLN: 1.0000 mg | Freq: Four times a day (QID) | INTRAMUSCULAR | Status: DC | PRN

## 2018-12-16 MED ORDER — THIAMINE HCL 100 MG/ML IJ SOLN
Freq: Once | INTRAVENOUS | Status: AC
  Administered 2018-12-16: 17:00:00 via INTRAVENOUS
  Filled 2018-12-16: qty 1000

## 2018-12-16 MED ORDER — ADULT MULTIVITAMIN W/MINERALS CH
1.0000 | ORAL_TABLET | Freq: Every day | ORAL | Status: DC
  Administered 2018-12-16: 1 via ORAL
  Filled 2018-12-16 (×2): qty 1

## 2018-12-16 MED ORDER — LORAZEPAM 1 MG PO TABS: 1.0000 mg | ORAL_TABLET | Freq: Four times a day (QID) | ORAL | Status: DC | PRN

## 2018-12-16 MED ORDER — LORAZEPAM 2 MG/ML IJ SOLN
1.0000 mg | Freq: Once | INTRAMUSCULAR | Status: AC
  Administered 2018-12-16: 16:00:00 1 mg via INTRAVENOUS
  Filled 2018-12-16: qty 1

## 2018-12-16 MED ORDER — SODIUM CHLORIDE 0.9 % IV BOLUS
1000.0000 mL | Freq: Once | INTRAVENOUS | Status: AC
  Administered 2018-12-16: 1000 mL via INTRAVENOUS

## 2018-12-16 MED ORDER — FOLIC ACID 1 MG PO TABS
1.0000 mg | ORAL_TABLET | Freq: Every day | ORAL | Status: DC
  Administered 2018-12-16: 16:00:00 1 mg via ORAL
  Filled 2018-12-16: qty 1

## 2018-12-16 MED ORDER — VITAMIN B-1 100 MG PO TABS
100.0000 mg | ORAL_TABLET | Freq: Every day | ORAL | Status: DC
  Administered 2018-12-16: 16:00:00 100 mg via ORAL
  Filled 2018-12-16: qty 1

## 2018-12-16 NOTE — Discharge Instructions (Addendum)
These make sure you are taking your medicines regularly.  Avoid alcohol while you are taking them.  Alcohol can interfere with their functioning.  Please follow-up with your seizure doctor.  Give him a call in the morning and arrange follow-up within the next week or so.  Please return for any further problems.

## 2018-12-16 NOTE — ED Triage Notes (Signed)
Patient was thought to have had a seizure 45 minutes ago. Previous seizures in 07/2018 and 08/2018. Patient also has HTN. Patient has history of alcohol abuse and resumed drinking 2 weeks ago.

## 2018-12-16 NOTE — ED Notes (Addendum)
Patient SpO2 = 89. Per patient he is supposed to use oxygen at night. Patient placed on 2L. MD notified.

## 2018-12-16 NOTE — ED Provider Notes (Addendum)
[facility] Emergency Department Provider Note   ____________________________________________   First MD Initiated Contact with Patient 12/16/18 1522     (approximate)  I have reviewed the triage vital signs and the nursing notes.   HISTORY  Chief Complaint Seizures    HPI George Arellano is a 53 y.o. male who reports he is started having seizures after he had a stroke.  He has had last seizure in January he thinks he had another one today.  He started drinking again had 324 ounce beers yesterday today was starting his first beer and had a seizure.  He says he feels fine just hot he is little sweaty.  Not running a fever.  He has no headache or chest pain or shortness of breath.         Past Medical History:  Diagnosis Date  . Acute cholecystitis with chronic cholecystitis   . Alcohol abuse   . Anxiety   . CHF (congestive heart failure) ([place])   . COPD (chronic obstructive pulmonary disease) ([place])   . Hypertension   . Seizures ([place])   . Stroke ([place])   . Umbilical hernia without obstruction and without gangrene     Patient Active Problem List   Diagnosis Date Noted  . Cholelithiasis 08/23/2018  . Nonintractable epilepsy without status epilepticus ([place]) 01/06/2018  . Arterial ischemic stroke, PCA (posterior cerebral artery), left, acute ([place]) 08/19/2017  . Hemiparesis affecting nondominant side as late effect of stroke ([place]) 08/19/2017  . 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 Acute respiratory failure with hypoxemia ([place]) 07/18/2017  . Alcoholism ([place]) 08/16/2016  . Downbeat nystagmus 08/16/2016  . 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 Community acquired pneumonia 05/05/2015    Past Surgical History:  Procedure Laterality Date  . CHOLECYSTECTOMY N/A 08/28/2018   Procedure: LAPAROSCOPIC CHOLECYSTECTOMY umbilical hernia repair;  Surgeon: Vickie Epley, MD;  Location: [facility] ORS;  Service: General;  Laterality: N/A;  . LOOP RECORDER INSERTION N/A  09/15/2017   Procedure: LOOP RECORDER INSERTION;  Surgeon: Isaias Cowman, MD;  Location: [facility];  Service: Cardiovascular;  Laterality: N/A;    Prior to Admission medications   Medication Sig Start Date End Date Taking? Authorizing Provider  albuterol (PROVENTIL HFA;VENTOLIN HFA) 108 (90 BASE) MCG/ACT inhaler Inhale 2 puffs into the lungs every 6 (six) hours as needed for wheezing or shortness of breath.    [provider]  aspirin 325 MG tablet Take 1 tablet (325 mg total) by mouth daily. 08/02/17   Max Sane, MD  budesonide-formoterol [facility]) 160-4.5 MCG/ACT inhaler Inhale 2 puffs into the lungs 2 (two) times daily.    [provider]  cholecalciferol (VITAMIN D) 1000 units tablet Take 2,000 Units by mouth daily.    [provider]  docusate sodium (COLACE) 250 MG capsule Take 1 capsule (250 mg total) by mouth daily. 08/31/18   Tylene Fantasia, PA-C  gabapentin (NEURONTIN) 300 MG capsule Take 300 mg by mouth 2 (two) times daily.  08/15/17 08/23/18  [provider]  Ipratropium-Albuterol (COMBIVENT RESPIMAT) 20-100 MCG/ACT AERS respimat Inhale 2 puffs into the lungs every 6 (six) hours as needed for wheezing or shortness of breath.    [provider]  ipratropium-albuterol (DUONEB) 0.5-2.5 (3) MG/3ML SOLN Take 3 mLs by nebulization every 6 (six) hours as needed (for shortness of breath/wheezing).    [provider]  lamoTRIgine (LAMICTAL) 100 MG tablet Take 1 tablet (100 mg total) by mouth 2 (two) times daily for  30 days. 08/08/18 09/07/18  Arta Silence, MD  lisinopril (PRINIVIL,ZESTRIL) 2.5 MG tablet Take 2.5 mg by mouth daily. 08/10/18   [provider]  metoprolol tartrate (LOPRESSOR) 50 MG tablet Take 50 mg by mouth 2 (two) times daily.  12/07/17   [provider]  Multiple Vitamins-Minerals (MULTIVITAMIN WITH MINERALS) tablet Take 1 tablet by mouth daily.    [provider]  oxyCODONE  (OXY IR/ROXICODONE) 5 MG immediate release tablet Take 1 tablet (5 mg total) by mouth every 6 (six) hours as needed for severe pain. 08/30/18   Tylene Fantasia, PA-C  predniSONE (STERAPRED UNI-PAK 21 TAB) 10 MG (21) TBPK tablet Take as directed. 10/11/18   Laverle Hobby, MD  vitamin B-12 (CYANOCOBALAMIN) 1000 MCG tablet Take 1,000 mcg by mouth daily.    [provider]    Allergies Atorvastatin and Levaquin [levofloxacin]  Family History  Problem Relation Age of Onset  . Hypertension Other     Social History Social History   Tobacco Use  . Smoking status: Current Every Day Smoker    Packs/day: 1.00    Types: Cigarettes  . Smokeless tobacco: Never Used  Substance Use Topics  . Alcohol use: Yes    Alcohol/week: 42.0 standard drinks    Types: 42 Cans of beer per week    Comment: "2 40 oz and a 24 oz" 08/08/2018  . Drug use: Yes    Types: Marijuana    Review of Systems  Constitutional: No fever/chills Eyes: No visual changes. ENT: No sore throat. Cardiovascular: Denies chest pain. Respiratory: Denies shortness of breath. Gastrointestinal: No abdominal pain.  No nausea, no vomiting.  No diarrhea.  No constipation. Genitourinary: Negative for dysuria. Musculoskeletal: Negative for back pain. Skin: Negative for rash. Neurological: Negative for headaches, focal weakness   ____________________________________________   PHYSICAL EXAM:  VITAL SIGNS: ED Triage Vitals  Enc Vitals Group     BP 12/16/18 1456 (!) 77/62     Pulse Rate 12/16/18 1456 (!) 124     Resp 12/16/18 1456 20     Temp 12/16/18 1456 97.8 F (36.6 C)     Temp Source 12/16/18 1456 Oral     SpO2 12/16/18 1456 94 %     Weight 12/16/18 1457 170 lb (77.1 kg)     Height 12/16/18 1457 5\' 9"  (1.753 m)     Head Circumference --      Peak Flow --      Pain Score 12/16/18 1458 0     Pain Loc --      Pain Edu? --      Excl. in [place]? --     Constitutional: Alert and oriented. Well appearing and  in no acute distress but he is sweating.. Eyes: Conjunctivae are normal.. Head: Atraumatic. Nose: No congestion/rhinnorhea. Mouth/Throat: Mucous membranes are moist.  Oropharynx non-erythematous. Neck: No stridor.   Cardiovascular: Rapid rate, regular rhythm. Grossly normal heart sounds.  Good peripheral circulation. Respiratory: Normal respiratory effort.  No retractions. Lungs CTAB. Gastrointestinal: Soft and nontender. No distention. No abdominal bruits. No CVA tenderness. Musculoskeletal: No lower extremity tenderness nor edema.   Neurologic:  Normal speech and language. No gross focal neurologic deficits are appreciated.  Patient is tremulous. Skin:  Skin is warm, dry and intact. No rash noted. Psychiatric: Mood and affect are normal. Speech and behavior are normal.  ____________________________________________   LABS (all labs ordered are listed, but only abnormal results are displayed)  Labs Reviewed  CBC WITH DIFFERENTIAL/PLATELET - Abnormal;  Notable for the following components:      Result Value   WBC 16.0 (*)    Neutro Abs 10.7 (*)    Abs Immature Granulocytes 0.55 (*)    All other components within normal limits  COMPREHENSIVE METABOLIC PANEL - Abnormal; Notable for the following components:   CO2 9 (*)    Glucose, Bld 155 (*)    Creatinine, Ser 1.31 (*)    AST 48 (*)    Anion gap 26 (*)    All other components within normal limits  ETHANOL  LAMOTRIGINE LEVEL   ____________________________________________  EKG  EKG read interpreted by me shows a sinus tach at a rate of 124 rightward axis no acute ST-T wave changes ____________________________________________  RADIOLOGY  ED MD interpretation:    Official radiology report(s): Dg Chest Port 1 View  Result Date: 12/16/2018 CLINICAL DATA:  Oxygen desaturation.  Seizure. EXAM: PORTABLE CHEST 1 VIEW COMPARISON:  August 23, 2018 FINDINGS: No edema or consolidation. Heart size and pulmonary vascularity are  normal. No adenopathy. There is aortic atherosclerosis. There is a loop recorder on the left. No evident bone lesions. There is calcification in the left carotid artery. IMPRESSION: No edema or consolidation. Stable cardiac silhouette. Left carotid artery calcification noted. Aortic Atherosclerosis (ICD10-I70.0). Electronically Signed   By: Lowella Grip III M.D.   On: 12/16/2018 16:40    ____________________________________________   PROCEDURES  Procedure(s) performed (including Critical Care):  Procedures   ____________________________________________   INITIAL IMPRESSION / ASSESSMENT AND PLAN / ED COURSE  Patient has very irregular seizures.  The seizures not out of his normal rare pattern.  He has been drinking.  I will encourage him to not drink and take his medicines regularly.  I have him follow-up with his regular doctor.  Has been stable here.    Lee's note patient is on 2 L of oxygen at home for sleep.  While he is awake here his oxygen saturations are fine since he nods off a little bit his sats go down into the 80s.  They come right back up when he wakes up.  He is not coughing he has no sign of aspiration his chest x-ray is clear.  I believe he is safe to go home.         ____________________________________________   FINAL CLINICAL IMPRESSION(S) / ED DIAGNOSES  Final diagnoses:  Seizure [facility])     ED Discharge Orders    None       Note:  This document was prepared using Dragon voice recognition software and may include unintentional dictation errors.    Nena Polio, MD 12/16/18 Marko Stai    Nena Polio, MD 12/16/18 [id number]

## 2018-12-19 ENCOUNTER — Telehealth: Payer: Self-pay | Admitting: Emergency Medicine

## 2018-12-19 LAB — LAMOTRIGINE LEVEL: Lamotrigine Lvl: 1.1 ug/mL — ABNORMAL LOW (ref 2.0–20.0)

## 2018-12-19 NOTE — Telephone Encounter (Signed)
Called patient and informed him of lamotrigine level low.  Asked him to call his neurologist and see if he needs to change dose. Says his neurologist is dr Manuella Ghazi.

## 2018-12-25 ENCOUNTER — Encounter: Payer: Self-pay | Admitting: Emergency Medicine

## 2018-12-25 ENCOUNTER — Other Ambulatory Visit: Payer: Self-pay

## 2018-12-25 ENCOUNTER — Emergency Department
Admission: EM | Admit: 2018-12-25 | Discharge: 2018-12-25 | Disposition: A | Discharge status: Home / Self Care | Payer: Medicaid Other | Attending: Student in an Organized Health Care Education/Training Program | Admitting: Student in an Organized Health Care Education/Training Program

## 2018-12-25 DIAGNOSIS — I509 Heart failure, unspecified: Secondary | ICD-10-CM | POA: Diagnosis not present

## 2018-12-25 DIAGNOSIS — R450 Nervousness: Secondary | ICD-10-CM | POA: Diagnosis present

## 2018-12-25 DIAGNOSIS — I110 Hypertensive heart disease with heart failure: Secondary | ICD-10-CM | POA: Insufficient documentation

## 2018-12-25 DIAGNOSIS — Z8673 Personal history of transient ischemic attack (TIA), and cerebral infarction without residual deficits: Secondary | ICD-10-CM | POA: Diagnosis not present

## 2018-12-25 DIAGNOSIS — F17210 Nicotine dependence, cigarettes, uncomplicated: Secondary | ICD-10-CM | POA: Insufficient documentation

## 2018-12-25 DIAGNOSIS — Z7982 Long term (current) use of aspirin: Secondary | ICD-10-CM | POA: Insufficient documentation

## 2018-12-25 DIAGNOSIS — Z79899 Other long term (current) drug therapy: Secondary | ICD-10-CM | POA: Insufficient documentation

## 2018-12-25 DIAGNOSIS — J449 Chronic obstructive pulmonary disease, unspecified: Secondary | ICD-10-CM | POA: Insufficient documentation

## 2018-12-25 LAB — CBC
HCT: 46.3 % (ref 39.0–52.0)
Hemoglobin: 16 g/dL (ref 13.0–17.0)
MCH: 31.4 pg (ref 26.0–34.0)
MCHC: 34.6 g/dL (ref 30.0–36.0)
MCV: 90.8 fL (ref 80.0–100.0)
Platelets: 195 10*3/uL (ref 150–400)
RBC: 5.1 MIL/uL (ref 4.22–5.81)
RDW: 14.5 % (ref 11.5–15.5)
WBC: 7.3 10*3/uL (ref 4.0–10.5)
nRBC: 0 % (ref 0.0–0.2)

## 2018-12-25 LAB — GLUCOSE, CAPILLARY: Glucose-Capillary: 119 mg/dL — ABNORMAL HIGH (ref 70–99)

## 2018-12-25 LAB — BASIC METABOLIC PANEL
Anion gap: 10 (ref 5–15)
BUN: 9 mg/dL (ref 6–20)
CO2: 26 mmol/L (ref 22–32)
Calcium: 9.5 mg/dL (ref 8.9–10.3)
Chloride: 104 mmol/L (ref 98–111)
Creatinine, Ser: 0.98 mg/dL (ref 0.61–1.24)
GFR calc Af Amer: >60 mL/min (ref >60)
GFR calc non Af Amer: >60 mL/min (ref >60)
Glucose, Bld: 111 mg/dL — ABNORMAL HIGH (ref 70–99)
Potassium: 3.9 mmol/L (ref 3.5–5.1)
Sodium: 140 mmol/L (ref 135–145)

## 2018-12-25 MED ORDER — LORAZEPAM 1 MG PO TABS
1.0000 mg | ORAL_TABLET | Freq: Once | ORAL | Status: AC
  Administered 2018-12-25: 13:00:00 1 mg via ORAL
  Filled 2018-12-25: qty 1

## 2018-12-25 MED ORDER — SODIUM CHLORIDE 0.9% FLUSH: 3.0000 mL | Freq: Once | INTRAVENOUS | Status: DC

## 2018-12-25 MED ORDER — LORAZEPAM 0.5 MG PO TABS: 0.5000 mg | ORAL_TABLET | Freq: Three times a day (TID) | ORAL | 0 refills | Status: DC | PRN

## 2018-12-25 NOTE — ED Triage Notes (Signed)
Pt in via EMS from home with c/o feeling weird. Pt with hx of seizures and 2 CVA. EMS reports he started feeling weird after 3 cups of coffee. Pt with left side deficits from previous strokes and is a high fall risk.

## 2018-12-25 NOTE — ED Notes (Signed)
NAD noted at time of D/C. Pt denies comments/concerns regarding D/C instructions. Pt taken to lobby via wheelchair by this RN where his mother is waiting for him. Pt states understanding Ativan script has been sent to [facility] in [place] for him.

## 2018-12-25 NOTE — ED Triage Notes (Signed)
Pt presents to ED via [facility] with c/o "feeling weird". Pt describes feeling weak and light-headed. Pt reports 2 recent seizures, last one being last week, states takes Lamictal, has been compliant with all medications. Pt also states hx of CVA with L sided numbness. Pt states received phone from Dr. Trena Platt office stating that his lamotrigine level with low but has not had f/u with them yet.

## 2018-12-25 NOTE — ED Notes (Signed)
Rainbow sent to lab by this RN.

## 2018-12-25 NOTE — ED Provider Notes (Signed)
[facility] Emergency Department Provider Note    First MD Initiated Contact with Patient 12/25/18 1256     (approximate)  I have reviewed the triage vital signs and the nursing notes.   HISTORY  Chief Complaint Weakness    HPI George Arellano is a 53 y.o. male plosive past medical history presents the ER with concern that he is about to have a seizure.  States he woke up this morning feeling uneasy "jittery and nervous ".  States that he is anxious that he is about to have a seizure.  Denies any numbness or tingling.  No headache.  States his been compliant with his medications.  Has tried to follow-up with neurology but has not had follow-up appointment.  Reportedly had outpatient labs showing low lamotrigine level but has not been advised on what to do with this.  States he does drink daily and last drink to 24 ounce beers last night.  He denies any history of DTs.  States he also has a history of COPD but denies any chest pain or shortness of breath.    Past Medical History:  Diagnosis Date  . Acute cholecystitis with chronic cholecystitis   . Alcohol abuse   . Anxiety   . CHF (congestive heart failure) ([place])   . COPD (chronic obstructive pulmonary disease) ([place])   . Hypertension   . Seizures ([place])   . Stroke ([place])   . Umbilical hernia without obstruction and without gangrene    Family History  Problem Relation Age of Onset  . Hypertension Other    Past Surgical History:  Procedure Laterality Date  . CHOLECYSTECTOMY N/A 08/28/2018   Procedure: LAPAROSCOPIC CHOLECYSTECTOMY umbilical hernia repair;  Surgeon: Vickie Epley, MD;  Location: [facility] ORS;  Service: General;  Laterality: N/A;  . LOOP RECORDER INSERTION N/A 09/15/2017   Procedure: LOOP RECORDER INSERTION;  Surgeon: Isaias Cowman, MD;  Location: [place] [facility];  Service: Cardiovascular;  Laterality: N/A;   Patient Active Problem List   Diagnosis Date Noted  . Cholelithiasis  08/23/2018  . Nonintractable epilepsy without status epilepticus ([place]) 01/06/2018  . Arterial ischemic stroke, PCA (posterior cerebral artery), left, acute ([place]) 08/19/2017  . Hemiparesis affecting nondominant side as late effect of stroke ([place]) 08/19/2017  . 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 Acute respiratory failure with hypoxemia ([place]) 07/18/2017  . Alcoholism ([place]) 08/16/2016  . Downbeat nystagmus 08/16/2016  . 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 Community acquired pneumonia 05/05/2015      Prior to Admission medications   Medication Sig Start Date End Date Taking? Authorizing Provider  albuterol (PROVENTIL HFA;VENTOLIN HFA) 108 (90 BASE) MCG/ACT inhaler Inhale 2 puffs into the lungs every 6 (six) hours as needed for wheezing or shortness of breath.    [provider]  aspirin 325 MG tablet Take 1 tablet (325 mg total) by mouth daily. 08/02/17   Max Sane, MD  budesonide-formoterol [facility]) 160-4.5 MCG/ACT inhaler Inhale 2 puffs into the lungs 2 (two) times daily.    [provider]  cholecalciferol (VITAMIN D) 1000 units tablet Take 2,000 Units by mouth daily.    [provider]  docusate sodium (COLACE) 250 MG capsule Take 1 capsule (250 mg total) by mouth daily. 08/31/18   Tylene Fantasia, PA-C  gabapentin (NEURONTIN) 300 MG capsule Take 300 mg by mouth 2 (two) times daily.  08/15/17 08/23/18  [provider]  Ipratropium-Albuterol (COMBIVENT RESPIMAT) 20-100 MCG/ACT AERS respimat Inhale 2 puffs into the  lungs every 6 (six) hours as needed for wheezing or shortness of breath.    [provider]  ipratropium-albuterol (DUONEB) 0.5-2.5 (3) MG/3ML SOLN Take 3 mLs by nebulization every 6 (six) hours as needed (for shortness of breath/wheezing).    [provider]  lamoTRIgine (LAMICTAL) 100 MG tablet Take 1 tablet (100 mg total) by mouth 2 (two) times daily for 30 days. 08/08/18 09/07/18  Arta Silence, MD  lisinopril  (PRINIVIL,ZESTRIL) 2.5 MG tablet Take 2.5 mg by mouth daily. 08/10/18   [provider]  metoprolol tartrate (LOPRESSOR) 50 MG tablet Take 50 mg by mouth 2 (two) times daily.  12/07/17   [provider]  Multiple Vitamins-Minerals (MULTIVITAMIN WITH MINERALS) tablet Take 1 tablet by mouth daily.    [provider]  oxyCODONE (OXY IR/ROXICODONE) 5 MG immediate release tablet Take 1 tablet (5 mg total) by mouth every 6 (six) hours as needed for severe pain. 08/30/18   Tylene Fantasia, PA-C  predniSONE (STERAPRED UNI-PAK 21 TAB) 10 MG (21) TBPK tablet Take as directed. 10/11/18   Laverle Hobby, MD  vitamin B-12 (CYANOCOBALAMIN) 1000 MCG tablet Take 1,000 mcg by mouth daily.    [provider]    Allergies Atorvastatin and Levaquin [levofloxacin]    Social History Social History   Tobacco Use  . Smoking status: Current Every Day Smoker    Packs/day: 1.00    Types: Cigarettes  . Smokeless tobacco: Never Used  Substance Use Topics  . Alcohol use: Yes    Alcohol/week: 42.0 standard drinks    Types: 42 Cans of beer per week    Comment: "2 40 oz and a 24 oz" 08/08/2018  . Drug use: Yes    Types: Marijuana    Review of Systems Patient denies headaches, rhinorrhea, blurry vision, numbness, shortness of breath, chest pain, edema, cough, abdominal pain, nausea, vomiting, diarrhea, dysuria, fevers, rashes or hallucinations unless otherwise stated above in HPI. ____________________________________________   PHYSICAL EXAM:  VITAL SIGNS: Vitals:   12/25/18 1120  BP: (!) 158/93  Pulse: 94  Resp: 18  Temp: 99 F (37.2 C)  SpO2: 94%    Constitutional: Alert and oriented.  Eyes: Conjunctivae are normal.  Head: Atraumatic. Nose: No congestion/rhinnorhea. Mouth/Throat: Mucous membranes are moist.   Neck: No stridor. Painless ROM.  Cardiovascular: Normal rate, regular rhythm. Grossly normal heart sounds.  Good peripheral circulation. Respiratory:  Normal respiratory effort.  No retractions. Lungs CTAB. Gastrointestinal: Soft and nontender. No distention. No abdominal bruits. No CVA tenderness. Genitourinary:  Musculoskeletal: No lower extremity tenderness nor edema.  No joint effusions. Neurologic:  Normal speech and language. No new gross focal neurologic deficits are appreciated. Baseline lue resting tremor.  normla fnf. No facial droop Skin:  Skin is warm, dry and intact. No rash noted. Psychiatric: Mood and affect are normal. Speech and behavior are normal.  ____________________________________________   LABS (all labs ordered are listed, but only abnormal results are displayed)  Results for orders placed or performed during the hospital encounter of 12/25/18 (from the past 24 hour(s))  Basic metabolic panel     Status: Abnormal   Collection Time: 12/25/18 11:33 AM  Result Value Ref Range   Sodium 140 135 - 145 mmol/L   Potassium 3.9 3.5 - 5.1 mmol/L   Chloride 104 98 - 111 mmol/L   CO2 26 22 - 32 mmol/L   Glucose, Bld 111 (H) 70 - 99 mg/dL   BUN 9 6 - 20 mg/dL   Creatinine,  Ser 0.98 0.61 - 1.24 mg/dL   Calcium 9.5 8.9 - 10.3 mg/dL   GFR calc non Af Amer >60 >60 mL/min   GFR calc Af Amer >60 >60 mL/min   Anion gap 10 5 - 15  CBC     Status: None   Collection Time: 12/25/18 11:33 AM  Result Value Ref Range   WBC 7.3 4.0 - 10.5 K/uL   RBC 5.10 4.22 - 5.81 MIL/uL   Hemoglobin 16.0 13.0 - 17.0 g/dL   HCT 46.3 39.0 - 52.0 %   MCV 90.8 80.0 - 100.0 fL   MCH 31.4 26.0 - 34.0 pg   MCHC 34.6 30.0 - 36.0 g/dL   RDW 14.5 11.5 - 15.5 %   Platelets 195 150 - 400 K/uL   nRBC 0.0 0.0 - 0.2 %  Glucose, capillary     Status: Abnormal   Collection Time: 12/25/18 11:38 AM  Result Value Ref Range   Glucose-Capillary 119 (H) 70 - 99 mg/dL   ____________________________________________  EKG My review and personal interpretation at Time: 11:20   Indication: jittery  Rate: 95  Rhythm: sinus Axis: normal Other: normal intervals,  no stemi ____________________________________________  RADIOLOGY   ____________________________________________   PROCEDURES  Procedure(s) performed:  Procedures    Critical Care performed: no ____________________________________________   INITIAL IMPRESSION / ASSESSMENT AND PLAN / ED COURSE  Pertinent labs & imaging results that were available during my care of the patient were reviewed by me and considered in my medical decision making (see chart for details).   DDX: Withdrawal, electrolyte abnormality, dysrhythmia, anxiety, seizure, epilepsy  George Arellano is a 53 y.o. who presents to the ED with symptoms as described above.  He is afebrile and hemodynamically stable.  Neuro exam is reassuring and at baseline.  Blood work is reassuring.  Will repeat lamotrigine level will give Ativan as the patient is very anxious appearing.  Do not feel that repeat neuroimaging clinically indicated.  Clinical Course as of Dec 25 1406  Mon Dec 25, 2018  1408 Patient reassessed.  Remained stable.  No acute distress.  Appropriate for outpatient follow-up.   [PR]    Clinical Course User Index [PR] Merlyn Lot, MD    The patient was evaluated in Emergency Department today for the symptoms described in the history of present illness. He/she was evaluated in the context of the global COVID-19 pandemic, which necessitated consideration that the patient might be at risk for infection with the SARS-CoV-2 virus that causes COVID-19. Institutional protocols and algorithms that pertain to the evaluation of patients at risk for COVID-19 are in a state of rapid change based on information released by regulatory bodies including the CDC and federal and state organizations. These policies and algorithms were followed during the patient's care in the ED.  As part of my medical decision making, I reviewed the following data within the [facility] notes reviewed and incorporated,  Labs reviewed, notes from prior ED visits and [place] Controlled Substance Database   ____________________________________________   FINAL CLINICAL IMPRESSION(S) / ED DIAGNOSES  Final diagnoses:  Jittery feeling      NEW MEDICATIONS STARTED DURING THIS VISIT:  New Prescriptions   No medications on file     Note:  This document was prepared using Dragon voice recognition software and may include unintentional dictation errors.    Merlyn Lot, MD 12/25/18 1408

## 2018-12-25 NOTE — ED Notes (Signed)
Pt states has cell phone with him, is able to update her.

## 2018-12-25 NOTE — Discharge Instructions (Addendum)
Please call Dr. Trena Platt office for close outpatient follow-up.  Return to the ER if you develop any worsening shortness of breath or chest pain.  Return for any numbness or tingling or for any additional questions or concerns.

## 2018-12-26 LAB — LAMOTRIGINE LEVEL: Lamotrigine Lvl: 3.2 ug/mL (ref 2.0–20.0)

## 2019-06-13 ENCOUNTER — Other Ambulatory Visit: Payer: Self-pay | Admitting: Physician Assistant

## 2019-06-13 DIAGNOSIS — K429 Umbilical hernia without obstruction or gangrene: Secondary | ICD-10-CM

## 2019-06-20 ENCOUNTER — Ambulatory Visit
Admission: RE | Admit: 2019-06-20 | Discharge: 2019-06-20 | Disposition: A | Discharge status: Home / Self Care | Payer: Medicaid Other | Source: Ambulatory Visit | Attending: Physician Assistant | Admitting: Physician Assistant

## 2019-06-20 ENCOUNTER — Other Ambulatory Visit: Payer: Self-pay

## 2019-06-20 DIAGNOSIS — K429 Umbilical hernia without obstruction or gangrene: Secondary | ICD-10-CM | POA: Diagnosis present

## 2019-07-25 ENCOUNTER — Other Ambulatory Visit: Payer: Self-pay | Admitting: Surgery

## 2019-07-25 DIAGNOSIS — K429 Umbilical hernia without obstruction or gangrene: Secondary | ICD-10-CM

## 2019-07-30 ENCOUNTER — Encounter: Payer: Self-pay | Admitting: *Deleted

## 2019-07-31 ENCOUNTER — Other Ambulatory Visit: Payer: Self-pay

## 2019-07-31 ENCOUNTER — Other Ambulatory Visit
Admission: RE | Admit: 2019-07-31 | Discharge: 2019-07-31 | Disposition: A | Discharge status: Home / Self Care | Payer: Medicaid Other | Source: Ambulatory Visit | Attending: Ophthalmology | Admitting: Ophthalmology

## 2019-07-31 ENCOUNTER — Other Ambulatory Visit
Admission: RE | Admit: 2019-07-31 | Discharge: 2019-07-31 | Disposition: A | Discharge status: Home / Self Care | Payer: Medicaid Other | Source: Home / Self Care | Attending: Surgery | Admitting: Surgery

## 2019-07-31 DIAGNOSIS — Z20822 Contact with and (suspected) exposure to covid-19: Secondary | ICD-10-CM | POA: Insufficient documentation

## 2019-07-31 DIAGNOSIS — Z01812 Encounter for preprocedural laboratory examination: Secondary | ICD-10-CM | POA: Diagnosis not present

## 2019-07-31 LAB — CREATININE, SERUM
Creatinine, Ser: 0.8 mg/dL (ref 0.61–1.24)
GFR calc Af Amer: >60 mL/min (ref >60)
GFR calc non Af Amer: >60 mL/min (ref >60)

## 2019-08-01 ENCOUNTER — Ambulatory Visit
Admission: RE | Admit: 2019-08-01 | Discharge: 2019-08-01 | Disposition: A | Discharge status: Home / Self Care | Payer: Medicaid Other | Source: Ambulatory Visit | Attending: Surgery | Admitting: Surgery

## 2019-08-01 ENCOUNTER — Other Ambulatory Visit: Payer: Self-pay

## 2019-08-01 DIAGNOSIS — K429 Umbilical hernia without obstruction or gangrene: Secondary | ICD-10-CM | POA: Insufficient documentation

## 2019-08-01 LAB — SARS CORONAVIRUS 2 (TAT 6-24 HRS): SARS Coronavirus 2: NEGATIVE

## 2019-08-01 MED ORDER — IOHEXOL 300 MG/ML  SOLN
100.0000 mL | Freq: Once | INTRAMUSCULAR | Status: AC | PRN
  Administered 2019-08-01: 100 mL via INTRAVENOUS

## 2019-08-01 MED ORDER — IOHEXOL 300 MG/ML  SOLN: 100.0000 mL | Freq: Once | INTRAMUSCULAR | Status: DC | PRN

## 2019-08-02 ENCOUNTER — Encounter
Admission: RE | Disposition: A | Discharge status: Home / Self Care | Payer: Self-pay | Source: Home / Self Care | Attending: Ophthalmology

## 2019-08-02 ENCOUNTER — Encounter: Payer: Self-pay | Admitting: Registered Nurse

## 2019-08-02 ENCOUNTER — Ambulatory Visit
Admission: RE | Admit: 2019-08-02 | Discharge: 2019-08-02 | Disposition: A | Discharge status: Home / Self Care | Payer: Medicaid Other | Attending: Ophthalmology | Admitting: Ophthalmology

## 2019-08-02 DIAGNOSIS — Z539 Procedure and treatment not carried out, unspecified reason: Secondary | ICD-10-CM | POA: Diagnosis not present

## 2019-08-02 HISTORY — DX: Myoneural disorder, unspecified: G70.9

## 2019-08-02 HISTORY — DX: Depression, unspecified: F32.A

## 2019-08-02 HISTORY — DX: Pure hypercholesterolemia, unspecified: E78.00

## 2019-08-02 HISTORY — DX: Cardiac arrhythmia, unspecified: I49.9

## 2019-08-02 SURGERY — PHACOEMULSIFICATION, CATARACT, WITH IOL INSERTION
Anesthesia: Topical | Laterality: Left

## 2019-08-02 MED ORDER — POLYMYXIN B-TRIMETHOPRIM 10000-0.1 UNIT/ML-% OP SOLN: 1.0000 [drp] | OPHTHALMIC | Status: DC | PRN

## 2019-08-02 MED ORDER — TETRACAINE HCL 0.5 % OP SOLN: 1.0000 [drp] | OPHTHALMIC | Status: DC | PRN

## 2019-08-02 MED ORDER — PHENYLEPHRINE HCL 10 % OP SOLN: 1.0000 [drp] | OPHTHALMIC | Status: DC | PRN

## 2019-08-02 MED ORDER — POVIDONE-IODINE 5 % OP SOLN: 1.0000 "application " | OPHTHALMIC | Status: DC | PRN

## 2019-08-02 MED ORDER — SODIUM CHLORIDE 0.9 % IV SOLN: INTRAVENOUS | Status: DC

## 2019-08-02 MED ORDER — CYCLOPENTOLATE HCL 2 % OP SOLN: 1.0000 [drp] | OPHTHALMIC | Status: DC | PRN

## 2019-08-02 SURGICAL SUPPLY — 16 items
DISSECTOR HYDRO NUCLEUS 50X22 (MISCELLANEOUS) ×12 IMPLANT
DRSG TEGADERM 2-3/8X2-3/4 SM (GAUZE/BANDAGES/DRESSINGS) ×3 IMPLANT
GLOVE BIOGEL M 6.5 STRL (GLOVE) ×3 IMPLANT
GOWN STRL REUS W/ TWL LRG LVL3 (GOWN DISPOSABLE) ×1 IMPLANT
GOWN STRL REUS W/ TWL XL LVL3 (GOWN DISPOSABLE) ×1 IMPLANT
GOWN STRL REUS W/TWL LRG LVL3 (GOWN DISPOSABLE) ×2
GOWN STRL REUS W/TWL XL LVL3 (GOWN DISPOSABLE) ×2
KNIFE 45D UP 2.3 (MISCELLANEOUS) ×3 IMPLANT
LABEL CATARACT MEDS ST (LABEL) ×3 IMPLANT
PACK CATARACT (MISCELLANEOUS) ×3 IMPLANT
PACK CATARACT KING (MISCELLANEOUS) ×3 IMPLANT
PACK EYE AFTER SURG (MISCELLANEOUS) ×3 IMPLANT
SOL BSS BAG (MISCELLANEOUS) ×3
SOLUTION BSS BAG (MISCELLANEOUS) ×1 IMPLANT
WATER STERILE IRR 250ML POUR (IV SOLUTION) ×3 IMPLANT
WIPE NON LINTING 3.25X3.25 (MISCELLANEOUS) ×3 IMPLANT

## 2019-08-06 ENCOUNTER — Other Ambulatory Visit
Admission: RE | Admit: 2019-08-06 | Discharge: 2019-08-06 | Disposition: A | Discharge status: Home / Self Care | Payer: Medicaid Other | Source: Ambulatory Visit | Attending: Ophthalmology | Admitting: Ophthalmology

## 2019-08-06 ENCOUNTER — Encounter: Payer: Self-pay | Admitting: *Deleted

## 2019-08-06 ENCOUNTER — Other Ambulatory Visit: Payer: Medicaid Other

## 2019-08-06 ENCOUNTER — Other Ambulatory Visit: Payer: Self-pay

## 2019-08-06 DIAGNOSIS — Z20822 Contact with and (suspected) exposure to covid-19: Secondary | ICD-10-CM | POA: Diagnosis not present

## 2019-08-06 DIAGNOSIS — Z01812 Encounter for preprocedural laboratory examination: Secondary | ICD-10-CM | POA: Insufficient documentation

## 2019-08-06 NOTE — Anesthesia Preprocedure Evaluation (Addendum)
Anesthesia Evaluation  Patient identified by MRN, date of birth, ID band Patient awake    Reviewed: Allergy & Precautions, H&P , NPO status , Patient's Chart, lab work & pertinent test results  Airway Mallampati: II  TM Distance: >3 FB Neck ROM: full    Dental no notable dental hx.    Pulmonary COPD (severe, O2 at night),  oxygen dependent, Current SmokerPatient did not abstain from smoking.,    Pulmonary exam normal breath sounds clear to auscultation       Cardiovascular hypertension, +CHF  Atrial Fibrillation  Rhythm:regular Rate:Normal  Had loop recorder placed at the time of stroke, no dysrhythmias found at that time. Echo for CVA workup was normal as well.    Neuro/Psych Seizures - (followed by [facility] neurology), Well Controlled,  PSYCHIATRIC DISORDERS CVA (07/2017 R-PCA territory with L hemiparesis)    GI/Hepatic (+)     substance abuse  alcohol use,   Endo/Other    Renal/GU      Musculoskeletal   Abdominal   Peds  Hematology   Anesthesia Other Findings   Reproductive/Obstetrics                         Anesthesia Physical Anesthesia Plan  ASA: III  Anesthesia Plan: MAC   Post-op Pain Management:    Induction:   PONV Risk Score and Plan: 1 and Midazolam, TIVA and Treatment may vary due to age or medical condition  Airway Management Planned:   Additional Equipment:   Intra-op Plan:   Post-operative Plan:   Informed Consent: I have reviewed the patients History and Physical, chart, labs and discussed the procedure including the risks, benefits and alternatives for the proposed anesthesia with the patient or authorized representative who has indicated his/her understanding and acceptance.       Plan Discussed with: CRNA  Anesthesia Plan Comments:         Anesthesia Quick Evaluation

## 2019-08-07 ENCOUNTER — Other Ambulatory Visit: Admission: RE | Admit: 2019-08-07 | Payer: Medicaid Other | Source: Ambulatory Visit

## 2019-08-07 ENCOUNTER — Other Ambulatory Visit: Payer: Medicaid Other

## 2019-08-07 LAB — SARS CORONAVIRUS 2 (TAT 6-24 HRS): SARS Coronavirus 2: NEGATIVE

## 2019-08-08 NOTE — Discharge Instructions (Signed)

## 2019-08-09 ENCOUNTER — Ambulatory Visit: Payer: Medicaid Other | Admitting: Anesthesiology

## 2019-08-09 ENCOUNTER — Encounter
Admission: RE | Disposition: A | Discharge status: Home / Self Care | Payer: Self-pay | Source: Home / Self Care | Attending: Ophthalmology

## 2019-08-09 ENCOUNTER — Other Ambulatory Visit: Payer: Self-pay

## 2019-08-09 ENCOUNTER — Ambulatory Visit
Admission: RE | Admit: 2019-08-09 | Discharge: 2019-08-09 | Disposition: A | Discharge status: Home / Self Care | Payer: Medicaid Other | Attending: Ophthalmology | Admitting: Ophthalmology

## 2019-08-09 DIAGNOSIS — H2513 Age-related nuclear cataract, bilateral: Secondary | ICD-10-CM | POA: Insufficient documentation

## 2019-08-09 DIAGNOSIS — I69993 Ataxia following unspecified cerebrovascular disease: Secondary | ICD-10-CM | POA: Diagnosis not present

## 2019-08-09 DIAGNOSIS — I69954 Hemiplegia and hemiparesis following unspecified cerebrovascular disease affecting left non-dominant side: Secondary | ICD-10-CM | POA: Insufficient documentation

## 2019-08-09 DIAGNOSIS — Z881 Allergy status to other antibiotic agents status: Secondary | ICD-10-CM | POA: Insufficient documentation

## 2019-08-09 DIAGNOSIS — F17200 Nicotine dependence, unspecified, uncomplicated: Secondary | ICD-10-CM | POA: Diagnosis not present

## 2019-08-09 DIAGNOSIS — I509 Heart failure, unspecified: Secondary | ICD-10-CM | POA: Diagnosis not present

## 2019-08-09 DIAGNOSIS — R569 Unspecified convulsions: Secondary | ICD-10-CM | POA: Insufficient documentation

## 2019-08-09 DIAGNOSIS — J449 Chronic obstructive pulmonary disease, unspecified: Secondary | ICD-10-CM | POA: Insufficient documentation

## 2019-08-09 DIAGNOSIS — Z9981 Dependence on supplemental oxygen: Secondary | ICD-10-CM | POA: Diagnosis not present

## 2019-08-09 DIAGNOSIS — F329 Major depressive disorder, single episode, unspecified: Secondary | ICD-10-CM | POA: Diagnosis not present

## 2019-08-09 DIAGNOSIS — H25043 Posterior subcapsular polar age-related cataract, bilateral: Secondary | ICD-10-CM | POA: Insufficient documentation

## 2019-08-09 DIAGNOSIS — Z959 Presence of cardiac and vascular implant and graft, unspecified: Secondary | ICD-10-CM | POA: Insufficient documentation

## 2019-08-09 DIAGNOSIS — I4891 Unspecified atrial fibrillation: Secondary | ICD-10-CM | POA: Diagnosis not present

## 2019-08-09 DIAGNOSIS — I110 Hypertensive heart disease with heart failure: Secondary | ICD-10-CM | POA: Diagnosis not present

## 2019-08-09 DIAGNOSIS — Z79899 Other long term (current) drug therapy: Secondary | ICD-10-CM | POA: Insufficient documentation

## 2019-08-09 DIAGNOSIS — Z888 Allergy status to other drugs, medicaments and biological substances status: Secondary | ICD-10-CM | POA: Insufficient documentation

## 2019-08-09 DIAGNOSIS — E7800 Pure hypercholesterolemia, unspecified: Secondary | ICD-10-CM | POA: Insufficient documentation

## 2019-08-09 DIAGNOSIS — G629 Polyneuropathy, unspecified: Secondary | ICD-10-CM | POA: Diagnosis not present

## 2019-08-09 DIAGNOSIS — Z7982 Long term (current) use of aspirin: Secondary | ICD-10-CM | POA: Insufficient documentation

## 2019-08-09 HISTORY — DX: Presence of dental prosthetic device (complete) (partial): Z97.2

## 2019-08-09 HISTORY — PX: CATARACT EXTRACTION W/PHACO: SHX586

## 2019-08-09 HISTORY — DX: Dyspnea, unspecified: R06.00

## 2019-08-09 SURGERY — PHACOEMULSIFICATION, CATARACT, WITH IOL INSERTION
Anesthesia: Monitor Anesthesia Care | Site: Eye | Laterality: Left

## 2019-08-09 MED ORDER — ACETAMINOPHEN 160 MG/5ML PO SOLN: 325.0000 mg | ORAL | Status: DC | PRN

## 2019-08-09 MED ORDER — LABETALOL HCL 5 MG/ML IV SOLN
5.0000 mg | INTRAVENOUS | Status: DC | PRN
  Administered 2019-08-09: 5 mg via INTRAVENOUS

## 2019-08-09 MED ORDER — TRYPAN BLUE 0.06 % OP SOLN
OPHTHALMIC | Status: DC | PRN
  Administered 2019-08-09: 0.5 mL via INTRAOCULAR

## 2019-08-09 MED ORDER — FENTANYL CITRATE (PF) 100 MCG/2ML IJ SOLN
INTRAMUSCULAR | Status: DC | PRN
  Administered 2019-08-09: 50 ug via INTRAVENOUS

## 2019-08-09 MED ORDER — ARMC OPHTHALMIC DILATING DROPS
1.0000 "application " | OPHTHALMIC | Status: DC | PRN
  Administered 2019-08-09 (×2): 1 via OPHTHALMIC

## 2019-08-09 MED ORDER — ONDANSETRON HCL 4 MG/2ML IJ SOLN: 4.0000 mg | Freq: Once | INTRAMUSCULAR | Status: DC | PRN

## 2019-08-09 MED ORDER — EPINEPHRINE PF 1 MG/ML IJ SOLN
INTRAOCULAR | Status: DC | PRN
  Administered 2019-08-09: 60 mL via OPHTHALMIC

## 2019-08-09 MED ORDER — ACETAZOLAMIDE ER 500 MG PO CP12: 500.0000 mg | ORAL_CAPSULE | Freq: Two times a day (BID) | ORAL | 1 refills | Status: DC

## 2019-08-09 MED ORDER — BRIMONIDINE TARTRATE-TIMOLOL 0.2-0.5 % OP SOLN
OPHTHALMIC | Status: DC | PRN
  Administered 2019-08-09: 1 [drp] via OPHTHALMIC

## 2019-08-09 MED ORDER — TETRACAINE HCL 0.5 % OP SOLN
OPHTHALMIC | Status: DC | PRN
  Administered 2019-08-09: 2 [drp] via OPHTHALMIC

## 2019-08-09 MED ORDER — NEOMYCIN-POLYMYXIN-DEXAMETH 3.5-10000-0.1 OP OINT
TOPICAL_OINTMENT | OPHTHALMIC | Status: DC | PRN
  Administered 2019-08-09: 1 via OPHTHALMIC

## 2019-08-09 MED ORDER — POLYMYXIN B-TRIMETHOPRIM 10000-0.1 UNIT/ML-% OP SOLN
OPHTHALMIC | Status: DC | PRN
  Administered 2019-08-09: 1 [drp] via OPHTHALMIC

## 2019-08-09 MED ORDER — PROVISC 10 MG/ML IO SOLN
INTRAOCULAR | Status: DC | PRN
  Administered 2019-08-09: 0.55 mL via INTRAOCULAR

## 2019-08-09 MED ORDER — MIDAZOLAM HCL 2 MG/2ML IJ SOLN
INTRAMUSCULAR | Status: DC | PRN
  Administered 2019-08-09: 2 mg via INTRAVENOUS
  Administered 2019-08-09 (×2): 1 mg via INTRAVENOUS

## 2019-08-09 MED ORDER — ACETAMINOPHEN 325 MG PO TABS: 325.0000 mg | ORAL_TABLET | ORAL | Status: DC | PRN

## 2019-08-09 MED ORDER — TETRACAINE HCL 0.5 % OP SOLN
1.0000 [drp] | OPHTHALMIC | Status: DC | PRN
  Administered 2019-08-09 (×4): 1 [drp] via OPHTHALMIC

## 2019-08-09 MED ORDER — NA CHONDROIT SULF-NA HYALURON 40-17 MG/ML IO SOLN
INTRAOCULAR | Status: DC | PRN
  Administered 2019-08-09 (×2): 1 mL via INTRAOCULAR

## 2019-08-09 MED ORDER — TRIAMCINOLONE ACETONIDE 40 MG/ML IO SUSP
INTRAOCULAR | Status: DC | PRN
  Administered 2019-08-09 (×2): 40 mg

## 2019-08-09 MED ORDER — CEFUROXIME OPHTHALMIC INJECTION 1 MG/0.1 ML
INJECTION | OPHTHALMIC | Status: DC | PRN
  Administered 2019-08-09: 0.1 mL via INTRACAMERAL

## 2019-08-09 SURGICAL SUPPLY — 21 items
BNDG EYE OVAL (GAUZE/BANDAGES/DRESSINGS) ×4 IMPLANT
CUP MEDICINE 2OZ PLAST GRAD ST (MISCELLANEOUS) ×2 IMPLANT
DISSECTOR HYDRO NUCLEUS 50X22 (MISCELLANEOUS) ×18 IMPLANT
DRSG TEGADERM 2-3/8X2-3/4 SM (GAUZE/BANDAGES/DRESSINGS) ×3 IMPLANT
GOWN STRL REUS W/ TWL LRG LVL3 (GOWN DISPOSABLE) ×1 IMPLANT
GOWN STRL REUS W/ TWL XL LVL3 (GOWN DISPOSABLE) ×1 IMPLANT
GOWN STRL REUS W/TWL LRG LVL3 (GOWN DISPOSABLE) ×2
GOWN STRL REUS W/TWL XL LVL3 (GOWN DISPOSABLE) ×2
KIT CUTTING IOL (CUTTING FORCEPS) ×2 IMPLANT
KNIFE 45D UP 2.3 (MISCELLANEOUS) ×3 IMPLANT
LENS IOL TECNIS ITEC 23.0 (Intraocular Lens) ×2 IMPLANT
PACK CATARACT (MISCELLANEOUS) ×3 IMPLANT
PACK DR. KING ARMS (PACKS) ×3 IMPLANT
PACK EYE AFTER SURG (MISCELLANEOUS) ×3 IMPLANT
PACK VIT ANT 23G (MISCELLANEOUS) ×2 IMPLANT
SOLUTION OPHTHALMIC SALT (MISCELLANEOUS) ×2 IMPLANT
SPONGE SURG I SPEAR (MISCELLANEOUS) ×2 IMPLANT
SUT ETHILON 10-0 CS-B-6CS-B-6 (SUTURE) ×3
SUTURE EHLN 10-0 CS-B-6CS-B-6 (SUTURE) IMPLANT
WATER STERILE IRR 250ML POUR (IV SOLUTION) ×3 IMPLANT
WIPE NON LINTING 3.25X3.25 (MISCELLANEOUS) ×3 IMPLANT

## 2019-08-09 NOTE — Transfer of Care (Deleted)
Immediate Anesthesia Transfer of Care Note  Patient: George Arellano  Procedure(s) Performed: CATARACT EXTRACTION PHACO AND INTRAOCULAR LENS PLACEMENT (IOC) LEFT 3.75  00:40.6  9.2% (Left Eye)  Patient Location: PACU  Anesthesia Type: MAC  Level of Consciousness: awake, alert  and patient cooperative  Airway and Oxygen Therapy: Patient Spontanous Breathing and Patient connected to supplemental oxygen  Post-op Assessment: Post-op Vital signs reviewed, Patient's Cardiovascular Status Stable, Respiratory Function Stable, Patent Airway and No signs of Nausea or vomiting  Post-op Vital Signs: Reviewed and stable  Complications: No apparent anesthesia complications  

## 2019-08-09 NOTE — H&P (Signed)
   I have reviewed the patient's H&P and agree with its findings. There have been no interval changes.  Layah Skousen MD Ophthalmology 

## 2019-08-09 NOTE — Op Note (Signed)
PREOPERATIVE DIAGNOSIS:  Posterior subcapsular cataract of the LEFT eye.   POSTOPERATIVE DIAGNOSIS:  Dislocated IOL of the LEFT eye with vitreous prolapse.   OPERATIVE PROCEDURE: Cataract surgery OS   SURGEON:  Marchia Meiers, MD.   ANESTHESIA:  Anesthesiologist: Ronelle Nigh, MD CRNA: Cameron Ali, CRNA  1.      Managed anesthesia care. 2.     0.5ml of Shugarcaine was instilled following the paracentesis   COMPLICATIONS:  Loss of capsular bag; anterior vitrectomy; vitreous hemorrhage; possible retinal tear and/or detachment   TECHNIQUE:   Divide and conquer   DESCRIPTION OF PROCEDURE:  The patient was examined and consented in the preoperative holding area where the aforementioned topical anesthesia was applied to the LEFT eye and then brought back to the Operating Room where the left eye was prepped and draped in the usual sterile ophthalmic fashion and a lid speculum was placed.  The surgeon and staff noted that the patient periodically jerked his eye upward without warning. They asked the patient to do his best to keep his eyes still. The patient said he would do his best, but that he had difficulty controlling his eye movements.   A paracentesis was created with the side port blade, the anterior chamber was washed out with trypan blue to stain the anterior capsule, and the anterior chamber was filled with viscoelastic. A near clear corneal incision was performed with the steel keratome. A continuous curvilinear capsulorrhexis was performed with a cystotome followed by the capsulorrhexis forceps. Hydrodissection and hydrodelineation were carried out with BSS on a blunt cannula. The lens was removed in a divide and conquer  technique and the remaining cortical material was removed with the irrigation-aspiration handpiece. The capsular bag was inflated with viscoelastic and the lens was placed in the capsular bag without complication.   As the surgeon was removing the kuglen instrument  used to position the lens, the patient moved his eye.  The kuglen caught the capsular bag as it was being removed from the eye, and pulled the majority of the capsular bag complex outside of the eye. The IOL remained in the anterior vitreous, entangled in remaining zonules. At this point, an anterior vitrectomy was performed in the usual fashion. Triescence was injected into the eye to visualize vitreous. A separate paracentesis was created. The main wound was closed with a 10-0 nylon suture. The vitreous was removed from the anterior chamber.  During this process, a hemorrhage in the posterior chamber started to come into view, and additional tissue near the lens was visualized, believed to either be remaining bag material, or possible peripheral retina.  Given these acute issues, the decision was made to pressurize the eye with viscoelastic, hydrate the wounds, inject cefurozime, and close the eye to avoid the possibility of a suprachoroidal hemorrhage or worsening retinal detachment. The malpositioned IOL remained in the posterior segment.  The wounds were found to be water tight. The eye was dressed with maxitrol ointment.  A shield was placed over the patient's eye, and the patient was discharged with Diamox 500 BID to help control eye pressure. The patient was also given drops and will follow-up with me in one day.   The surgeon will make arrangements for the patient to have an evaluation by a retina specialist, and will assist in facilitating an additional eye surgery to remove the IOL, to repair any damage to the retina, and to suture an IOL in place.  These complications, and the likely need for a second  surgery, were explained to the patient in the recovery room.  Implant Name Type Inv. Item Serial No. Manufacturer Lot No. LRB No. Used Action  LENS IOL DIOP 23.0 - [id number] Intraocular Lens LENS IOL DIOP 23.0 [id number]  Left 1 Implanted and Explanted    Procedure(s): CATARACT EXTRACTION  PHACO AND INTRAOCULAR LENS PLACEMENT (IOC) LEFT 3.75  00:40.6  9.2% (Left)  Electronically signed: Divonte Senger 08/09/2019 11:43 AM

## 2019-08-09 NOTE — Anesthesia Procedure Notes (Signed)
Procedure Name: MAC Date/Time: 08/09/2019 9:10 AM Performed by: Cameron Ali, CRNA Pre-anesthesia Checklist: Patient identified, Emergency Drugs available, Suction available, Timeout performed and Patient being monitored Patient Re-evaluated:Patient Re-evaluated prior to induction Oxygen Delivery Method: Nasal cannula Placement Confirmation: positive ETCO2

## 2019-08-09 NOTE — Transfer of Care (Signed)
Immediate Anesthesia Transfer of Care Note  Patient: George Arellano  Procedure(s) Performed: CATARACT EXTRACTION PHACO AND INTRAOCULAR LENS PLACEMENT (IOC) LEFT 3.75  00:40.6  9.2% (Left Eye)  Patient Location: PACU  Anesthesia Type: MAC  Level of Consciousness: awake, alert  and patient cooperative  Airway and Oxygen Therapy: Patient Spontanous Breathing and Patient connected to supplemental oxygen  Post-op Assessment: Post-op Vital signs reviewed, Patient's Cardiovascular Status Stable, Respiratory Function Stable, Patent Airway and No signs of Nausea or vomiting  Post-op Vital Signs: Reviewed and stable  Complications: No apparent anesthesia complications

## 2019-08-09 NOTE — Anesthesia Postprocedure Evaluation (Signed)
Anesthesia Post Note  Patient: George Arellano  Procedure(s) Performed: CATARACT EXTRACTION PHACO AND INTRAOCULAR LENS PLACEMENT (IOC) LEFT 3.75  00:40.6  9.2% (Left Eye)     Patient location during evaluation: PACU Anesthesia Type: MAC Level of consciousness: awake and alert and oriented Pain management: satisfactory to patient Vital Signs Assessment: post-procedure vital signs reviewed and stable Respiratory status: spontaneous breathing, nonlabored ventilation and respiratory function stable Cardiovascular status: blood pressure returned to baseline and stable Postop Assessment: Adequate PO intake and No signs of nausea or vomiting Anesthetic complications: no    Raliegh Ip

## 2019-08-10 ENCOUNTER — Encounter: Payer: Self-pay | Admitting: *Deleted

## 2019-08-14 ENCOUNTER — Ambulatory Visit: Payer: Medicaid Other | Admitting: Pulmonary Disease

## 2019-08-27 ENCOUNTER — Other Ambulatory Visit: Payer: Medicaid Other

## 2020-05-15 ENCOUNTER — Ambulatory Visit: Admit: 2020-05-15 | Payer: Medicaid Other | Admitting: Ophthalmology

## 2020-05-15 SURGERY — PHACOEMULSIFICATION, CATARACT, WITH IOL INSERTION
Anesthesia: Topical | Laterality: Right

## 2020-06-02 IMAGING — MR MR HEAD W/O CM
9 of 10 series · 42 of 48 positions shown · non-contrast
Comparison: 03/03/2018 CT head.  07/27/2017 MRI head.

CLINICAL DATA: 51 y/o M; woke this a.m. with dizziness. History of
stroke.

EXAM:
MRI HEAD WITHOUT CONTRAST
TECHNIQUE: Multiplanar, multiecho pulse sequences of the brain and surrounding
structures were obtained without intravenous contrast.

[Series 2: ax dwi_tracew · axial · 3.0mm · 0.83mm/px · z∈[-29,+132]mm · 8 of 55 slices shown]
[im 1/55]
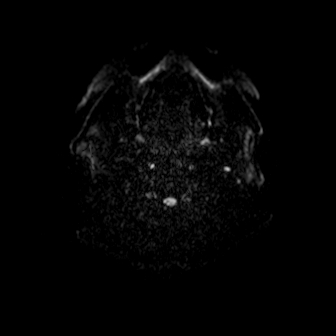
[im 8/55]
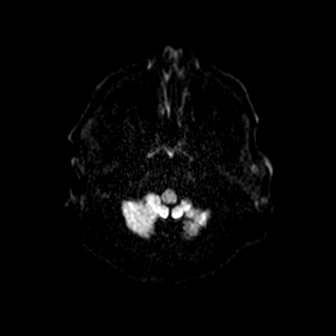
[im 16/55]
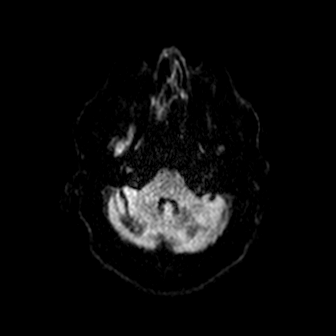
[im 24/55]
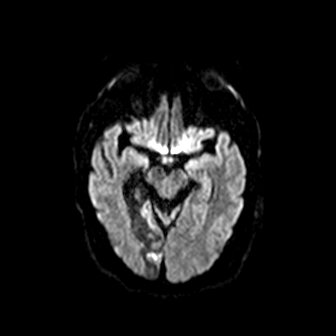
[im 31/55]
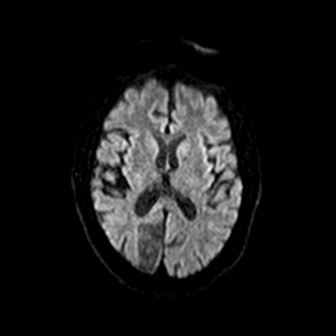
[im 39/55]
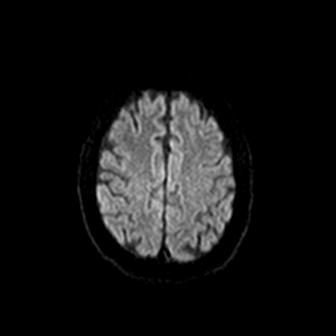
[im 47/55]
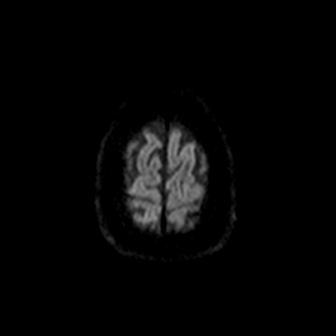
[im 55/55]
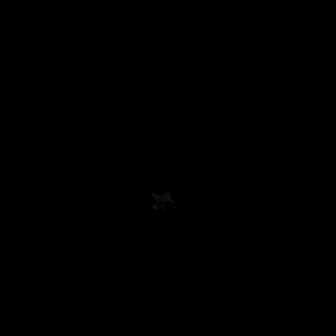

[Series 3: ax dwi_adc · axial · 3.0mm · 0.83mm/px · z∈[-29,+132]mm · 7 of 55 slices shown]
[im 1/55]
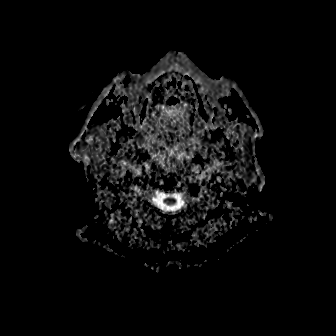
[im 10/55]
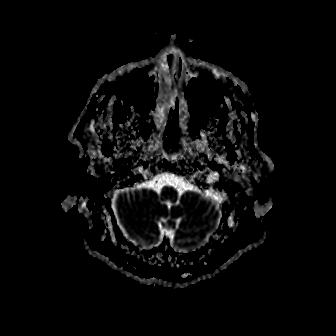
[im 19/55]
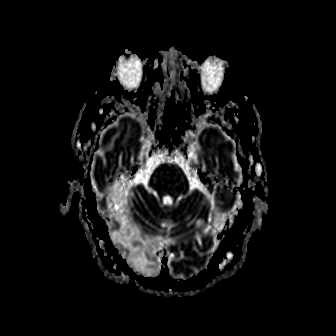
[im 28/55]
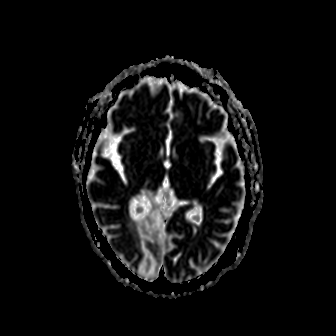
[im 37/55]
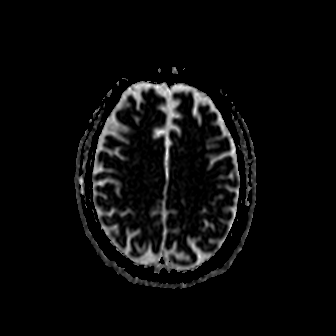
[im 46/55]
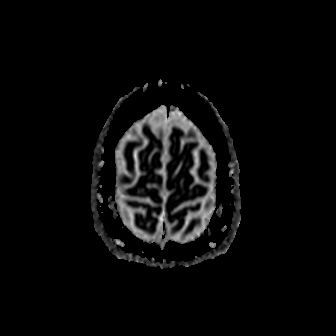
[im 55/55]
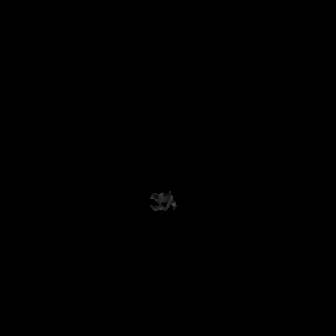

[Series 4: cor dwi_tracew · coronal · 5.0mm · 0.68mm/px · 5 of 38 slices shown]
[im 1/38]
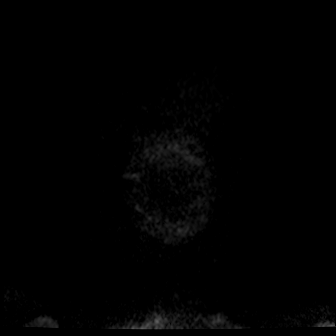
[im 10/38]
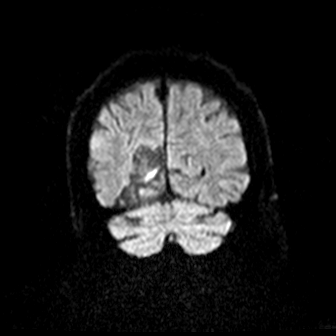
[im 19/38]
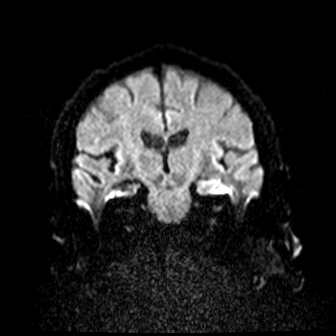
[im 28/38]
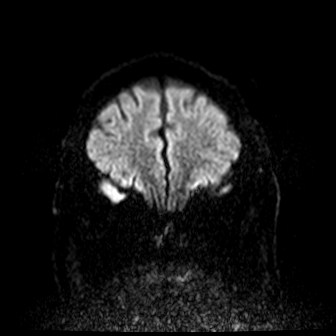
[im 38/38]
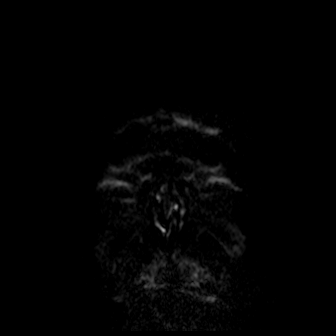

[Series 5: cor dwi_adc · coronal · 5.0mm · 0.68mm/px · 3 of 38 slices shown]
[im 1/38]
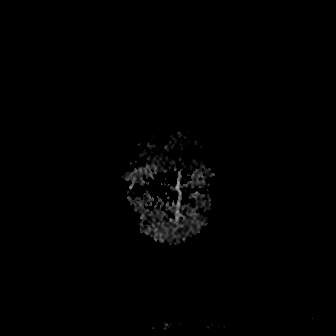
[im 10/38]
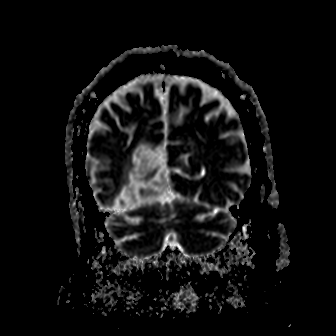
[im 19/38]
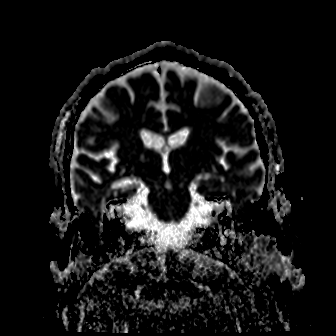

[Series 6: FLAIR · axial · 5.0mm · 1.20mm/px · z∈[-34,+133]mm · 4 of 29 slices shown]
[im 1/29]
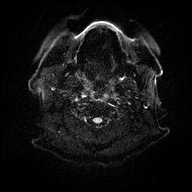
[im 10/29]
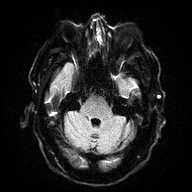
[im 19/29]
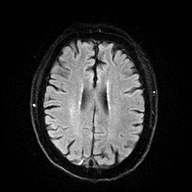
[im 29/29]
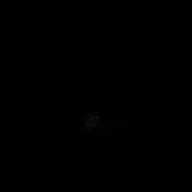

[Series 8: T2 · axial · 5.0mm · 0.45mm/px · z∈[-34,+133]mm · 4 of 29 slices shown (1 of 2)]
[im 1/29]
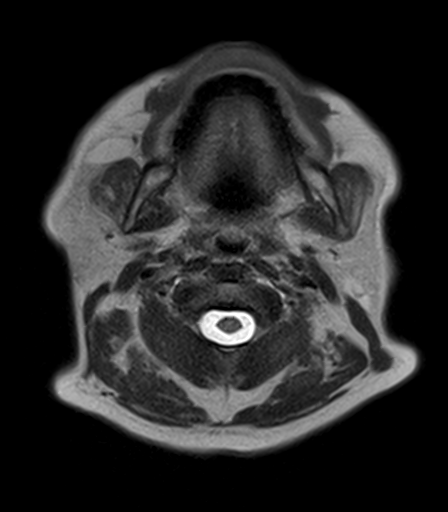
[im 10/29]
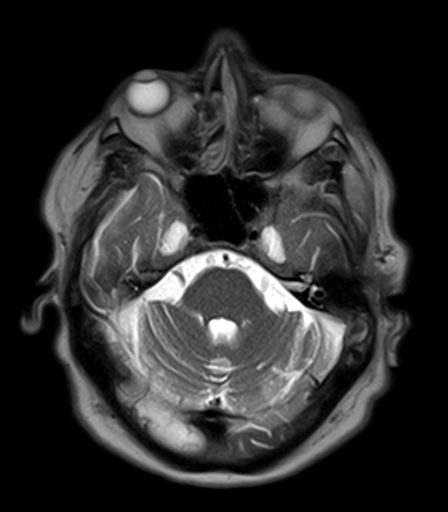
[im 19/29]
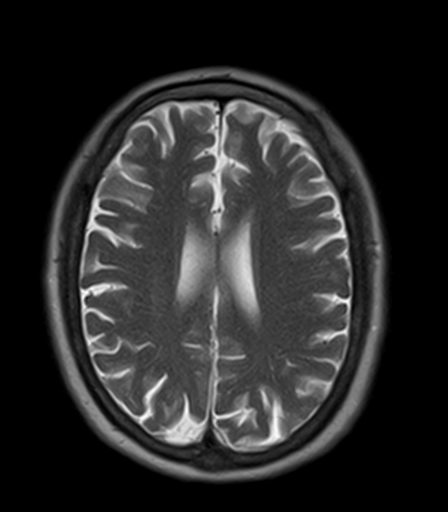
[im 29/29]
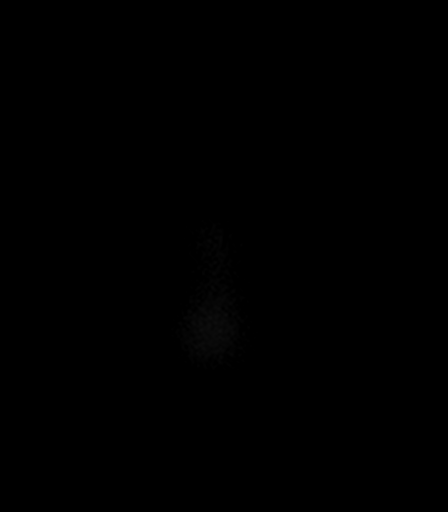

[Series 9: T1 · sagittal · 5.0mm · 0.94mm/px · 3 of 22 slices shown (1 of 2)]
[im 1/22]
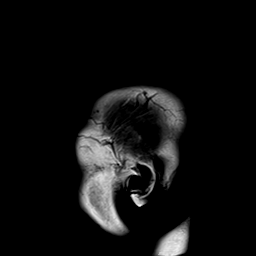
[im 11/22]
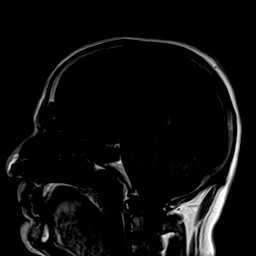
[im 22/22]
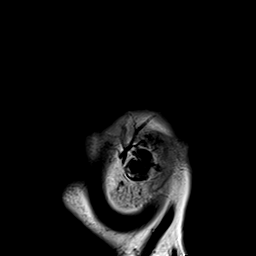

[Series 10: T1 · axial · 5.0mm · 0.90mm/px · z∈[-34,+133]mm · 4 of 29 slices shown (2 of 2)]
[im 1/29]
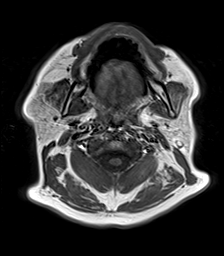
[im 10/29]
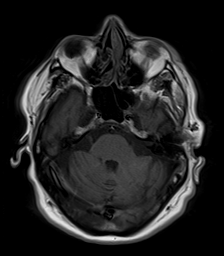
[im 19/29]
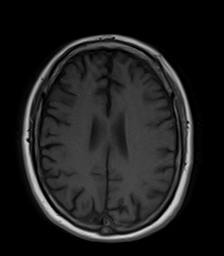
[im 29/29]
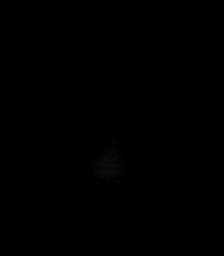

[Series 12: T2 · coronal · 5.0mm · 0.45mm/px · 4 of 33 slices shown (2 of 2)]
[im 1/33]
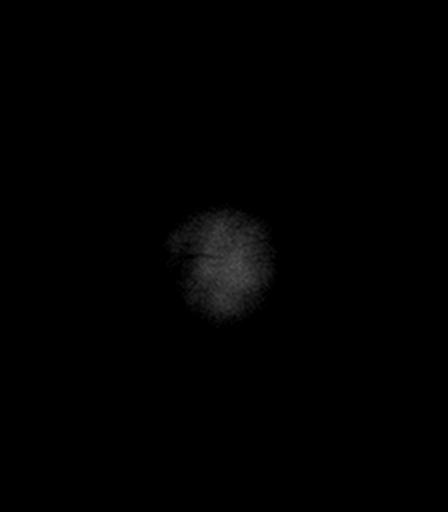
[im 11/33]
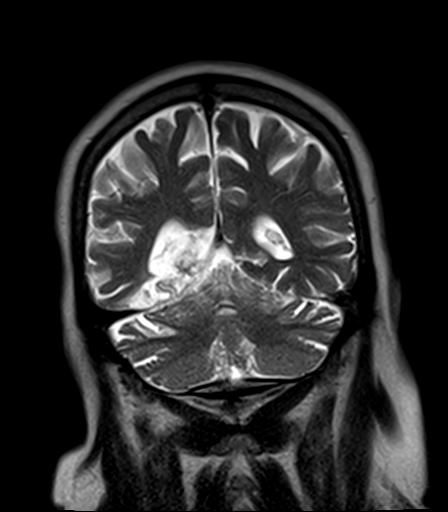
[im 22/33]
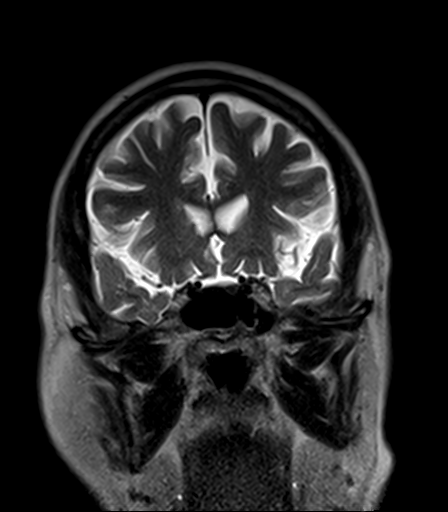
[im 33/33]
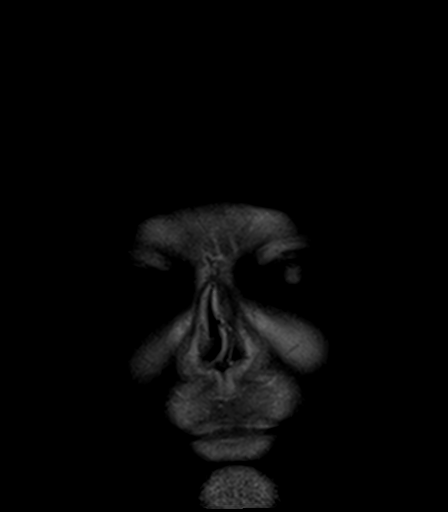

[42 of 48 positions shown; findings below may reference images not displayed]

FINDINGS: Brain: Right PCA chronic infarction involving posterior parietal,
medial temporal, and the occipital lobes. Interval laminar necrosis
and volume loss. Additional small chronic infarctions are present
within the left occipital lobe, bilateral cerebellar hemispheres,
and the right thalamus. There is faint hemosiderin staining within
the right PCA distribution infarction.

No new focus of reduced diffusion to suggest acute or early subacute
infarction. No mass effect, hydrocephalus, extra-axial collection,
or herniation. Mild volume loss of the brain.

Vascular: Normal flow voids.

Skull and upper cervical spine: Normal marrow signal.

Sinuses/Orbits: Negative.

Other: None.
IMPRESSION: 1. No acute intracranial abnormality identified.
2. Large chronic right PCA distribution infarction with interval
volume loss and laminar necrosis. Additional small chronic
infarctions in bilateral cerebellar hemispheres, left occipital
lobe, and right thalamus.
3. Mild volume loss of the brain.

By: Zaqa Der M.D.

## 2020-06-03 ENCOUNTER — Encounter: Payer: Self-pay | Admitting: Ophthalmology

## 2020-06-03 ENCOUNTER — Other Ambulatory Visit: Payer: Self-pay

## 2020-06-04 NOTE — Discharge Instructions (Signed)

## 2020-06-06 ENCOUNTER — Other Ambulatory Visit: Payer: Self-pay

## 2020-06-06 ENCOUNTER — Other Ambulatory Visit
Admission: RE | Admit: 2020-06-06 | Discharge: 2020-06-06 | Disposition: A | Discharge status: Home / Self Care | Payer: Medicaid Other | Source: Ambulatory Visit | Attending: Ophthalmology | Admitting: Ophthalmology

## 2020-06-06 DIAGNOSIS — Z01812 Encounter for preprocedural laboratory examination: Secondary | ICD-10-CM | POA: Insufficient documentation

## 2020-06-06 DIAGNOSIS — Z20822 Contact with and (suspected) exposure to covid-19: Secondary | ICD-10-CM | POA: Insufficient documentation

## 2020-06-07 LAB — SARS CORONAVIRUS 2 (TAT 6-24 HRS): SARS Coronavirus 2: NEGATIVE

## 2020-06-09 MED ORDER — POLYMYXIN B-TRIMETHOPRIM 10000-0.1 UNIT/ML-% OP SOLN: 1.0000 [drp] | OPHTHALMIC | Status: DC | PRN

## 2020-06-09 MED ORDER — TETRACAINE HCL 0.5 % OP SOLN
1.0000 [drp] | Freq: Once | OPHTHALMIC | Status: AC
  Administered 2020-06-10 (×3): 1 [drp] via OPHTHALMIC

## 2020-06-09 MED ORDER — SODIUM CHLORIDE 0.9 % IV SOLN: INTRAVENOUS | Status: DC

## 2020-06-10 ENCOUNTER — Other Ambulatory Visit: Payer: Self-pay

## 2020-06-10 ENCOUNTER — Ambulatory Visit: Payer: Self-pay | Admitting: Anesthesiology

## 2020-06-10 ENCOUNTER — Encounter: Payer: Self-pay | Admitting: Ophthalmology

## 2020-06-10 ENCOUNTER — Encounter
Admission: RE | Disposition: A | Discharge status: Home / Self Care | Payer: Self-pay | Source: Home / Self Care | Attending: Ophthalmology

## 2020-06-10 ENCOUNTER — Encounter: Payer: Self-pay | Admitting: Anesthesiology

## 2020-06-10 ENCOUNTER — Ambulatory Visit
Admission: RE | Admit: 2020-06-10 | Discharge: 2020-06-10 | Disposition: A | Discharge status: Home / Self Care | Payer: Medicaid Other | Attending: Ophthalmology | Admitting: Ophthalmology

## 2020-06-10 ENCOUNTER — Ambulatory Visit: Admission: RE | Admit: 2020-06-10 | Payer: Medicaid Other | Source: Home / Self Care | Admitting: Ophthalmology

## 2020-06-10 DIAGNOSIS — Z881 Allergy status to other antibiotic agents status: Secondary | ICD-10-CM | POA: Insufficient documentation

## 2020-06-10 DIAGNOSIS — H2511 Age-related nuclear cataract, right eye: Secondary | ICD-10-CM | POA: Insufficient documentation

## 2020-06-10 DIAGNOSIS — Z888 Allergy status to other drugs, medicaments and biological substances status: Secondary | ICD-10-CM | POA: Diagnosis not present

## 2020-06-10 DIAGNOSIS — Z7951 Long term (current) use of inhaled steroids: Secondary | ICD-10-CM | POA: Insufficient documentation

## 2020-06-10 HISTORY — PX: CATARACT EXTRACTION W/PHACO: SHX586

## 2020-06-10 SURGERY — PHACOEMULSIFICATION, CATARACT, WITH IOL INSERTION
Anesthesia: Monitor Anesthesia Care | Site: Eye | Laterality: Right

## 2020-06-10 MED ORDER — ARMC OPHTHALMIC DILATING DROPS
1.0000 "application " | OPHTHALMIC | Status: DC | PRN
  Administered 2020-06-10 (×3): 1 via OPHTHALMIC

## 2020-06-10 MED ORDER — LABETALOL HCL 5 MG/ML IV SOLN
INTRAVENOUS | Status: AC
  Filled 2020-06-10: qty 4

## 2020-06-10 MED ORDER — MIDAZOLAM HCL 2 MG/2ML IJ SOLN
INTRAMUSCULAR | Status: DC | PRN
  Administered 2020-06-10: 1 mg via INTRAVENOUS

## 2020-06-10 MED ORDER — FENTANYL CITRATE (PF) 100 MCG/2ML IJ SOLN
INTRAMUSCULAR | Status: DC | PRN
  Administered 2020-06-10: 25 ug via INTRAVENOUS

## 2020-06-10 MED ORDER — BRIMONIDINE TARTRATE-TIMOLOL 0.2-0.5 % OP SOLN
OPHTHALMIC | Status: DC | PRN
  Administered 2020-06-10: 1 [drp] via OPHTHALMIC

## 2020-06-10 MED ORDER — CEFUROXIME OPHTHALMIC INJECTION 1 MG/0.1 ML
INJECTION | OPHTHALMIC | Status: DC | PRN
  Administered 2020-06-10: 0.1 mL via INTRACAMERAL

## 2020-06-10 MED ORDER — LABETALOL HCL 5 MG/ML IV SOLN
10.0000 mg | Freq: Once | INTRAVENOUS | Status: AC
  Administered 2020-06-10: 10 mg via INTRAVENOUS

## 2020-06-10 MED ORDER — EPINEPHRINE PF 1 MG/ML IJ SOLN
INTRAOCULAR | Status: DC | PRN
  Administered 2020-06-10: 41 mL via OPHTHALMIC

## 2020-06-10 MED ORDER — LIDOCAINE HCL (PF) 2 % IJ SOLN
INTRAOCULAR | Status: DC | PRN
  Administered 2020-06-10: 1 mL

## 2020-06-10 MED ORDER — MIDAZOLAM HCL 2 MG/2ML IJ SOLN
INTRAMUSCULAR | Status: AC
  Filled 2020-06-10: qty 2

## 2020-06-10 MED ORDER — FENTANYL CITRATE (PF) 100 MCG/2ML IJ SOLN
INTRAMUSCULAR | Status: AC
  Filled 2020-06-10: qty 2

## 2020-06-10 MED ORDER — TETRACAINE HCL 0.5 % OP SOLN
OPHTHALMIC | Status: DC | PRN
  Administered 2020-06-10: 2 [drp] via OPHTHALMIC

## 2020-06-10 MED ORDER — POLYMYXIN B-TRIMETHOPRIM 10000-0.1 UNIT/ML-% OP SOLN
OPHTHALMIC | Status: DC | PRN
  Administered 2020-06-10: 1 [drp] via OPHTHALMIC

## 2020-06-10 MED ORDER — NA CHONDROIT SULF-NA HYALURON 40-17 MG/ML IO SOLN
INTRAOCULAR | Status: DC | PRN
  Administered 2020-06-10: 1 mL via INTRAOCULAR

## 2020-06-10 SURGICAL SUPPLY — 18 items
CANNULA ANT/CHMB 27GA (MISCELLANEOUS) ×6 IMPLANT
GLOVE BIO SURGEON STRL SZ8 (GLOVE) ×3 IMPLANT
GLOVE BIOGEL M 6.5 STRL (GLOVE) ×3 IMPLANT
GLOVE SURG LX 8.0 MICRO (GLOVE) ×2
GLOVE SURG LX STRL 8.0 MICRO (GLOVE) ×1 IMPLANT
GOWN STRL REUS W/ TWL LRG LVL3 (GOWN DISPOSABLE) ×2 IMPLANT
GOWN STRL REUS W/TWL LRG LVL3 (GOWN DISPOSABLE) ×4
LENS IOL DIOP 23.0 (Intraocular Lens) ×3 IMPLANT
LENS IOL TECNIS MONO 23.0 (Intraocular Lens) ×1 IMPLANT
NEEDLE FILTER BLUNT 18X 1/2SAF (NEEDLE) ×2
NEEDLE FILTER BLUNT 18X1 1/2 (NEEDLE) ×1 IMPLANT
PACK CATARACT (MISCELLANEOUS) ×3 IMPLANT
PACK CATARACT BRASINGTON LX (MISCELLANEOUS) ×3 IMPLANT
PACK EYE AFTER SURG (MISCELLANEOUS) ×3 IMPLANT
SYR 3ML LL SCALE MARK (SYRINGE) ×6 IMPLANT
SYR TB 1ML LUER SLIP (SYRINGE) ×3 IMPLANT
WATER STERILE IRR 250ML POUR (IV SOLUTION) ×3 IMPLANT
WIPE NON LINTING 3.25X3.25 (MISCELLANEOUS) ×3 IMPLANT

## 2020-06-10 NOTE — H&P (Signed)
George Arellano   Primary Care Physician:  Laneta Simmers, NP Ophthalmologist: Dr. George Ina  Pre-Procedure History & Physical: HPI:  George Arellano is a 54 y.o. male here for cataract surgery.   Past Medical History:  Diagnosis Date  . Acute cholecystitis with chronic cholecystitis   . Alcohol abuse   . Anxiety   . CHF (congestive heart failure) ([place])   . COPD (chronic obstructive pulmonary disease) ([place])   . Depression   . Dyspnea   . Dysrhythmia    A Fib  . Hypercholesterolemia   . Hypertension   . Neuromuscular disorder ([place])   . Seizures ([place]) 12/2018  . Stroke ([place]) 07/2017   balance issues, left sided deficit  . Umbilical hernia without obstruction and without gangrene   . Wears dentures    full upper and lower    Past Surgical History:  Procedure Laterality Date  . CATARACT EXTRACTION W/PHACO Left 08/09/2019   Procedure: CATARACT EXTRACTION PHACO AND INTRAOCULAR LENS PLACEMENT (IOC) LEFT 3.75  00:40.6  9.2%;  Surgeon: Marchia Meiers, MD;  Location: [place];  Service: Ophthalmology;  Laterality: Left;  . CHOLECYSTECTOMY N/A 08/28/2018   Procedure: LAPAROSCOPIC CHOLECYSTECTOMY umbilical hernia repair;  Surgeon: Vickie Epley, MD;  Location: [facility];  Service: General;  Laterality: N/A;  . LOOP RECORDER INSERTION N/A 09/15/2017   Procedure: LOOP RECORDER INSERTION;  Surgeon: Isaias Cowman, MD;  Location: [facility];  Service: Cardiovascular;  Laterality: N/A;    Prior to Admission medications   Medication Sig Start Date End Date Taking? Authorizing Provider  albuterol (PROVENTIL HFA;VENTOLIN HFA) 108 (90 BASE) MCG/ACT inhaler Inhale 2 puffs into the lungs every 6 (six) hours as needed for wheezing or shortness of breath.   Yes [provider]  aspirin EC 81 MG tablet Take 81 mg by mouth daily. Swallow whole.   Yes [provider]  B Complex-C (B-COMPLEX WITH VITAMIN C) tablet Take 1 tablet by mouth daily.   Yes  [provider]  budesonide-formoterol (SYMBICORT) 160-4.5 MCG/ACT inhaler Inhale 2 puffs into the lungs 2 (two) times daily.   Yes [provider]  busPIRone (BUSPAR) 10 MG tablet Take 10 mg by mouth 3 (three) times daily. 07/24/19  Yes [provider]  diphenhydrAMINE (BENADRYL) 25 MG tablet Take 25 mg by mouth daily.   Yes [provider]  gabapentin (NEURONTIN) 300 MG capsule Take 300 mg by mouth 2 (two) times daily.  08/15/17 07/26/20 Yes [provider]  gemfibrozil (LOPID) 600 MG tablet Take 600 mg by mouth 2 (two) times daily. 06/30/19  Yes [provider]  Ipratropium-Albuterol (COMBIVENT RESPIMAT) 20-100 MCG/ACT AERS respimat Inhale 2 puffs into the lungs in the morning and at bedtime.    Yes [provider]  ipratropium-albuterol (DUONEB) 0.5-2.5 (3) MG/3ML SOLN Take 3 mLs by nebulization every 6 (six) hours as needed (for shortness of breath/wheezing).   Yes [provider]  lamoTRIgine (LAMICTAL) 150 MG tablet Take 150 mg by mouth daily. In the morning 04/22/20  Yes [provider]  lamoTRIgine (LAMICTAL) 200 MG tablet Take 200 mg by mouth at bedtime. In the evening. 04/22/20  Yes [provider]  lisinopril (PRINIVIL,ZESTRIL) 2.5 MG tablet Take 2.5 mg by mouth daily. 08/10/18  Yes [provider]  metoprolol tartrate (LOPRESSOR) 50 MG tablet Take 50 mg by mouth 2 (two) times daily.  12/07/17  Yes [provider]  Multiple Vitamin (MULTIVITAMIN WITH MINERALS) TABS tablet Take 1 tablet by  mouth daily.   Yes [provider]  vitamin B-12 (CYANOCOBALAMIN) 1000 MCG tablet Take 1,000 mcg by mouth daily.   Yes [provider]    Allergies as of 04/21/2020 - Review Complete 08/09/2019  Allergen Reaction Noted  . Atorvastatin Hives and Itching 09/09/2017  . Levaquin [levofloxacin] Hives and Other (See Comments) 05/05/2015    Family History  Problem Relation Age of Onset   . Hypertension Other     Social History   Socioeconomic History  . Marital status: Single    Spouse name: Not on file  . Number of children: Not on file  . Years of education: Not on file  . Highest education level: Not on file  Occupational History  . Not on file  Tobacco Use  . Smoking status: Current Every Day Smoker    Packs/day: 1.00    Years: 20.00    Pack years: 20.00    Types: Cigarettes  . Smokeless tobacco: Never Used  . Tobacco comment: smoked since teenager.  1 PPD since 30s  Vaping Use  . Vaping Use: Never used  Substance and Sexual Activity  . Alcohol use: Yes    Alcohol/week: 42.0 standard drinks    Types: 42 Cans of beer per week    Comment: "2 40 oz and a 24 oz" 08/08/2018  . Drug use: Yes    Types: Marijuana  . Sexual activity: Not Currently  Other Topics Concern  . Not on file  Social History Narrative  . Not on file   Social Determinants of Health   Financial Resource Strain:   . Difficulty of Paying Living Expenses: Not on file  Food Insecurity:   . Worried About Charity fundraiser in the Last Year: Not on file  . Ran Out of Food in the Last Year: Not on file  Transportation Needs:   . Lack of Transportation (Medical): Not on file  . Lack of Transportation (Non-Medical): Not on file  Physical Activity:   . Days of Exercise per Week: Not on file  . Minutes of Exercise per Session: Not on file  Stress:   . Feeling of Stress : Not on file  Social Connections:   . Frequency of Communication with Friends and Family: Not on file  . Frequency of Social Gatherings with Friends and Family: Not on file  . Attends Religious Services: Not on file  . Active Member of Clubs or Organizations: Not on file  . Attends Archivist Meetings: Not on file  . Marital Status: Not on file  Intimate Partner Violence:   . Fear of Current or Ex-Partner: Not on file  . Emotionally Abused: Not on file  . Physically Abused: Not on file  . Sexually  Abused: Not on file    Review of Systems: See HPI, otherwise negative ROS  Physical Exam: BP (!) 162/88   Pulse (!) 105   Temp 97.8 F (36.6 C) (Temporal)   Resp 16   Ht 5\' 8"  (1.727 m)   Wt 72.6 kg   SpO2 100%   BMI 24.34 kg/m  General:   Alert,  pleasant and cooperative in NAD Head:  Normocephalic and atraumatic. Respiratory:  Normal work of breathing. Heart:  Regular rate and rhythm.  Impression/Plan: George Arellano is here for cataract surgery.  Risks, benefits, limitations, and alternatives regarding cataract surgery have been reviewed with the patient.  Questions have been answered.  All parties agreeable.   Birder Robson, MD  06/10/2020, 10:09  AM

## 2020-06-10 NOTE — Transfer of Care (Signed)
Immediate Anesthesia Transfer of Care Note  Patient: George Arellano  Procedure(s) Performed: CATARACT EXTRACTION PHACO AND INTRAOCULAR LENS PLACEMENT (IOC) RIGHT (Right Eye)  Patient Location: Short Stay  Anesthesia Type:MAC  Level of Consciousness: awake, alert  and oriented  Airway & Oxygen Therapy: Patient Spontanous Breathing  Post-op Assessment: Report given to RN and Post -op Vital signs reviewed and stable  Post vital signs: Reviewed  Last Vitals:  Vitals Value Taken Time  BP 178/91 06/10/20 1044  Temp 36.7 C 06/10/20 1044  Pulse 92 06/10/20 1044  Resp 18 06/10/20 1044  SpO2 97 % 06/10/20 1044    Last Pain:  Vitals:   06/10/20 1043  TempSrc:   PainSc: 0-No pain         Complications: No complications documented.

## 2020-06-10 NOTE — Anesthesia Preprocedure Evaluation (Signed)
Anesthesia Evaluation  Patient identified by MRN, date of birth, ID band Patient awake    Reviewed: Allergy & Precautions, NPO status , Patient's Chart, lab work & pertinent test results  Airway Mallampati: II       Dental  (+) Upper Dentures, Edentulous Lower   Pulmonary shortness of breath, pneumonia, resolved, COPD, Current Smoker,    Pulmonary exam normal breath sounds clear to auscultation       Cardiovascular hypertension, +CHF  Normal cardiovascular exam+ dysrhythmias  Rhythm:Regular Rate:Normal     Neuro/Psych Seizures -,  PSYCHIATRIC DISORDERS Anxiety Depression  Neuromuscular disease CVA    GI/Hepatic negative GI ROS, Neg liver ROS,   Endo/Other  negative endocrine ROS  Renal/GU negative Renal ROS  negative genitourinary   Musculoskeletal negative musculoskeletal ROS (+)   Abdominal   Peds negative pediatric ROS (+)  Hematology negative hematology ROS (+)   Anesthesia Other Findings .Marland KitchenPast Medical History: No date: Acute cholecystitis with chronic cholecystitis No date: Alcohol abuse No date: Anxiety No date: CHF (congestive heart failure) ([facility]) No date: COPD (chronic obstructive pulmonary disease) ([facility]) No date: Depression No date: Dyspnea No date: Dysrhythmia     Comment:  A Fib No date: Hypercholesterolemia No date: Hypertension No date: Neuromuscular disorder ([place]) 12/2018: Seizures ([place]) 07/2017: Stroke ([place])     Comment:  balance issues, left sided deficit No date: Umbilical hernia without obstruction and without gangrene No date: Wears dentures     Comment:  full upper and lower   Reproductive/Obstetrics negative OB ROS                             Anesthesia Physical Anesthesia Plan  ASA: III  Anesthesia Plan: MAC   Post-op Pain Management:    Induction:   PONV Risk Score and Plan:   Airway Management Planned: Nasal Cannula  Additional Equipment:    Intra-op Plan:   Post-operative Plan:   Informed Consent: I have reviewed the patients History and Physical, chart, labs and discussed the procedure including the risks, benefits and alternatives for the proposed anesthesia with the patient or authorized representative who has indicated his/her understanding and acceptance.       Plan Discussed with: CRNA, Anesthesiologist and Surgeon  Anesthesia Plan Comments:         Anesthesia Quick Evaluation

## 2020-06-10 NOTE — Op Note (Signed)
PREOPERATIVE DIAGNOSIS:  Nuclear sclerotic cataract of the right eye.   POSTOPERATIVE DIAGNOSIS:  Cataract   OPERATIVE PROCEDURE:@   SURGEON:  Birder Robson, MD.   ANESTHESIA:  Anesthesiologist: Neva Seat, DO CRNA: Rolla Plate, CRNA Student Nurse Anesthetist: Markus Daft, RN  1.      Managed anesthesia care. 2.      0.21ml of Shugarcaine was instilled in the eye following the paracentesis.   COMPLICATIONS:  None.   TECHNIQUE:   Stop and chop   DESCRIPTION OF PROCEDURE:  The patient was examined and consented in the preoperative holding area where the aforementioned topical anesthesia was applied to the right eye and then brought back to the Operating Room where the right eye was prepped and draped in the usual sterile ophthalmic fashion and a lid speculum was placed. A paracentesis was created with the side port blade and the anterior chamber was filled with viscoelastic. A near clear corneal incision was performed with the steel keratome. A continuous curvilinear capsulorrhexis was performed with a cystotome followed by the capsulorrhexis forceps. Hydrodissection and hydrodelineation were carried out with BSS on a blunt cannula. The lens was removed in a stop and chop  technique and the remaining cortical material was removed with the irrigation-aspiration handpiece. The capsular bag was inflated with viscoelastic and the Technis [id number]  lens was placed in the capsular bag without complication. The remaining viscoelastic was removed from the eye with the irrigation-aspiration handpiece. The wounds were hydrated. The anterior chamber was flushed with BSS and the eye was inflated to physiologic pressure. 0.20ml of Vigamox was placed in the anterior chamber. The wounds were found to be water tight. The eye was dressed with Combigan. The patient was given protective glasses to wear throughout the day and a shield with which to sleep tonight. The patient was also given drops with  which to begin a drop regimen today and will follow-up with me in one day. Implant Name Type Inv. Item Serial No. Manufacturer Lot No. LRB No. Used Action  LENS IOL DIOP 23.0 - [id number] Intraocular Lens LENS IOL DIOP 23.0 [id number]   Right 1 Implanted   Procedure(s) with comments: CATARACT EXTRACTION PHACO AND INTRAOCULAR LENS PLACEMENT (IOC) RIGHT (Right) - 2.62 0:31.5  Electronically signed: Birder Robson 06/10/2020 10:43 AM

## 2020-06-10 NOTE — Anesthesia Postprocedure Evaluation (Signed)
Anesthesia Post Note  Patient: George Arellano  Procedure(s) Performed: CATARACT EXTRACTION PHACO AND INTRAOCULAR LENS PLACEMENT (IOC) RIGHT (Right Eye)  Patient location during evaluation: Short Stay Anesthesia Type: MAC Level of consciousness: awake and alert Pain management: pain level controlled Vital Signs Assessment: post-procedure vital signs reviewed and stable Respiratory status: spontaneous breathing, nonlabored ventilation, respiratory function stable and patient connected to nasal cannula oxygen Cardiovascular status: stable and blood pressure returned to baseline Postop Assessment: no apparent nausea or vomiting Anesthetic complications: no   No complications documented.   Last Vitals:  Vitals:   06/10/20 1043 06/10/20 1044  BP: (!) 178/91 (!) 178/91  Pulse: 90 92  Resp: 14 18  Temp: 36.5 C 36.7 C  SpO2: 97% 97%    Last Pain:  Vitals:   06/10/20 1043  TempSrc:   PainSc: 0-No pain                 Rolla Plate P

## 2020-06-12 ENCOUNTER — Other Ambulatory Visit: Payer: Self-pay | Admitting: Specialist

## 2020-06-12 DIAGNOSIS — I2699 Other pulmonary embolism without acute cor pulmonale: Secondary | ICD-10-CM

## 2020-06-12 DIAGNOSIS — R0609 Other forms of dyspnea: Secondary | ICD-10-CM

## 2020-06-12 DIAGNOSIS — G4734 Idiopathic sleep related nonobstructive alveolar hypoventilation: Secondary | ICD-10-CM

## 2020-06-12 DIAGNOSIS — R0600 Dyspnea, unspecified: Secondary | ICD-10-CM

## 2020-06-12 DIAGNOSIS — R059 Cough, unspecified: Secondary | ICD-10-CM

## 2020-06-19 ENCOUNTER — Ambulatory Visit: Admission: RE | Admit: 2020-06-19 | Payer: Self-pay | Source: Ambulatory Visit

## 2021-01-09 ENCOUNTER — Emergency Department: Payer: Medicaid Other

## 2021-01-09 ENCOUNTER — Emergency Department
Admission: EM | Admit: 2021-01-09 | Discharge: 2021-01-09 | Disposition: A | Discharge status: Home / Self Care | Payer: Medicaid Other | Attending: Emergency Medicine | Admitting: Emergency Medicine

## 2021-01-09 DIAGNOSIS — R42 Dizziness and giddiness: Secondary | ICD-10-CM | POA: Insufficient documentation

## 2021-01-09 DIAGNOSIS — Z7982 Long term (current) use of aspirin: Secondary | ICD-10-CM | POA: Diagnosis not present

## 2021-01-09 DIAGNOSIS — I509 Heart failure, unspecified: Secondary | ICD-10-CM | POA: Diagnosis not present

## 2021-01-09 DIAGNOSIS — R531 Weakness: Secondary | ICD-10-CM | POA: Diagnosis present

## 2021-01-09 DIAGNOSIS — J449 Chronic obstructive pulmonary disease, unspecified: Secondary | ICD-10-CM | POA: Diagnosis not present

## 2021-01-09 DIAGNOSIS — R112 Nausea with vomiting, unspecified: Secondary | ICD-10-CM | POA: Insufficient documentation

## 2021-01-09 DIAGNOSIS — Z7951 Long term (current) use of inhaled steroids: Secondary | ICD-10-CM | POA: Insufficient documentation

## 2021-01-09 DIAGNOSIS — Z79899 Other long term (current) drug therapy: Secondary | ICD-10-CM | POA: Insufficient documentation

## 2021-01-09 DIAGNOSIS — I110 Hypertensive heart disease with heart failure: Secondary | ICD-10-CM | POA: Diagnosis not present

## 2021-01-09 DIAGNOSIS — F17210 Nicotine dependence, cigarettes, uncomplicated: Secondary | ICD-10-CM | POA: Diagnosis not present

## 2021-01-09 LAB — CBC WITH DIFFERENTIAL/PLATELET
Abs Immature Granulocytes: 0.03 10*3/uL (ref 0.00–0.07)
Basophils Absolute: 0 10*3/uL (ref 0.0–0.1)
Basophils Relative: 0 %
Eosinophils Absolute: 0.1 10*3/uL (ref 0.0–0.5)
Eosinophils Relative: 1 %
HCT: 48.4 % (ref 39.0–52.0)
Hemoglobin: 16.6 g/dL (ref 13.0–17.0)
Immature Granulocytes: 0 %
Lymphocytes Relative: 15 %
Lymphs Abs: 1.1 10*3/uL (ref 0.7–4.0)
MCH: 35.2 pg — ABNORMAL HIGH (ref 26.0–34.0)
MCHC: 34.3 g/dL (ref 30.0–36.0)
MCV: 102.8 fL — ABNORMAL HIGH (ref 80.0–100.0)
Monocytes Absolute: 0.6 10*3/uL (ref 0.1–1.0)
Monocytes Relative: 8 %
Neutro Abs: 5.4 10*3/uL (ref 1.7–7.7)
Neutrophils Relative %: 76 %
Platelets: 127 10*3/uL — ABNORMAL LOW (ref 150–400)
RBC: 4.71 MIL/uL (ref 4.22–5.81)
RDW: 11.9 % (ref 11.5–15.5)
WBC: 7.2 10*3/uL (ref 4.0–10.5)
nRBC: 0 % (ref 0.0–0.2)

## 2021-01-09 LAB — COMPREHENSIVE METABOLIC PANEL
ALT: 44 U/L (ref 0–44)
AST: 113 U/L — ABNORMAL HIGH (ref 15–41)
Albumin: 4.1 g/dL (ref 3.5–5.0)
Alkaline Phosphatase: 151 U/L — ABNORMAL HIGH (ref 38–126)
Anion gap: 13 (ref 5–15)
BUN: 6 mg/dL (ref 6–20)
CO2: 25 mmol/L (ref 22–32)
Calcium: 9 mg/dL (ref 8.9–10.3)
Chloride: 105 mmol/L (ref 98–111)
Creatinine, Ser: 0.72 mg/dL (ref 0.61–1.24)
GFR, Estimated: >60 mL/min (ref >60)
Glucose, Bld: 126 mg/dL — ABNORMAL HIGH (ref 70–99)
Potassium: 3.6 mmol/L (ref 3.5–5.1)
Sodium: 143 mmol/L (ref 135–145)
Total Bilirubin: 2 mg/dL — ABNORMAL HIGH (ref 0.3–1.2)
Total Protein: 8.3 g/dL — ABNORMAL HIGH (ref 6.5–8.1)

## 2021-01-09 LAB — ETHANOL: Alcohol, Ethyl (B): 24 mg/dL — ABNORMAL HIGH (ref <10)

## 2021-01-09 LAB — MAGNESIUM: Magnesium: 1.7 mg/dL (ref 1.7–2.4)

## 2021-01-09 LAB — TROPONIN I (HIGH SENSITIVITY)
Troponin I (High Sensitivity): 9 ng/L (ref <18)
Troponin I (High Sensitivity): 15 ng/L (ref <18)

## 2021-01-09 MED ORDER — ONDANSETRON HCL 4 MG/2ML IJ SOLN
4.0000 mg | Freq: Once | INTRAMUSCULAR | Status: AC
  Administered 2021-01-09: 4 mg via INTRAVENOUS

## 2021-01-09 MED ORDER — ONDANSETRON HCL 4 MG/2ML IJ SOLN: 4.0000 mg | Freq: Once | INTRAMUSCULAR | Status: DC

## 2021-01-09 MED ORDER — LORAZEPAM 1 MG PO TABS: 1.0000 mg | ORAL_TABLET | Freq: Once | ORAL | Status: DC

## 2021-01-09 MED ORDER — LACTATED RINGERS IV BOLUS
1000.0000 mL | Freq: Once | INTRAVENOUS | Status: AC
  Administered 2021-01-09: 1000 mL via INTRAVENOUS

## 2021-01-09 MED ORDER — LORAZEPAM 2 MG/ML IJ SOLN
1.0000 mg | Freq: Once | INTRAMUSCULAR | Status: AC
  Administered 2021-01-09: 1 mg via INTRAVENOUS
  Filled 2021-01-09: qty 1

## 2021-01-09 MED ORDER — ONDANSETRON HCL 4 MG/2ML IJ SOLN
INTRAMUSCULAR | Status: AC
  Filled 2021-01-09: qty 2

## 2021-01-09 NOTE — Discharge Instructions (Addendum)
Follow-up with your pulmonologist as regularly scheduled.  Return to the emergency department if you experience any worsening of symptoms.

## 2021-01-09 NOTE — ED Provider Notes (Signed)
[name] Emergency Department Provider Note  ____________________________________________   Event Date/Time   First MD Initiated Contact with Patient 01/09/21 1317     (approximate)  I have reviewed the triage vital signs and the nursing notes.   HISTORY  Chief Complaint Dizziness (General weakness and dizziness, )   HPI George Arellano is a 55 y.o. male with PMH significant for severe COPD, alcohol use, stroke, seizure, CHF who reports to the emergency department for concern of hypertension and episode of weakness and dizziness.  Patient states that he is usually on metoprolol for hypertension at baseline.  He had a pulmonology appointment this morning he reports and forgot to take the medication.  He reports that he was hypertensive at that office and reported after leaving the office someone brought him his medication.  Shortly after this he noted his blood pressure to be 225/115 with a home monitor cuff.  He states that he began to feel an episode of diaphoresis dizziness and presyncope.  He reports generalized weakness associated with this episode but reports feeling back to baseline at this time.  He does not report any new focal weaknesses noticed during this episode but does note that he has residual left-sided deficits from previous stroke.  He reports shortness of breath that is stable for him with significant COPD.  He does report that he uses 2 L by nasal cannula at nighttime only he denies any worsening of these chronic symptoms, reports that he is still smoking 1.5 packs/day.  He reports 2 episodes of yellow emesis with the episode today, and reports persistent nausea but denies any abdominal pain, constipation or diarrhea.  He admits to chronic alcohol use, states that he usually drinks 3-40 ounce drinks per day, but states that he has had "only a few swallows" today.  He also notes that he has not eaten anything in approximately 36 hours.       Past  Medical History:  Diagnosis Date   Acute cholecystitis with chronic cholecystitis    Alcohol abuse    Anxiety    CHF (congestive heart failure) (HCC)    COPD (chronic obstructive pulmonary disease) ([place])    Depression    Dyspnea    Dysrhythmia    A Fib   Hypercholesterolemia    Hypertension    Neuromuscular disorder ([place])    Seizures ([place]) 12/2018   Stroke ([place]) 07/2017   balance issues, left sided deficit   Umbilical hernia without obstruction and without gangrene    Wears dentures    full upper and lower    Patient Active Problem List   Diagnosis Date Noted   Cholelithiasis 08/23/2018   Nonintractable epilepsy without status epilepticus ([place]) 01/06/2018   Arterial ischemic stroke, PCA (posterior cerebral artery), left, acute ([place]) 08/19/2017   Hemiparesis affecting nondominant side as late effect of stroke ([place]) 08/19/2017   Homonymous hemianopia, 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   Acute respiratory failure with hypoxemia ([place]) 07/18/2017   Alcoholism ([place]) 08/16/2016   Downbeat nystagmus 08/16/2016   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   Community acquired pneumonia 05/05/2015    Past Surgical History:  Procedure Laterality Date   CATARACT EXTRACTION W/PHACO Left 08/09/2019   Procedure: CATARACT EXTRACTION PHACO AND INTRAOCULAR LENS PLACEMENT (IOC) LEFT 3.75  00:40.6  9.2%;  Surgeon: Marchia Meiers, MD;  Location: [facility];  Service: Ophthalmology;  Laterality: Left;   CATARACT EXTRACTION W/PHACO Right 06/10/2020   Procedure: CATARACT EXTRACTION PHACO AND INTRAOCULAR LENS PLACEMENT (IOC) RIGHT;  Surgeon: Birder Robson, MD;  Location: [facility];  Service: Ophthalmology;  Laterality: Right;  2.62 0:31.5   CHOLECYSTECTOMY N/A 08/28/2018   Procedure: LAPAROSCOPIC CHOLECYSTECTOMY umbilical hernia repair;  Surgeon: Vickie Epley, MD;  Location: [facility];  Service: General;  Laterality: N/A;   LOOP RECORDER INSERTION N/A 09/15/2017   Procedure: LOOP RECORDER INSERTION;  Surgeon:  Isaias Cowman, MD;  Location: [facility];  Service: Cardiovascular;  Laterality: N/A;    Prior to Admission medications   Medication Sig Start Date End Date Taking? Authorizing Provider  albuterol (PROVENTIL HFA;VENTOLIN HFA) 108 (90 BASE) MCG/ACT inhaler Inhale 2 puffs into the lungs every 6 (six) hours as needed for wheezing or shortness of breath.    [provider]  aspirin EC 81 MG tablet Take 81 mg by mouth daily. Swallow whole.    [provider]  B Complex-C (B-COMPLEX WITH VITAMIN C) tablet Take 1 tablet by mouth daily.    [provider]  budesonide-formoterol (SYMBICORT) 160-4.5 MCG/ACT inhaler Inhale 2 puffs into the lungs 2 (two) times daily.    [provider]  busPIRone (BUSPAR) 10 MG tablet Take 10 mg by mouth 3 (three) times daily. 07/24/19   [provider]  diphenhydrAMINE (BENADRYL) 25 MG tablet Take 25 mg by mouth daily.    [provider]  gabapentin (NEURONTIN) 300 MG capsule Take 300 mg by mouth 2 (two) times daily.  08/15/17 07/26/20  [provider]  gemfibrozil (LOPID) 600 MG tablet Take 600 mg by mouth 2 (two) times daily. 06/30/19   [provider]  Ipratropium-Albuterol (COMBIVENT RESPIMAT) 20-100 MCG/ACT AERS respimat Inhale 2 puffs into the lungs in the morning and at bedtime.     [provider]  ipratropium-albuterol (DUONEB) 0.5-2.5 (3) MG/3ML SOLN Take 3 mLs by nebulization every 6 (six) hours as needed (for shortness of breath/wheezing).    [provider]  lamoTRIgine (LAMICTAL) 150 MG tablet Take 150 mg by mouth daily. In the morning 04/22/20   [provider]  lamoTRIgine (LAMICTAL) 200 MG tablet Take 200 mg by mouth at bedtime. In the evening. 04/22/20   [provider]  lisinopril (PRINIVIL,ZESTRIL) 2.5 MG tablet Take 2.5 mg by mouth daily. 08/10/18   [provider]  metoprolol tartrate (LOPRESSOR) 50 MG tablet Take 50 mg by  mouth 2 (two) times daily.  12/07/17   [provider]  Multiple Vitamin (MULTIVITAMIN WITH MINERALS) TABS tablet Take 1 tablet by mouth daily.    [provider]  vitamin B-12 (CYANOCOBALAMIN) 1000 MCG tablet Take 1,000 mcg by mouth daily.    [provider]    Allergies Atorvastatin and Levaquin [levofloxacin]  Family History  Problem Relation Age of Onset   Hypertension Other     Social History Social History   Tobacco Use   Smoking status: Every Day    Packs/day: 1.00    Years: 20.00    Pack years: 20.00    Types: Cigarettes   Smokeless tobacco: Never   Tobacco comments:    smoked since teenager.  1 PPD since 30s  Vaping Use   Vaping Use: Never used  Substance Use Topics   Alcohol use: Yes    Alcohol/week: 42.0 standard drinks    Types: 42 Cans of beer per week    Comment: "2 40 oz and a 24 oz" 08/08/2018   Drug use: Yes    Types: Marijuana    Review of Systems Constitutional: +generalized weakness, +presyncope, No  fever/chills Eyes: No visual changes. ENT: No sore throat. Cardiovascular: Denies chest pain. Respiratory: + Chronic shortness of breath. Gastrointestinal: No abdominal pain.  + nausea, + vomiting.  No diarrhea.  No constipation. Genitourinary: Negative for dysuria. Musculoskeletal: Negative for back pain. Skin: Negative for rash. Neurological: Negative for headaches, focal weakness or numbness.   ____________________________________________   PHYSICAL EXAM:  VITAL SIGNS: ED Triage Vitals  Enc Vitals Group     BP 01/09/21 1315 (!) 125/107     Pulse Rate 01/09/21 1313 (!) 108     Resp 01/09/21 1313 17     Temp 01/09/21 1313 98.9 F (37.2 C)     Temp Source 01/09/21 1313 Oral     SpO2 01/09/21 1313 98 %     Weight 01/09/21 1311 165 lb 5.5 oz (75 kg)     Height 01/09/21 1311 $RemoveBefor'5\' 8"'[id number]$  (1.727 m)     Head Circumference --      Peak Flow --      Pain Score 01/09/21 1311 0     Pain Loc --      Pain Edu? --      Excl.  in [place]? --    Constitutional: Alert and oriented. Well appearing and in no acute distress. Eyes: Conjunctivae are normal. PERRL. EOMI. Head: Atraumatic. Nose: No congestion/rhinnorhea. Mouth/Throat: Mucous membranes are moist.  Neck: No stridor.   Cardiovascular: Normal rate, regular rhythm. Grossly normal heart sounds.  Good peripheral circulation. Respiratory: Normal respiratory effort.  No retractions.  Diffuse wheezing and rhonchi appreciated. Gastrointestinal: Soft and nontender. No distention. No abdominal bruits. No CVA tenderness. Musculoskeletal: No lower extremity tenderness nor edema.  No joint effusions. Neurologic:  Normal speech and language.  Cranial nerves II through XII grossly intact.  Gross motor function equal bilaterally, however residual left-sided deficit noted with pronator drift on upper extremity left and mildly decreased grip strength.  No gait instability. Skin:  Skin is warm, dry and intact. No rash noted. Psychiatric: Mood and affect are normal. Speech and behavior are normal.  ____________________________________________   LABS (all labs ordered are listed, but only abnormal results are displayed)  Labs Reviewed  CBC WITH DIFFERENTIAL/PLATELET - Abnormal; Notable for the following components:      Result Value   MCV 102.8 (*)    MCH 35.2 (*)    Platelets 127 (*)    All other components within normal limits  COMPREHENSIVE METABOLIC PANEL - Abnormal; Notable for the following components:   Glucose, Bld 126 (*)    Total Protein 8.3 (*)    AST 113 (*)    Alkaline Phosphatase 151 (*)    Total Bilirubin 2.0 (*)    All other components within normal limits  ETHANOL - Abnormal; Notable for the following components:   Alcohol, Ethyl (B) 24 (*)    All other components within normal limits  MAGNESIUM  TROPONIN I (HIGH SENSITIVITY)  TROPONIN I (HIGH SENSITIVITY)   ____________________________________________  EKG  Sinus tachycardia with a rate of 111.   Right axis deviation with no ST elevation or depression. ____________________________________________  RADIOLOGY Lenoria Farrier, personally viewed and evaluated these images (plain radiographs) as part of my medical decision making, as well as reviewing the written report by the radiologist.  ED provider interpretation: No evidence of acute focal consolidation  Official radiology report(s): DG Chest 2 View  Result Date: 01/09/2021 CLINICAL DATA:  Weakness EXAM: CHEST - 2 VIEW COMPARISON:  Radiograph 12/16/2018 FINDINGS: Unchanged cardiomediastinal silhouette. There is  no new focal airspace consolidation. There is no large pleural effusion or visible pneumothorax. There is no acute osseous abnormality. Cardiac loop recorder overlies the left medial chest. IMPRESSION: No focal airspace consolidation. Electronically Signed   By: Maurine Simmering   On: 01/09/2021 14:35     ____________________________________________   INITIAL IMPRESSION / ASSESSMENT AND PLAN / ED COURSE  As part of my medical decision making, I reviewed the following data within the [place] notes reviewed and incorporated, Labs reviewed, Radiograph reviewed, Evaluated by EM attending Dr. Charna Archer, and Notes from prior ED visits        Patient is a 55 year old male with significant history as described above who presents to the emergency department for episode of hypertension, feeling generalized weakness, dizzy and presyncopal.  See HPI for further details.  In triage, patient was noted to have blood pressure 125/107, mildly tachycardic with a rate of 108, otherwise vitals were within normal limits.  Physical exam demonstrates diffuse wheezing and rhonchi over the bilateral lung fields, however patient states no increased shortness of breath and feels that he is at his stable for his COPD.  No new neurologic deficits were appreciated, as patient has reported history of left-sided deficits.  Laboratory  evaluation demonstrates a mildly increased alcohol of 24, normal magnesium.  CMP with elevated AST and alk phos with a total bili of 2.0.  Otherwise grossly unremarkable.  CBC with some increased MCV and MCH but otherwise grossly within normal limits.  Troponin negative.  Chest x-ray does not show any focal deficits and EKG is grossly unremarkable.  At this time have treated patient with 1 L fluid bolus as well as Zofran and he reports some improvement.  He later had some additional nausea and vomiting and was given Ativan for which also improved his symptoms.  He did have 1 episode of hypoxia, however the patient had fallen asleep and was sideways in the bed and he reports that he usually uses 2 L by nasal cannula when at home and sleeping.  Otherwise his vitals have remained normotensive, nonhypoxic.  At this time, feel the patient is stable for discharge with close follow-up.  Patient also seen and examined by Dr. Charna Archer who agrees with plan.  Discussed with patient that he should slowly decrease his alcohol use and decrease his smoking when possible.  Patient is understandable and amenable with plan.  He stable this time for outpatient follow-up.      ____________________________________________   FINAL CLINICAL IMPRESSION(S) / ED DIAGNOSES  Final diagnoses:  Generalized weakness  Chronic obstructive pulmonary disease, unspecified COPD type [facility])     ED Discharge Orders     None        Note:  This document was prepared using Dragon voice recognition software and may include unintentional dictation errors.    Marlana Salvage, PA 01/09/21 1956    Blake Divine, MD 01/10/21 1053

## 2021-01-09 NOTE — ED Triage Notes (Signed)
Htn , dizziness, weakness, no falls x 1 day

## 2021-01-09 NOTE — ED Provider Notes (Signed)
-----------------------------------------   5:53 PM on 01/09/2021 ----------------------------------------- I have personally seen and evaluated the patient.  After Ativan patient states he is feeling much better.  Highly suspect withdrawal symptoms.  Currently patient appears well with no complaints.  Given his reassuring work-up including negative troponin x2 I believe the patient is safe for discharge home.  Patient is agreeable to plan of care.   Harvest Dark, MD 01/09/21 1753

## 2022-04-13 ENCOUNTER — Emergency Department
Admission: EM | Admit: 2022-04-13 | Discharge: 2022-04-13 | Disposition: A | Discharge status: Home / Self Care | Payer: Medicaid Other | Attending: Emergency Medicine | Admitting: Emergency Medicine

## 2022-04-13 ENCOUNTER — Other Ambulatory Visit: Payer: Self-pay

## 2022-04-13 DIAGNOSIS — J449 Chronic obstructive pulmonary disease, unspecified: Secondary | ICD-10-CM | POA: Diagnosis not present

## 2022-04-13 DIAGNOSIS — Z79899 Other long term (current) drug therapy: Secondary | ICD-10-CM | POA: Insufficient documentation

## 2022-04-13 DIAGNOSIS — I10 Essential (primary) hypertension: Secondary | ICD-10-CM | POA: Diagnosis not present

## 2022-04-13 LAB — URINALYSIS, ROUTINE W REFLEX MICROSCOPIC
Bacteria, UA: NONE SEEN
Bilirubin Urine: NEGATIVE
Glucose, UA: NEGATIVE mg/dL
Hgb urine dipstick: NEGATIVE
Ketones, ur: NEGATIVE mg/dL
Nitrite: NEGATIVE
Protein, ur: NEGATIVE mg/dL
Specific Gravity, Urine: 1.015 (ref 1.005–1.030)
pH: 5 (ref 5.0–8.0)

## 2022-04-13 LAB — CBC
HCT: 47.8 % (ref 39.0–52.0)
Hemoglobin: 15.9 g/dL (ref 13.0–17.0)
MCH: 34.3 pg — ABNORMAL HIGH (ref 26.0–34.0)
MCHC: 33.3 g/dL (ref 30.0–36.0)
MCV: 103.2 fL — ABNORMAL HIGH (ref 80.0–100.0)
Platelets: 156 10*3/uL (ref 150–400)
RBC: 4.63 MIL/uL (ref 4.22–5.81)
RDW: 13.1 % (ref 11.5–15.5)
WBC: 7.6 10*3/uL (ref 4.0–10.5)
nRBC: 0 % (ref 0.0–0.2)

## 2022-04-13 LAB — BASIC METABOLIC PANEL
Anion gap: 10 (ref 5–15)
BUN: 6 mg/dL (ref 6–20)
CO2: 25 mmol/L (ref 22–32)
Calcium: 9.3 mg/dL (ref 8.9–10.3)
Chloride: 103 mmol/L (ref 98–111)
Creatinine, Ser: 0.68 mg/dL (ref 0.61–1.24)
GFR, Estimated: >60 mL/min (ref >60)
Glucose, Bld: 112 mg/dL — ABNORMAL HIGH (ref 70–99)
Potassium: 3.3 mmol/L — ABNORMAL LOW (ref 3.5–5.1)
Sodium: 138 mmol/L (ref 135–145)

## 2022-04-13 MED ORDER — ALBUTEROL SULFATE (2.5 MG/3ML) 0.083% IN NEBU
2.5000 mg | INHALATION_SOLUTION | Freq: Once | RESPIRATORY_TRACT | Status: AC
  Administered 2022-04-13: 2.5 mg via RESPIRATORY_TRACT
  Filled 2022-04-13: qty 3

## 2022-04-13 NOTE — ED Provider Notes (Signed)
[facility] Provider Note  Patient Contact: 6:00 PM (approximate)   History   Hypotension   HPI  George Arellano is a 56 y.o. male who presents the emergency department for evaluation of his blood pressure.  Patient is being seen by cardiology for rather labile blood pressure.  Patient had been running hypertensive, had been placed on amlodipine and now is experiencing periods of hypertension and periods of hypotension.  Patient states that today he was not feeling well, was out shopping so he went to the fire department to have his blood pressure checked.  Blood pressure was up at that time and EMS was called to transport.  During the transport patient had a blood pressure of 80 systolic.  Patient is currently now hypertensive here in the ED.  He has no headache, chest pain, shortness of breath outside of his chronic stable shortness of breath.  Patient does have a history of COPD.  He does have a history of a previous CVA.  Patient just finished wearing a Holter monitor for cardiology and will follow-up with him for the results.     Physical Exam   Triage Vital Signs: ED Triage Vitals  Enc Vitals Group     BP 04/13/22 1646 (!) 176/83     Pulse Rate 04/13/22 1646 97     Resp 04/13/22 1646 18     Temp 04/13/22 1646 98.9 F (37.2 C)     Temp src --      SpO2 04/13/22 1646 97 %     Weight --      Height --      Head Circumference --      Peak Flow --      Pain Score 04/13/22 1647 0     Pain Loc --      Pain Edu? --      Excl. in [place]? --     Most recent vital signs: Vitals:   04/13/22 1646  BP: (!) 176/83  Pulse: 97  Resp: 18  Temp: 98.9 F (37.2 C)  SpO2: 97%     General: Alert and in no acute distress. Head: No acute traumatic findings  Neck: No stridor.  Cardiovascular:  Good peripheral perfusion.  No appreciable murmurs, rubs, gallops Respiratory: Normal respiratory effort without tachypnea or retractions. Lungs with some mild rhonchi  bilateral lower lung fields.Kermit Balo air entry to the bases with no decreased or absent breath sounds. Musculoskeletal: Full range of motion to all extremities.  Neurologic:  No gross focal neurologic deficits are appreciated.  Cranial nerves grossly intact Skin:   No rash noted Other:   ED Results / Procedures / Treatments   Labs (all labs ordered are listed, but only abnormal results are displayed) Labs Reviewed  BASIC METABOLIC PANEL - Abnormal; Notable for the following components:      Result Value   Potassium 3.3 (*)    Glucose, Bld 112 (*)    All other components within normal limits  CBC - Abnormal; Notable for the following components:   MCV 103.2 (*)    MCH 34.3 (*)    All other components within normal limits  URINALYSIS, ROUTINE W REFLEX MICROSCOPIC - Abnormal; Notable for the following components:   Color, Urine AMBER (*)    APPearance CLEAR (*)    Leukocytes,Ua TRACE (*)    All other components within normal limits  CBG MONITORING, ED     EKG     RADIOLOGY  No results found.  PROCEDURES:  Critical Care performed: No  Procedures   MEDICATIONS ORDERED IN ED: Medications  albuterol (PROVENTIL) (2.5 MG/3ML) 0.083% nebulizer solution 2.5 mg (2.5 mg Nebulization Given 04/13/22 1833)     IMPRESSION / MDM / ASSESSMENT AND PLAN / ED COURSE  I reviewed the triage vital signs and the nursing notes.                              Differential diagnosis includes, but is not limited to, hypertension, hypotension  Patient's presentation is most consistent with acute presentation with potential threat to life or bodily function.   Patient's diagnosis is consistent with hypertension.  Patient presented to the emergency department complaining of periods of hypertension and hypotension.  Patient is currently being seen by cardiology, just completed a Holter monitor for same.  Patient stated that today he was out, did not feel well so he went to the fire  department to have his blood pressure checked.  It was elevated so EMS was called.  Reportedly patient has been having very labile pressures where it will be intermittently high, then low.  Patient went from hypertensive in the 170s to hypotensive with 09X systolic right back to hypertensive.  He is not having any headache, chest pain, other symptoms.  Basic labs were reassuring at this time.  Patient feels at his baseline.  He did request a dose of albuterol as he typically uses his inhalers for COPD.  He did have some rhonchi at this time.  No URI symptoms.  We had a long discussion regarding the patient's blood pressure.  Patient is encouraged to use daily logs of his blood pressure, follow-up with cardiology for results of his Holter monitor.  If patient has persistent significantly elevated hypertension, persistent hypotension or has concerning symptoms such as chest pain, shortness of breath he should return to the ED.  At this time no indication for further work-up, follow-up with cardiologist..  At this time as he does have labile blood pressure I will not change or add to his medications.  Again return precautions have been discussed at length with the patient.  Patient is given ED precautions to return to the ED for any worsening or new symptoms.        FINAL CLINICAL IMPRESSION(S) / ED DIAGNOSES   Final diagnoses:  Primary hypertension     Rx / DC Orders   ED Discharge Orders     None        Note:  This document was prepared using Dragon voice recognition software and may include unintentional dictation errors.   Darletta Moll, PA-C 04/13/22 1917    Naaman Plummer, MD 04/13/22 2330

## 2022-04-13 NOTE — ED Triage Notes (Addendum)
Pt comes with c/o low Bp. Pt states about 3 weeks his BP has been running high and spiking. Pt states he had called EMS but didn't get evaluated.   Pt felt that way today and had fire check it and it was high. Pt was sent here for referral. Pt states now EMS told him it was low.  Current BP-176/83  Pt states BP meds just started on and unsure if he took it today. Pt states dizziness, headache and increased weakness.

## 2022-04-13 NOTE — ED Notes (Signed)
Dc instructions reviewed with pt no questions or concerns at this time.  

## 2022-05-24 ENCOUNTER — Ambulatory Visit (INDEPENDENT_AMBULATORY_CARE_PROVIDER_SITE_OTHER): Payer: Medicaid Other | Admitting: Dermatology

## 2022-05-24 DIAGNOSIS — L720 Epidermal cyst: Secondary | ICD-10-CM | POA: Diagnosis not present

## 2022-05-24 NOTE — Progress Notes (Signed)
   New Patient Visit  Subjective  George Arellano is a 56 y.o. male who presents for the following: Cyst  (Of the L cheek - has been there for 5 years. Patient would like to discuss treatment options).  The following portions of the chart were reviewed this encounter and updated as appropriate:   Tobacco  Allergies  Meds  Problems  Med Hx  Surg Hx  Fam Hx     Review of Systems:  No other skin or systemic complaints except as noted in HPI or Assessment and Plan.  Objective  Well appearing patient in no apparent distress; mood and affect are within normal limits.  A focused examination was performed including the face. Relevant physical exam findings are noted in the Assessment and Plan.  L cheek inf nasal labial Subcutaneous nodule. 1.2 cm.      Assessment & Plan  Epidermal inclusion cyst L cheek inf nasal labial  Benign-appearing. Exam most consistent with an epidermal inclusion cyst. Discussed that a cyst is a benign growth that can grow over time and sometimes get irritated or inflamed. Recommend observation if it is not bothersome. Discussed option of surgical excision to remove it if it is growing, symptomatic, or other changes noted. Please call for new or changing lesions so they can be evaluated.  Return for surgery - cyst excision of the L cheek.  Luther Redo, CMA, am acting as scribe for Sarina Ser, MD . Documentation: I have reviewed the above documentation for accuracy and completeness, and I agree with the above.  Sarina Ser, MD

## 2022-05-24 NOTE — Patient Instructions (Signed)
 Pre-Operative Instructions  You are scheduled for a surgical procedure at [facility]. We recommend you read the following instructions. If you have any questions or concerns, please call the office at [phone].  Shower and wash the entire body with soap and water the day of your surgery paying special attention to cleansing at and around the planned surgery site.  Avoid aspirin or aspirin containing products at least fourteen (14) days prior to your surgical procedure and for at least one week (7 Days) after your surgical procedure. If you take aspirin on a regular basis for heart disease or history of stroke or for any other reason, we may recommend you continue taking aspirin but please notify us if you take this on a regular basis. Aspirin can cause more bleeding to occur during surgery as well as prolonged bleeding and bruising after surgery.   Avoid other nonsteroidal pain medications at least one week prior to surgery and at least one week prior to your surgery. These include medications such as Ibuprofen (Motrin, Advil and Nuprin), Naprosyn, Voltaren, Relafen, etc. If medications are used for therapeutic reasons, please inform us as they can cause increased bleeding or prolonged bleeding during and bruising after surgical procedures.   Please advise us if you are taking any "blood thinner" medications such as Coumadin or Dipyridamole or Plavix or similar medications. These cause increased bleeding and prolonged bleeding during procedures and bruising after surgical procedures. We may have to consider discontinuing these medications briefly prior to and shortly after your surgery if safe to do so.   Please inform us of all medications you are currently taking. All medications that are taken regularly should be taken the day of surgery as you always do. Nevertheless, we need to be informed of what medications you are taking prior to surgery to know whether they will affect the  procedure or cause any complications.   Please inform us of any medication allergies. Also inform us of whether you have allergies to Latex or rubber products or whether you have had any adverse reaction to Lidocaine or Epinephrine.  Please inform us of any prosthetic or artificial body parts such as artificial heart valve, joint replacements, etc., or similar condition that might require preoperative antibiotics.   We recommend avoidance of alcohol at least two weeks prior to surgery and continued avoidance for at least two weeks after surgery.   We recommend discontinuation of tobacco smoking at least two weeks prior to surgery and continued abstinence for at least two weeks after surgery.  Do not plan strenuous exercise, strenuous work or strenuous lifting for approximately four weeks after your surgery.   We request if you are unable to make your scheduled surgical appointment, please call us at least a week in advance or as soon as you are aware of a problem so that we can cancel or reschedule the appointment.   You MAY TAKE TYLENOL (acetaminophen) for pain as it is not a blood thinner.   PLEASE PLAN TO BE IN TOWN FOR TWO WEEKS FOLLOWING SURGERY, THIS IS IMPORTANT SO YOU CAN BE CHECKED FOR DRESSING CHANGES, SUTURE REMOVAL AND TO MONITOR FOR POSSIBLE COMPLICATIONS.    Due to recent changes in healthcare laws, you may see results of your pathology and/or laboratory studies on MyChart before the doctors have had a chance to review them. We understand that in some cases there may be results that are confusing or concerning to you. Please understand that not all results are   received at the same time and often the doctors may need to interpret multiple results in order to provide you with the best plan of care or course of treatment. Therefore, we ask that you please give us 2 business days to thoroughly review all your results before contacting the office for clarification. Should we see a  critical lab result, you will be contacted sooner.   If You Need Anything After Your Visit  If you have any questions or concerns for your doctor, please call our main line at [phone] and press option 4 to reach your doctor's medical assistant. If no one answers, please leave a voicemail as directed and we will return your call as soon as possible. Messages left after 4 pm will be answered the following business day.   You may also send us a message via MyChart. We typically respond to MyChart messages within 1-2 business days.  For prescription refills, please ask your pharmacy to contact our office. Our fax number is [phone].  If you have an urgent issue when the clinic is closed that cannot wait until the next business day, you can page your doctor at the number below.    Please note that while we do our best to be available for urgent issues outside of office hours, we are not available 24/7.   If you have an urgent issue and are unable to reach us, you may choose to seek medical care at your doctor's office, retail clinic, urgent care center, or emergency room.  If you have a medical emergency, please immediately call 911 or go to the emergency department.  Pager Numbers  - Dr. Kowalski: [phone]  - Dr. Moye: [phone]  - Dr. Stewart: [phone]  In the event of inclement weather, please call our main line at [phone] for an update on the status of any delays or closures.  Dermatology Medication Tips: Please keep the boxes that topical medications come in in order to help keep track of the instructions about where and how to use these. Pharmacies typically print the medication instructions only on the boxes and not directly on the medication tubes.   If your medication is too expensive, please contact our office at [phone] option 4 or send us a message through MyChart.   We are unable to tell what your co-pay for medications will be in advance as  this is different depending on your insurance coverage. However, we may be able to find a substitute medication at lower cost or fill out paperwork to get insurance to cover a needed medication.   If a prior authorization is required to get your medication covered by your insurance company, please allow us 1-2 business days to complete this process.  Drug prices often vary depending on where the prescription is filled and some pharmacies may offer cheaper prices.  The website www.goodrx.com contains coupons for medications through different pharmacies. The prices here do not account for what the cost may be with help from insurance (it may be cheaper with your insurance), but the website can give you the price if you did not use any insurance.  - You can print the associated coupon and take it with your prescription to the pharmacy.  - You may also stop by our office during regular business hours and pick up a GoodRx coupon card.  - If you need your prescription sent electronically to a different pharmacy, notify our office through [facility] MyChart or by phone at [phone] option 4.       Si Usted Necesita Algo Despus de Su Visita  Tambin puede enviarnos un mensaje a travs de MyChart. Por lo general respondemos a los mensajes de MyChart en el transcurso de 1 a 2 das hbiles.  Para renovar recetas, por favor pida a su farmacia que se ponga en contacto con nuestra oficina. Nuestro nmero de fax es el [phone].  Si tiene un asunto urgente cuando la clnica est cerrada y que no puede esperar hasta el siguiente da hbil, puede llamar/localizar a su doctor(a) al nmero que aparece a continuacin.   Por favor, tenga en cuenta que aunque hacemos todo lo posible para estar disponibles para asuntos urgentes fuera del horario de oficina, no estamos disponibles las 24 horas del da, los 7 das de la semana.   Si tiene un problema urgente y no puede comunicarse con nosotros, puede optar por  buscar atencin mdica  en el consultorio de su doctor(a), en una clnica privada, en un centro de atencin urgente o en una sala de emergencias.  Si tiene una emergencia mdica, por favor llame inmediatamente al 911 o vaya a la sala de emergencias.  Nmeros de bper  - Dr. Kowalski: [phone]  - Dra. Moye: [phone]  - Dra. Stewart: [phone]  En caso de inclemencias del tiempo, por favor llame a nuestra lnea principal al [phone] para una actualizacin sobre el estado de cualquier retraso o cierre.  Consejos para la medicacin en dermatologa: Por favor, guarde las cajas en las que vienen los medicamentos de uso tpico para ayudarle a seguir las instrucciones sobre dnde y cmo usarlos. Las farmacias generalmente imprimen las instrucciones del medicamento slo en las cajas y no directamente en los tubos del medicamento.   Si su medicamento es muy caro, por favor, pngase en contacto con nuestra oficina llamando al [phone] y presione la opcin 4 o envenos un mensaje a travs de MyChart.   No podemos decirle cul ser su copago por los medicamentos por adelantado ya que esto es diferente dependiendo de la cobertura de su seguro. Sin embargo, es posible que podamos encontrar un medicamento sustituto a menor costo o llenar un formulario para que el seguro cubra el medicamento que se considera necesario.   Si se requiere una autorizacin previa para que su compaa de seguros cubra su medicamento, por favor permtanos de 1 a 2 das hbiles para completar este proceso.  Los precios de los medicamentos varan con frecuencia dependiendo del lugar de dnde se surte la receta y alguna farmacias pueden ofrecer precios ms baratos.  El sitio web www.goodrx.com tiene cupones para medicamentos de diferentes farmacias. Los precios aqu no tienen en cuenta lo que podra costar con la ayuda del seguro (puede ser ms barato con su seguro), pero el sitio web puede darle el precio si no  utiliz ningn seguro.  - Puede imprimir el cupn correspondiente y llevarlo con su receta a la farmacia.  - Tambin puede pasar por nuestra oficina durante el horario de atencin regular y recoger una tarjeta de cupones de GoodRx.  - Si necesita que su receta se enve electrnicamente a una farmacia diferente, informe a nuestra oficina a travs de MyChart de [place] o por telfono llamando al [phone] y presione la opcin 4.  

## 2022-05-29 ENCOUNTER — Encounter: Payer: Self-pay | Admitting: Dermatology

## 2022-06-22 ENCOUNTER — Ambulatory Visit: Payer: Medicaid Other | Admitting: Dermatology

## 2022-06-22 ENCOUNTER — Telehealth: Payer: Self-pay

## 2022-06-22 DIAGNOSIS — D485 Neoplasm of uncertain behavior of skin: Secondary | ICD-10-CM

## 2022-06-22 DIAGNOSIS — L720 Epidermal cyst: Secondary | ICD-10-CM | POA: Diagnosis not present

## 2022-06-22 MED ORDER — MUPIROCIN 2 % EX OINT: 1.0000 | TOPICAL_OINTMENT | Freq: Every day | CUTANEOUS | 0 refills | Status: DC

## 2022-06-22 NOTE — Telephone Encounter (Signed)
Spoke with patient regarding surgery. He is doing fine/hd  

## 2022-06-22 NOTE — Progress Notes (Signed)
   Follow-Up Visit   Subjective  Duilio Heritage is a 56 y.o. male who presents for the following: Cyst (Left cheek inf nasolabial - excise today).  The following portions of the chart were reviewed this encounter and updated as appropriate:   Tobacco  Allergies  Meds  Problems  Med Hx  Surg Hx  Fam Hx     Review of Systems:  No other skin or systemic complaints except as noted in HPI or Assessment and Plan.  Objective  Well appearing patient in no apparent distress; mood and affect are within normal limits.  A focused examination was performed including face. Relevant physical exam findings are noted in the Assessment and Plan.  Left cheek inf nasolabial Cystic papule   Assessment & Plan  Neoplasm of uncertain behavior of skin Left cheek inf nasolabial  Skin excision  Lesion length (cm):  1.2 Lesion width (cm):  1.2 Margin per side (cm):  0 Total excision diameter (cm):  1.2 Informed consent: discussed and consent obtained   Timeout: patient name, date of birth, surgical site, and procedure verified   Procedure prep:  Patient was prepped and draped in usual sterile fashion Prep type:  Isopropyl alcohol and povidone-iodine Anesthesia: the lesion was anesthetized in a standard fashion   Anesthetic:  1% lidocaine w/ epinephrine 1-100,000 buffered w/ 8.4% NaHCO3 Instrument used: #15 blade   Hemostasis achieved with: pressure   Hemostasis achieved with comment:  Electrocautery Outcome: patient tolerated procedure well with no complications   Post-procedure details: sterile dressing applied and wound care instructions given   Dressing type: bandage and pressure dressing (mupirocin)    Skin repair Complexity:  Complex Final length (cm):  3 Reason for type of repair: reduce tension to allow closure, reduce the risk of dehiscence, infection, and necrosis, reduce subcutaneous dead space and avoid a hematoma, allow closure of the large defect, preserve normal anatomy, preserve  normal anatomical and functional relationships and enhance both functionality and cosmetic results   Undermining: area extensively undermined   Undermining comment:  Undermining defect 1.3 cm Subcutaneous layers (deep stitches):  Suture size:  5-0 Suture type: Vicryl (polyglactin 910)   Subcutaneous suture technique: inverted dermal. Fine/surface layer approximation (top stitches):  Suture size:  5-0 Suture type: nylon   Stitches: simple running   Suture removal (days):  7 Hemostasis achieved with: suture and pressure Outcome: patient tolerated procedure well with no complications   Post-procedure details: sterile dressing applied and wound care instructions given   Dressing type: bandage and pressure dressing (mupirocin)    mupirocin ointment (BACTROBAN) 2 % Apply 1 Application topically daily. With dressing changes  Specimen 1 - Surgical pathology Differential Diagnosis: Cyst vs other  Check Margins: No   Return for suture removal.  I, Ashok Cordia, CMA, am acting as scribe for Sarina Ser, MD . Documentation: I have reviewed the above documentation for accuracy and completeness, and I agree with the above.  Sarina Ser, MD

## 2022-06-22 NOTE — Patient Instructions (Signed)

## 2022-06-29 ENCOUNTER — Ambulatory Visit (INDEPENDENT_AMBULATORY_CARE_PROVIDER_SITE_OTHER): Payer: Medicaid Other | Admitting: Dermatology

## 2022-06-29 DIAGNOSIS — Z4802 Encounter for removal of sutures: Secondary | ICD-10-CM

## 2022-06-29 DIAGNOSIS — L720 Epidermal cyst: Secondary | ICD-10-CM

## 2022-06-29 NOTE — Progress Notes (Signed)
   Follow-Up Visit   Subjective  George Arellano is a 56 y.o. male who presents for the following: Suture removal (Pathology proven benign cyst of the L cheek inf nasolabial - patient is here today for suture removal).  The following portions of the chart were reviewed this encounter and updated as appropriate:   Tobacco  Allergies  Meds  Problems  Med Hx  Surg Hx  Fam Hx     Review of Systems:  No other skin or systemic complaints except as noted in HPI or Assessment and Plan.  Objective  Well appearing patient in no apparent distress; mood and affect are within normal limits.  A focused examination was performed including the face. Relevant physical exam findings are noted in the Assessment and Plan.  L cheek inf nasolabial Healing excision site.    Assessment & Plan  Epidermal inclusion cyst L cheek inf nasolabial  Encounter for Removal of Sutures - Incision site at the L cheek inf nasolabial is clean, dry and intact - Wound cleansed, sutures removed, wound cleansed and steri strips applied.  - Discussed pathology results showing a benign cyst  - Patient advised to keep steri-strips dry until they fall off. - Scars remodel for a full year. - Once steri-strips fall off, patient can apply over-the-counter silicone scar cream each night to help with scar remodeling if desired. - Patient advised to call with any concerns or if they notice any new or changing lesions.  Return if symptoms worsen or fail to improve.  Luther Redo, CMA, am acting as scribe for Sarina Ser, MD . Documentation: I have reviewed the above documentation for accuracy and completeness, and I agree with the above.  Sarina Ser, MD

## 2022-06-29 NOTE — Patient Instructions (Signed)
Due to recent changes in healthcare laws, you may see results of your pathology and/or laboratory studies on MyChart before the doctors have had a chance to review them. We understand that in some cases there may be results that are confusing or concerning to you. Please understand that not all results are received at the same time and often the doctors may need to interpret multiple results in order to provide you with the best plan of care or course of treatment. Therefore, we ask that you please give us 2 business days to thoroughly review all your results before contacting the office for clarification. Should we see a critical lab result, you will be contacted sooner.   If You Need Anything After Your Visit  If you have any questions or concerns for your doctor, please call our main line at [phone] and press option 4 to reach your doctor's medical assistant. If no one answers, please leave a voicemail as directed and we will return your call as soon as possible. Messages left after 4 pm will be answered the following business day.   You may also send us a message via MyChart. We typically respond to MyChart messages within 1-2 business days.  For prescription refills, please ask your pharmacy to contact our office. Our fax number is [phone].  If you have an urgent issue when the clinic is closed that cannot wait until the next business day, you can page your doctor at the number below.    Please note that while we do our best to be available for urgent issues outside of office hours, we are not available 24/7.   If you have an urgent issue and are unable to reach us, you may choose to seek medical care at your doctor's office, retail clinic, urgent care center, or emergency room.  If you have a medical emergency, please immediately call 911 or go to the emergency department.  Pager Numbers  - Dr. Kowalski: [phone]  - Dr. Moye: [phone]  - Dr. Stewart:  [phone]  In the event of inclement weather, please call our main line at [phone] for an update on the status of any delays or closures.  Dermatology Medication Tips: Please keep the boxes that topical medications come in in order to help keep track of the instructions about where and how to use these. Pharmacies typically print the medication instructions only on the boxes and not directly on the medication tubes.   If your medication is too expensive, please contact our office at [phone] option 4 or send us a message through MyChart.   We are unable to tell what your co-pay for medications will be in advance as this is different depending on your insurance coverage. However, we may be able to find a substitute medication at lower cost or fill out paperwork to get insurance to cover a needed medication.   If a prior authorization is required to get your medication covered by your insurance company, please allow us 1-2 business days to complete this process.  Drug prices often vary depending on where the prescription is filled and some pharmacies may offer cheaper prices.  The website www.goodrx.com contains coupons for medications through different pharmacies. The prices here do not account for what the cost may be with help from insurance (it may be cheaper with your insurance), but the website can give you the price if you did not use any insurance.  - You can print the associated coupon and take it with   your prescription to the pharmacy.  - You may also stop by our office during regular business hours and pick up a GoodRx coupon card.  - If you need your prescription sent electronically to a different pharmacy, notify our office through [facility] MyChart or by phone at [phone] option 4.     Si Usted Necesita Algo Despus de Su Visita  Tambin puede enviarnos un mensaje a travs de MyChart. Por lo general respondemos a los mensajes de MyChart en el transcurso de 1 a 2  das hbiles.  Para renovar recetas, por favor pida a su farmacia que se ponga en contacto con nuestra oficina. Nuestro nmero de fax es el [phone].  Si tiene un asunto urgente cuando la clnica est cerrada y que no puede esperar hasta el siguiente da hbil, puede llamar/localizar a su doctor(a) al nmero que aparece a continuacin.   Por favor, tenga en cuenta que aunque hacemos todo lo posible para estar disponibles para asuntos urgentes fuera del horario de oficina, no estamos disponibles las 24 horas del da, los 7 das de la semana.   Si tiene un problema urgente y no puede comunicarse con nosotros, puede optar por buscar atencin mdica  en el consultorio de su doctor(a), en una clnica privada, en un centro de atencin urgente o en una sala de emergencias.  Si tiene una emergencia mdica, por favor llame inmediatamente al 911 o vaya a la sala de emergencias.  Nmeros de bper  - Dr. Kowalski: [phone]  - Dra. Moye: [phone]  - Dra. Stewart: [phone]  En caso de inclemencias del tiempo, por favor llame a nuestra lnea principal al [phone] para una actualizacin sobre el estado de cualquier retraso o cierre.  Consejos para la medicacin en dermatologa: Por favor, guarde las cajas en las que vienen los medicamentos de uso tpico para ayudarle a seguir las instrucciones sobre dnde y cmo usarlos. Las farmacias generalmente imprimen las instrucciones del medicamento slo en las cajas y no directamente en los tubos del medicamento.   Si su medicamento es muy caro, por favor, pngase en contacto con nuestra oficina llamando al [phone] y presione la opcin 4 o envenos un mensaje a travs de MyChart.   No podemos decirle cul ser su copago por los medicamentos por adelantado ya que esto es diferente dependiendo de la cobertura de su seguro. Sin embargo, es posible que podamos encontrar un medicamento sustituto a menor costo o llenar un formulario para que el  seguro cubra el medicamento que se considera necesario.   Si se requiere una autorizacin previa para que su compaa de seguros cubra su medicamento, por favor permtanos de 1 a 2 das hbiles para completar este proceso.  Los precios de los medicamentos varan con frecuencia dependiendo del lugar de dnde se surte la receta y alguna farmacias pueden ofrecer precios ms baratos.  El sitio web www.goodrx.com tiene cupones para medicamentos de diferentes farmacias. Los precios aqu no tienen en cuenta lo que podra costar con la ayuda del seguro (puede ser ms barato con su seguro), pero el sitio web puede darle el precio si no utiliz ningn seguro.  - Puede imprimir el cupn correspondiente y llevarlo con su receta a la farmacia.  - Tambin puede pasar por nuestra oficina durante el horario de atencin regular y recoger una tarjeta de cupones de GoodRx.  - Si necesita que su receta se enve electrnicamente a una farmacia diferente, informe a nuestra oficina a travs de MyChart de [place]   o por telfono llamando al [phone] y presione la opcin 4.  

## 2022-07-02 ENCOUNTER — Encounter: Payer: Self-pay | Admitting: Dermatology

## 2022-07-09 ENCOUNTER — Encounter: Payer: Self-pay | Admitting: Dermatology

## 2022-08-31 ENCOUNTER — Observation Stay: Payer: Medicaid Other

## 2022-08-31 ENCOUNTER — Observation Stay
Admission: EM | Admit: 2022-08-31 | Discharge: 2022-09-01 | Disposition: A | Discharge status: Home / Self Care | Payer: Medicaid Other | Attending: Internal Medicine | Admitting: Internal Medicine

## 2022-08-31 ENCOUNTER — Emergency Department: Payer: Medicaid Other

## 2022-08-31 ENCOUNTER — Encounter: Payer: Self-pay | Admitting: Emergency Medicine

## 2022-08-31 ENCOUNTER — Other Ambulatory Visit: Payer: Self-pay

## 2022-08-31 DIAGNOSIS — J441 Chronic obstructive pulmonary disease with (acute) exacerbation: Secondary | ICD-10-CM | POA: Diagnosis not present

## 2022-08-31 DIAGNOSIS — E871 Hypo-osmolality and hyponatremia: Secondary | ICD-10-CM | POA: Diagnosis not present

## 2022-08-31 DIAGNOSIS — R0602 Shortness of breath: Secondary | ICD-10-CM | POA: Diagnosis present

## 2022-08-31 DIAGNOSIS — Z8673 Personal history of transient ischemic attack (TIA), and cerebral infarction without residual deficits: Secondary | ICD-10-CM | POA: Insufficient documentation

## 2022-08-31 DIAGNOSIS — Z7982 Long term (current) use of aspirin: Secondary | ICD-10-CM | POA: Insufficient documentation

## 2022-08-31 DIAGNOSIS — Z79899 Other long term (current) drug therapy: Secondary | ICD-10-CM | POA: Diagnosis not present

## 2022-08-31 DIAGNOSIS — Z1152 Encounter for screening for COVID-19: Secondary | ICD-10-CM | POA: Insufficient documentation

## 2022-08-31 DIAGNOSIS — I69359 Hemiplegia and hemiparesis following cerebral infarction affecting unspecified side: Secondary | ICD-10-CM

## 2022-08-31 DIAGNOSIS — I4891 Unspecified atrial fibrillation: Secondary | ICD-10-CM | POA: Insufficient documentation

## 2022-08-31 DIAGNOSIS — F1090 Alcohol use, unspecified, uncomplicated: Secondary | ICD-10-CM | POA: Diagnosis present

## 2022-08-31 DIAGNOSIS — M7989 Other specified soft tissue disorders: Secondary | ICD-10-CM

## 2022-08-31 DIAGNOSIS — I499 Cardiac arrhythmia, unspecified: Secondary | ICD-10-CM | POA: Insufficient documentation

## 2022-08-31 DIAGNOSIS — I110 Hypertensive heart disease with heart failure: Secondary | ICD-10-CM | POA: Diagnosis not present

## 2022-08-31 DIAGNOSIS — I509 Heart failure, unspecified: Secondary | ICD-10-CM | POA: Insufficient documentation

## 2022-08-31 DIAGNOSIS — F17210 Nicotine dependence, cigarettes, uncomplicated: Secondary | ICD-10-CM | POA: Insufficient documentation

## 2022-08-31 DIAGNOSIS — R2243 Localized swelling, mass and lump, lower limb, bilateral: Secondary | ICD-10-CM | POA: Insufficient documentation

## 2022-08-31 DIAGNOSIS — G40909 Epilepsy, unspecified, not intractable, without status epilepticus: Secondary | ICD-10-CM

## 2022-08-31 DIAGNOSIS — R7401 Elevation of levels of liver transaminase levels: Secondary | ICD-10-CM | POA: Diagnosis not present

## 2022-08-31 DIAGNOSIS — I10 Essential (primary) hypertension: Secondary | ICD-10-CM | POA: Insufficient documentation

## 2022-08-31 DIAGNOSIS — R600 Localized edema: Secondary | ICD-10-CM | POA: Insufficient documentation

## 2022-08-31 LAB — BLOOD GAS, VENOUS
Acid-Base Excess: 8.6 mmol/L — ABNORMAL HIGH (ref 0.0–2.0)
Bicarbonate: 33.5 mmol/L — ABNORMAL HIGH (ref 20.0–28.0)
O2 Saturation: 98.6 %
Patient temperature: 37
pCO2, Ven: 46 mmHg (ref 44–60)
pH, Ven: 7.47 — ABNORMAL HIGH (ref 7.25–7.43)
pO2, Ven: 88 mmHg — ABNORMAL HIGH (ref 32–45)

## 2022-08-31 LAB — CBC
HCT: 46.6 % (ref 39.0–52.0)
Hemoglobin: 16.2 g/dL (ref 13.0–17.0)
MCH: 34.5 pg — ABNORMAL HIGH (ref 26.0–34.0)
MCHC: 34.8 g/dL (ref 30.0–36.0)
MCV: 99.1 fL (ref 80.0–100.0)
Platelets: 157 10*3/uL (ref 150–400)
RBC: 4.7 MIL/uL (ref 4.22–5.81)
RDW: 12.6 % (ref 11.5–15.5)
WBC: 6.3 10*3/uL (ref 4.0–10.5)
nRBC: 0 % (ref 0.0–0.2)

## 2022-08-31 LAB — HEPATIC FUNCTION PANEL
ALT: 46 U/L — ABNORMAL HIGH (ref 0–44)
AST: 58 U/L — ABNORMAL HIGH (ref 15–41)
Albumin: 3.6 g/dL (ref 3.5–5.0)
Alkaline Phosphatase: 126 U/L (ref 38–126)
Bilirubin, Direct: 0.5 mg/dL — ABNORMAL HIGH (ref 0.0–0.2)
Indirect Bilirubin: 1 mg/dL — ABNORMAL HIGH (ref 0.3–0.9)
Total Bilirubin: 1.5 mg/dL — ABNORMAL HIGH (ref 0.3–1.2)
Total Protein: 7.3 g/dL (ref 6.5–8.1)

## 2022-08-31 LAB — RESP PANEL BY RT-PCR (RSV, FLU A&B, COVID)  RVPGX2
Influenza A by PCR: NEGATIVE
Influenza B by PCR: NEGATIVE
Resp Syncytial Virus by PCR: NEGATIVE
SARS Coronavirus 2 by RT PCR: NEGATIVE

## 2022-08-31 LAB — BASIC METABOLIC PANEL
Anion gap: 13 (ref 5–15)
BUN: 8 mg/dL (ref 6–20)
CO2: 27 mmol/L (ref 22–32)
Calcium: 9.8 mg/dL (ref 8.9–10.3)
Chloride: 92 mmol/L — ABNORMAL LOW (ref 98–111)
Creatinine, Ser: 0.79 mg/dL (ref 0.61–1.24)
GFR, Estimated: >60 mL/min (ref >60)
Glucose, Bld: 113 mg/dL — ABNORMAL HIGH (ref 70–99)
Potassium: 3.6 mmol/L (ref 3.5–5.1)
Sodium: 132 mmol/L — ABNORMAL LOW (ref 135–145)

## 2022-08-31 LAB — TROPONIN I (HIGH SENSITIVITY)
Troponin I (High Sensitivity): 8 ng/L (ref <18)
Troponin I (High Sensitivity): 8 ng/L (ref <18)

## 2022-08-31 LAB — BRAIN NATRIURETIC PEPTIDE: B Natriuretic Peptide: 38.5 pg/mL (ref 0.0–100.0)

## 2022-08-31 MED ORDER — THIAMINE MONONITRATE 100 MG PO TABS
100.0000 mg | ORAL_TABLET | Freq: Every day | ORAL | Status: DC
  Administered 2022-09-01: 100 mg via ORAL
  Filled 2022-08-31: qty 1

## 2022-08-31 MED ORDER — IPRATROPIUM-ALBUTEROL 0.5-2.5 (3) MG/3ML IN SOLN
3.0000 mL | Freq: Once | RESPIRATORY_TRACT | Status: AC
  Administered 2022-08-31: 3 mL via RESPIRATORY_TRACT
  Filled 2022-08-31: qty 3

## 2022-08-31 MED ORDER — LORAZEPAM 2 MG/ML IJ SOLN: 1.0000 mg | INTRAMUSCULAR | Status: DC | PRN

## 2022-08-31 MED ORDER — ALBUTEROL SULFATE (2.5 MG/3ML) 0.083% IN NEBU: 2.5000 mg | INHALATION_SOLUTION | RESPIRATORY_TRACT | Status: DC | PRN

## 2022-08-31 MED ORDER — HYDROCODONE-ACETAMINOPHEN 5-325 MG PO TABS: 1.0000 | ORAL_TABLET | ORAL | Status: DC | PRN

## 2022-08-31 MED ORDER — GABAPENTIN 300 MG PO CAPS
300.0000 mg | ORAL_CAPSULE | Freq: Two times a day (BID) | ORAL | Status: DC
  Administered 2022-09-01 (×2): 300 mg via ORAL
  Filled 2022-08-31 (×2): qty 1

## 2022-08-31 MED ORDER — ADULT MULTIVITAMIN W/MINERALS CH: 1.0000 | ORAL_TABLET | Freq: Every day | ORAL | Status: DC

## 2022-08-31 MED ORDER — IPRATROPIUM-ALBUTEROL 0.5-2.5 (3) MG/3ML IN SOLN
3.0000 mL | Freq: Four times a day (QID) | RESPIRATORY_TRACT | Status: DC
  Administered 2022-09-01 (×2): 3 mL via RESPIRATORY_TRACT
  Filled 2022-08-31 (×2): qty 3

## 2022-08-31 MED ORDER — LAMOTRIGINE 100 MG PO TABS
150.0000 mg | ORAL_TABLET | Freq: Every day | ORAL | Status: DC
  Administered 2022-09-01: 150 mg via ORAL
  Filled 2022-08-31: qty 2

## 2022-08-31 MED ORDER — VITAMIN D (ERGOCALCIFEROL) 1.25 MG (50000 UNIT) PO CAPS: 50000.0000 [IU] | ORAL_CAPSULE | ORAL | Status: DC

## 2022-08-31 MED ORDER — GEMFIBROZIL 600 MG PO TABS: 600.0000 mg | ORAL_TABLET | Freq: Two times a day (BID) | ORAL | Status: DC

## 2022-08-31 MED ORDER — ONDANSETRON HCL 4 MG/2ML IJ SOLN: 4.0000 mg | Freq: Four times a day (QID) | INTRAMUSCULAR | Status: DC | PRN

## 2022-08-31 MED ORDER — BUSPIRONE HCL 10 MG PO TABS
10.0000 mg | ORAL_TABLET | Freq: Three times a day (TID) | ORAL | Status: DC
  Administered 2022-09-01 (×2): 10 mg via ORAL
  Filled 2022-08-31: qty 1
  Filled 2022-08-31: qty 2

## 2022-08-31 MED ORDER — ADULT MULTIVITAMIN W/MINERALS CH
1.0000 | ORAL_TABLET | Freq: Every day | ORAL | Status: DC
  Administered 2022-09-01: 1 via ORAL
  Filled 2022-08-31: qty 1

## 2022-08-31 MED ORDER — METOPROLOL TARTRATE 50 MG PO TABS
50.0000 mg | ORAL_TABLET | Freq: Two times a day (BID) | ORAL | Status: DC
  Administered 2022-09-01 (×2): 50 mg via ORAL
  Filled 2022-08-31 (×2): qty 1

## 2022-08-31 MED ORDER — ENOXAPARIN SODIUM 40 MG/0.4ML IJ SOSY
40.0000 mg | PREFILLED_SYRINGE | INTRAMUSCULAR | Status: DC
  Administered 2022-09-01: 40 mg via SUBCUTANEOUS
  Filled 2022-08-31: qty 0.4

## 2022-08-31 MED ORDER — ONDANSETRON HCL 4 MG PO TABS: 4.0000 mg | ORAL_TABLET | Freq: Four times a day (QID) | ORAL | Status: DC | PRN

## 2022-08-31 MED ORDER — PREDNISONE 20 MG PO TABS: 40.0000 mg | ORAL_TABLET | Freq: Every day | ORAL | Status: DC

## 2022-08-31 MED ORDER — ASPIRIN 81 MG PO TBEC
81.0000 mg | DELAYED_RELEASE_TABLET | Freq: Every day | ORAL | Status: DC
  Administered 2022-09-01: 81 mg via ORAL
  Filled 2022-08-31: qty 1

## 2022-08-31 MED ORDER — HYDROCHLOROTHIAZIDE 12.5 MG PO TABS
12.5000 mg | ORAL_TABLET | Freq: Every day | ORAL | Status: DC
  Administered 2022-09-01: 12.5 mg via ORAL
  Filled 2022-08-31: qty 1

## 2022-08-31 MED ORDER — LORAZEPAM 1 MG PO TABS: 1.0000 mg | ORAL_TABLET | ORAL | Status: DC | PRN

## 2022-08-31 MED ORDER — LAMOTRIGINE 100 MG PO TABS
200.0000 mg | ORAL_TABLET | Freq: Every day | ORAL | Status: DC
  Administered 2022-09-01: 200 mg via ORAL
  Filled 2022-08-31: qty 2

## 2022-08-31 MED ORDER — ACETAMINOPHEN 650 MG RE SUPP: 650.0000 mg | Freq: Four times a day (QID) | RECTAL | Status: DC | PRN

## 2022-08-31 MED ORDER — B COMPLEX-C PO TABS
1.0000 | ORAL_TABLET | Freq: Every day | ORAL | Status: DC
  Filled 2022-08-31 (×2): qty 1

## 2022-08-31 MED ORDER — POTASSIUM CHLORIDE CRYS ER 10 MEQ PO TBCR
10.0000 meq | EXTENDED_RELEASE_TABLET | Freq: Two times a day (BID) | ORAL | Status: DC
  Administered 2022-09-01 (×2): 10 meq via ORAL
  Filled 2022-08-31 (×2): qty 1

## 2022-08-31 MED ORDER — ACETAMINOPHEN 325 MG PO TABS: 650.0000 mg | ORAL_TABLET | Freq: Four times a day (QID) | ORAL | Status: DC | PRN

## 2022-08-31 MED ORDER — FOLIC ACID 1 MG PO TABS
1.0000 mg | ORAL_TABLET | Freq: Every day | ORAL | Status: DC
  Administered 2022-09-01: 1 mg via ORAL
  Filled 2022-08-31: qty 1

## 2022-08-31 MED ORDER — METHYLPREDNISOLONE SODIUM SUCC 40 MG IJ SOLR
40.0000 mg | Freq: Two times a day (BID) | INTRAMUSCULAR | Status: AC
  Administered 2022-09-01 (×2): 40 mg via INTRAVENOUS
  Filled 2022-08-31 (×2): qty 1

## 2022-08-31 MED ORDER — LOSARTAN POTASSIUM 25 MG PO TABS
25.0000 mg | ORAL_TABLET | Freq: Every day | ORAL | Status: DC
  Administered 2022-09-01: 25 mg via ORAL
  Filled 2022-08-31: qty 1

## 2022-08-31 MED ORDER — AMLODIPINE BESYLATE 5 MG PO TABS: 10.0000 mg | ORAL_TABLET | Freq: Every day | ORAL | Status: DC

## 2022-08-31 MED ORDER — THIAMINE HCL 100 MG/ML IJ SOLN: 100.0000 mg | Freq: Every day | INTRAMUSCULAR | Status: DC

## 2022-08-31 NOTE — Assessment & Plan Note (Signed)
Scheduled and as needed nebulized bronchodilator treatments IV steroids Antitussives Supplemental oxygen as needed

## 2022-08-31 NOTE — Assessment & Plan Note (Signed)
Drinks a 6-pack of beer daily. 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

## 2022-08-31 NOTE — ED Triage Notes (Addendum)
Pt arrived via [facility] from home, hx of COPD, sob this morning, no improvement with inhaler at home, pt has audible wheezing, received 2 DUONEBS and 174m Solumedrol with EMS.  Pt was 93% on RA with EMS, placed on 4L [place], pt continues to have expiratory wheezing after duoneb, but EMS reports improvement in breathing since receiving solumedrol as well.   Pt also reports swelling to both feet x 2 weeks. Pt denies hx of HF.  Pt has hx of afib on aspirin.

## 2022-08-31 NOTE — ED Provider Notes (Signed)
[facility] Provider Note    Event Date/Time   First MD Initiated Contact with Patient 08/31/22 2020     (approximate)   History   Shortness of Breath   HPI  Makson Fusillo is a 57 y.o. male with COPD who comes in with concerns for swelling and shortness of breath.  He reports having history of COPD and uses 2 L at nighttime however he noticed this morning that he was more short of breath than normal.  He does report having some swelling in his bilateral legs and telling his doctor about it for the past few weeks and they started him on hydrochlorothiazide.  She denies any known history of heart failure or history of blood clots.  Denies any abdominal pain, falls and hitting his head or any other concerns     Physical Exam   Triage Vital Signs: ED Triage Vitals  Enc Vitals Group     BP 08/31/22 2018 (!) 140/101     Pulse Rate 08/31/22 2018 92     Resp 08/31/22 2018 (!) 22     Temp 08/31/22 2018 98.4 F (36.9 C)     Temp Source 08/31/22 2018 Oral     SpO2 08/31/22 2018 96 %     Weight 08/31/22 2014 195 lb (88.5 kg)     Height 08/31/22 2014 5' 8"$  (1.727 m)     Head Circumference --      Peak Flow --      Pain Score 08/31/22 2014 0     Pain Loc --      Pain Edu? --      Excl. in [place]? --     Most recent vital signs: Vitals:   08/31/22 2018  BP: (!) 140/101  Pulse: 92  Resp: (!) 22  Temp: 98.4 F (36.9 C)  SpO2: 96%     General: Awake, no distress.  CV:  Good peripheral perfusion.  Resp:  Normal effort.  Audible wheezing noted. Abd:  No distention.  Other:  1+ edema bilaterally.   ED Results / Procedures / Treatments   Labs (all labs ordered are listed, but only abnormal results are displayed) Labs Reviewed  RESP PANEL BY RT-PCR (RSV, FLU A&B, COVID)  [id number]  BASIC METABOLIC PANEL  CBC  BRAIN NATRIURETIC PEPTIDE  BLOOD GAS, VENOUS  HEPATIC FUNCTION PANEL  TROPONIN I (HIGH SENSITIVITY)     EKG  My interpretation of  EKG:  Normal sinus rate of 89 without any ST elevation or T wave inversions except for aVL, normal intervals  RADIOLOGY I have reviewed the xray personally and interpreted and no evidence of pneumonia  PROCEDURES:  Critical Care performed: No  .1-3 Lead EKG Interpretation  Performed by: Vanessa [place], MD Authorized by: Vanessa [place], MD     Interpretation: normal     ECG rate:  90   ECG rate assessment: normal     Rhythm: sinus rhythm     Ectopy: none     Conduction: normal      MEDICATIONS ORDERED IN ED: Medications  ipratropium-albuterol (DUONEB) 0.5-2.5 (3) MG/3ML nebulizer solution 3 mL (has no administration in time range)     IMPRESSION / MDM / ASSESSMENT AND PLAN / ED COURSE  I reviewed the triage vital signs and the nursing notes.   Patient's presentation is most consistent with acute presentation with potential threat to life or bodily function.   Patient comes in with shortness of breath that started  today.  Seems most consistent with COPD exacerbation does have some swelling in his legs could be CHF as well.  Labs were ordered to further evaluate.  No swelling in 1 leg to suggest PE no prior history of PE.  Will test for COVID, flu.  BMP slightly low sodium and chloride BNP normal CBC reassuring troponin is negative  Reassessment after the breathing treatment patient actually looks much more comfortable.  Do not feel he will probably need BiPAP but the VBG was delayed I will admit to the hospitalist for the shortness of breath.  The patient is on the cardiac monitor to evaluate for evidence of arrhythmia and/or significant heart rate changes.      FINAL CLINICAL IMPRESSION(S) / ED DIAGNOSES   Final diagnoses:  COPD exacerbation ([place])  Leg swelling     Rx / DC Orders   ED Discharge Orders     None        Note:  This document was prepared using Dragon voice recognition software and may include unintentional dictation errors.   Vanessa Hart,  MD 08/31/22 [phone]

## 2022-08-31 NOTE — Assessment & Plan Note (Signed)
Continue metoprolol, losartan, HCTZ, amlodipine

## 2022-08-31 NOTE — Assessment & Plan Note (Signed)
Possibly related to alcohol use, however patient has a history of chronic cholecystitis Acute hepatitis panel Right upper quadrant ultrasound--> hepatic steatosis, prior cholecystectomy

## 2022-08-31 NOTE — Assessment & Plan Note (Signed)
Continue aspirin and gemfibrozil

## 2022-08-31 NOTE — Assessment & Plan Note (Signed)
Continue lamotrigine and gabapentin

## 2022-08-31 NOTE — H&P (Incomplete)
History and Physical    Patient: George Arellano [id number] DOB: Jan 22, 1966 DOA: 08/31/2022 DOS: the patient was seen and examined on 08/31/2022 PCP: System, Provider Not In  Patient coming from: Home  Chief Complaint:  Chief Complaint  Patient presents with   Shortness of Breath    HPI: George Arellano is a 57 y.o. male with medical history significant for COPD previously on nighttime O2 at 2 L, alcohol use disorder, tobacco use disorder, HTN, prior stroke who presents to the ED with shortness of breath and wheezing unresolved with home bronchodilators.  He denied chest pain, fever or chills.  He does have bilateral lower extremity edema without pain.  .Lab for he arrive by EMS who recorded an O2 sat of 93% on room air and was placed on 4 L for transport.  He was given 2 DuoNebs and Solu-Medrol and route. ED course and data review: BP 140/101 on arrival tachypneic to 22 and O2 sat 96% on 2 L.  VBG showed pH 7.47 and pCO2 of 46.  Respiratory viral panel negative.  Troponin 8 and BNP 38.5.Marland Kitchen    CBC and BMP unremarkable except for sodium of 132.  EKG, personally viewed and interpreted showing NSR at 89 with nonspecific ST-T wave changes.  Chest x-ray showed no active disease Lower extremity venous Doppler ordered results pending at the time of admission. Patient given an additional DuoNeb in the ED. Hospitalization requested due to continued wheezing and increased work of breathing.   Review of Systems: As mentioned in the history of present illness. All other systems reviewed and are negative.  Past Medical History:  Diagnosis Date   Acute cholecystitis with chronic cholecystitis    Alcohol abuse    Anxiety    CHF (congestive heart failure) (HCC)    COPD (chronic obstructive pulmonary disease) (HCC)    Depression    Dyspnea    Dysrhythmia    A Fib   Hypercholesterolemia    Hypertension    Neuromuscular disorder (HCC)    Seizures ([place]) 12/2018   Stroke ([place]) 07/2017   balance issues,  left sided deficit   Umbilical hernia without obstruction and without gangrene    Wears dentures    full upper and lower   Past Surgical History:  Procedure Laterality Date   CATARACT EXTRACTION W/PHACO Left 08/09/2019   Procedure: CATARACT EXTRACTION PHACO AND INTRAOCULAR LENS PLACEMENT (IOC) LEFT 3.75  00:40.6  9.2%;  Surgeon: Marchia Meiers, MD;  Location: [facility];  Service: Ophthalmology;  Laterality: Left;   CATARACT EXTRACTION W/PHACO Right 06/10/2020   Procedure: CATARACT EXTRACTION PHACO AND INTRAOCULAR LENS PLACEMENT ([place]) RIGHT;  Surgeon: Birder Robson, MD;  Location: [facility];  Service: Ophthalmology;  Laterality: Right;  2.62 0:31.5   CHOLECYSTECTOMY N/A 08/28/2018   Procedure: LAPAROSCOPIC CHOLECYSTECTOMY umbilical hernia repair;  Surgeon: Vickie Epley, MD;  Location: [facility];  Service: General;  Laterality: N/A;   LOOP RECORDER INSERTION N/A 09/15/2017   Procedure: LOOP RECORDER INSERTION;  Surgeon: Isaias Cowman, MD;  Location: [facility];  Service: Cardiovascular;  Laterality: N/A;   Social History:  reports that he has been smoking cigarettes. He has a 20.00 pack-year smoking history. He has never used smokeless tobacco. He reports current alcohol use of about 42.0 standard drinks of alcohol per week. He reports current drug use. Drug: Marijuana.  Allergies  Allergen Reactions   Atorvastatin Hives and Itching    All over trunk of body   Levaquin [Levofloxacin] Hives and Other (  See Comments)    Rash, itching all over trunk of body    Family History  Problem Relation Age of Onset   Hypertension Other     Prior to Admission medications   Medication Sig Start Date End Date Taking? Authorizing Provider  albuterol (PROVENTIL HFA;VENTOLIN HFA) 108 (90 BASE) MCG/ACT inhaler Inhale 2 puffs into the lungs every 6 (six) hours as needed for wheezing or shortness of breath.   Yes [provider]  aspirin EC 81 MG tablet Take 81 mg by  mouth daily. Swallow whole.   Yes [provider]  budesonide-formoterol (SYMBICORT) 160-4.5 MCG/ACT inhaler Inhale 2 puffs into the lungs 2 (two) times daily.   Yes [provider]  busPIRone (BUSPAR) 10 MG tablet Take 10 mg by mouth 3 (three) times daily. 07/24/19  Yes [provider]  diphenhydrAMINE (BENADRYL) 25 MG tablet Take 25 mg by mouth daily.   Yes [provider]  gabapentin (NEURONTIN) 300 MG capsule Take 300 mg by mouth 2 (two) times daily.  08/15/17 08/31/22 Yes [provider]  hydrochlorothiazide (HYDRODIURIL) 12.5 MG tablet Take 12.5 mg by mouth daily. 08/18/22  Yes [provider]  Ipratropium-Albuterol (COMBIVENT RESPIMAT) 20-100 MCG/ACT AERS respimat Inhale 2 puffs into the lungs in the morning and at bedtime.   Yes [provider]  ipratropium-albuterol (DUONEB) 0.5-2.5 (3) MG/3ML SOLN Take 3 mLs by nebulization every 6 (six) hours as needed (for shortness of breath/wheezing).   Yes [provider]  lamoTRIgine (LAMICTAL) 150 MG tablet Take 150 mg by mouth daily. In the morning 04/22/20  Yes [provider]  lamoTRIgine (LAMICTAL) 200 MG tablet Take 200 mg by mouth at bedtime. In the evening. 04/22/20  Yes [provider]  losartan (COZAAR) 25 MG tablet Take 25 mg by mouth daily. 08/18/22 08/18/23 Yes [provider]  metoprolol tartrate (LOPRESSOR) 50 MG tablet Take 50 mg by mouth 2 (two) times daily.  12/07/17  Yes [provider]  Multiple Vitamin (MULTIVITAMIN WITH MINERALS) TABS tablet Take 1 tablet by mouth daily.   Yes [provider]  potassium chloride SA (KLOR-CON M) 20 MEQ tablet Take 10 mEq by mouth 2 (two) times daily.   Yes [provider]  Vitamin D, Ergocalciferol, (DRISDOL) 1.25 MG (50000 UNIT) CAPS capsule Take 50,000 Units by mouth once a week. 08/09/22  Yes [provider]  amLODipine (NORVASC) 10 MG tablet Take 10 mg by mouth  daily. Patient not taking: Reported on 08/31/2022 08/07/22   [provider]  B Complex-C (B-COMPLEX WITH VITAMIN C) tablet Take 1 tablet by mouth daily. Patient not taking: Reported on 05/24/2022    [provider]  gemfibrozil (LOPID) 600 MG tablet Take 600 mg by mouth 2 (two) times daily. Patient not taking: Reported on 05/24/2022 06/30/19   [provider]  mupirocin ointment (BACTROBAN) 2 % Apply 1 Application topically daily. With dressing changes Patient not taking: Reported on 08/31/2022 06/22/22   Ralene Bathe, MD  vitamin B-12 (CYANOCOBALAMIN) 1000 MCG tablet Take 1,000 mcg by mouth daily. Patient not taking: Reported on 05/24/2022    [provider]    Physical Exam: Vitals:   08/31/22 2014 08/31/22 2018 08/31/22 2300  BP:  (!) 140/101 94/68  Pulse:  92 92  Resp:  (!) 22   Temp:  98.4 F (36.9 C)   TempSrc:  Oral   SpO2:  96% 92%  Weight: 88.5 kg    Height: 5' 8"$  (1.727 m)  Physical Exam Vitals and nursing note reviewed.  Constitutional:      General: He is not in acute distress. HENT:     Head: Normocephalic and atraumatic.  Cardiovascular:     Rate and Rhythm: Normal rate and regular rhythm.     Heart sounds: Normal heart sounds.  Pulmonary:     Effort: Tachypnea present.     Breath sounds: Wheezing and rhonchi present.  Abdominal:     Palpations: Abdomen is soft.     Tenderness: There is no abdominal tenderness.  Musculoskeletal:     Right lower leg: Edema present.     Left lower leg: Edema present.  Neurological:     Mental Status: Mental status is at baseline.     Labs on Admission: I have personally reviewed following labs and imaging studies  CBC: Recent Labs  Lab 08/31/22 2013  WBC 6.3  HGB 16.2  HCT 46.6  MCV 99.1  PLT [id number]   Basic Metabolic Panel: Recent Labs  Lab 08/31/22 2013  NA 132*  K 3.6  CL 92*  CO2 27  GLUCOSE 113*  BUN 8  CREATININE 0.79  CALCIUM 9.8   GFR: Estimated  Creatinine Clearance: 112.7 mL/min (by C-G formula based on SCr of 0.79 mg/dL). Liver Function Tests: Recent Labs  Lab 08/31/22 2017  AST 58*  ALT 46*  ALKPHOS 126  BILITOT 1.5*  PROT 7.3  ALBUMIN 3.6   No results for input(s): "LIPASE", "AMYLASE" in the last 168 hours. No results for input(s): "AMMONIA" in the last 168 hours. Coagulation Profile: No results for input(s): "INR", "PROTIME" in the last 168 hours. Cardiac Enzymes: No results for input(s): "CKTOTAL", "CKMB", "CKMBINDEX", "TROPONINI" in the last 168 hours. BNP (last 3 results) No results for input(s): "PROBNP" in the last 8760 hours. HbA1C: No results for input(s): "HGBA1C" in the last 72 hours. CBG: No results for input(s): "GLUCAP" in the last 168 hours. Lipid Profile: No results for input(s): "CHOL", "HDL", "LDLCALC", "TRIG", "CHOLHDL", "LDLDIRECT" in the last 72 hours. Thyroid Function Tests: No results for input(s): "TSH", "T4TOTAL", "FREET4", "T3FREE", "THYROIDAB" in the last 72 hours. Anemia Panel: No results for input(s): "VITAMINB12", "FOLATE", "FERRITIN", "TIBC", "IRON", "RETICCTPCT" in the last 72 hours. Urine analysis:    Component Value Date/Time   COLORURINE AMBER (A) 04/13/2022 1648   APPEARANCEUR CLEAR (A) 04/13/2022 1648   APPEARANCEUR Clear 04/24/2014 1338   LABSPEC 1.015 04/13/2022 1648   LABSPEC 1.018 04/24/2014 1338   PHURINE 5.0 04/13/2022 1648   GLUCOSEU NEGATIVE 04/13/2022 1648   GLUCOSEU Negative 04/24/2014 1338   HGBUR NEGATIVE 04/13/2022 1648   BILIRUBINUR NEGATIVE 04/13/2022 1648   BILIRUBINUR Negative 04/24/2014 [place] 04/13/2022 1648   PROTEINUR NEGATIVE 04/13/2022 1648   NITRITE NEGATIVE 04/13/2022 1648   LEUKOCYTESUR TRACE (A) 04/13/2022 1648   LEUKOCYTESUR Negative 04/24/2014 1338    Radiological Exams on Admission: DG Chest Port 1 View  Result Date: 08/31/2022 CLINICAL DATA:  Shortness of breath EXAM: PORTABLE CHEST 1 VIEW COMPARISON:  01/09/2021  FINDINGS: The heart size and mediastinal contours are within normal limits. Both lungs are clear. The visualized skeletal structures are unremarkable. Loop recorder is again seen. IMPRESSION: No active disease. Electronically Signed   By: Inez Catalina M.D.   On: 08/31/2022 20:39     Data Reviewed: Relevant notes from primary care and specialist visits, past discharge summaries as available in EHR, including Care Everywhere. Prior diagnostic testing as pertinent to current admission diagnoses Updated medications and  problem lists for reconciliation ED course, including vitals, labs, imaging, treatment and response to treatment Triage notes, nursing and pharmacy notes and ED provider's notes Notable results as noted in HPI   Assessment and Plan: * COPD with acute exacerbation ([place]) Scheduled and as needed nebulized bronchodilator treatments IV steroids Antitussives Continue Supplemental oxygen . 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 Last seen 08/18/21  Hyponatremia Likely related to alcohol use  Bilateral lower extremity edema Follow lower extremity Doppler ordered by the ED Was seen by Cardiology recently on 2/7 and they attributed it to amlodipine, since discontinued  Transaminitis Possibly related to alcohol use, however patient has a history of chronic cholecystitis Acute hepatitis panel Right upper quadrant ultrasound--> hepatic steatosis, prior cholecystectomy  HTN (hypertension) Continue metoprolol, losartan, HCTZ,   Nonintractable epilepsy without status epilepticus ([place]) Continue lamotrigine and gabapentin  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 Counseled on cutting back CIWA withdrawal protocol    DVT prophylaxis:  lovenox Consults: none  Advance Care Planning:    Code Status: Prior   Family Communication: none  Disposition Plan: Back to previous home environment  Severity of Illness: The appropriate patient status for this patient is OBSERVATION. Observation status is judged to be reasonable and necessary in order to provide the required intensity of service to ensure the patient's safety. The patient's presenting symptoms, physical exam findings, and initial radiographic and laboratory data in the context of their medical condition is felt to place them at decreased risk for further clinical deterioration. Furthermore, it is anticipated that the patient will be medically stable for discharge from the hospital within 2 midnights of admission.   Author: Athena Masse, MD 08/31/2022 11:14 PM  For on call review [email or web address].

## 2022-08-31 NOTE — Assessment & Plan Note (Signed)
Likely related to alcohol use

## 2022-08-31 NOTE — Assessment & Plan Note (Signed)
Follow lower extremity Doppler

## 2022-09-01 ENCOUNTER — Encounter: Payer: Self-pay | Admitting: Internal Medicine

## 2022-09-01 ENCOUNTER — Observation Stay: Payer: Medicaid Other

## 2022-09-01 DIAGNOSIS — F1090 Alcohol use, unspecified, uncomplicated: Secondary | ICD-10-CM | POA: Diagnosis not present

## 2022-09-01 DIAGNOSIS — I69359 Hemiplegia and hemiparesis following cerebral infarction affecting unspecified side: Secondary | ICD-10-CM | POA: Diagnosis not present

## 2022-09-01 DIAGNOSIS — J441 Chronic obstructive pulmonary disease with (acute) exacerbation: Secondary | ICD-10-CM | POA: Diagnosis not present

## 2022-09-01 DIAGNOSIS — R600 Localized edema: Secondary | ICD-10-CM

## 2022-09-01 DIAGNOSIS — I10 Essential (primary) hypertension: Secondary | ICD-10-CM

## 2022-09-01 DIAGNOSIS — I499 Cardiac arrhythmia, unspecified: Secondary | ICD-10-CM | POA: Insufficient documentation

## 2022-09-01 LAB — HIV ANTIBODY (ROUTINE TESTING W REFLEX): HIV Screen 4th Generation wRfx: NONREACTIVE

## 2022-09-01 LAB — CBG MONITORING, ED: Glucose-Capillary: 178 mg/dL — ABNORMAL HIGH (ref 70–99)

## 2022-09-01 LAB — HEPATITIS PANEL, ACUTE
HCV Ab: NONREACTIVE
Hep A IgM: NONREACTIVE
Hep B C IgM: NONREACTIVE
Hepatitis B Surface Ag: NONREACTIVE

## 2022-09-01 MED ORDER — PREDNISONE 20 MG PO TABS: 40.0000 mg | ORAL_TABLET | Freq: Every day | ORAL | 0 refills | Status: AC

## 2022-09-01 MED ORDER — AZITHROMYCIN 500 MG PO TABS
500.0000 mg | ORAL_TABLET | Freq: Every day | ORAL | Status: AC
  Administered 2022-09-01: 500 mg via ORAL
  Filled 2022-09-01: qty 1

## 2022-09-01 MED ORDER — AZITHROMYCIN 250 MG PO TABS: 250.0000 mg | ORAL_TABLET | Freq: Every day | ORAL | 0 refills | Status: AC

## 2022-09-01 MED ORDER — IPRATROPIUM-ALBUTEROL 0.5-2.5 (3) MG/3ML IN SOLN
3.0000 mL | RESPIRATORY_TRACT | Status: DC
  Administered 2022-09-01: 3 mL via RESPIRATORY_TRACT
  Filled 2022-09-01: qty 3

## 2022-09-01 MED ORDER — AZITHROMYCIN 500 MG PO TABS: 250.0000 mg | ORAL_TABLET | Freq: Every day | ORAL | Status: DC

## 2022-09-01 MED ORDER — IPRATROPIUM-ALBUTEROL 0.5-2.5 (3) MG/3ML IN SOLN: 3.0000 mL | Freq: Four times a day (QID) | RESPIRATORY_TRACT | 0 refills | Status: AC | PRN

## 2022-09-01 NOTE — Assessment & Plan Note (Signed)
Follows with cardiology. Last seen 08/18/21

## 2022-09-01 NOTE — Discharge Summary (Signed)
Physician Discharge Summary   Patient: George Arellano MRN: [id number] DOB: October 15, 1965  Admit date:     08/31/2022  Discharge date: 09/01/22  Discharge Physician: Lorella Nimrod   PCP: System, Provider Not In   Recommendations at discharge:  Please obtain CBC and BMP in 1 week Please ensure the completion of Zithromax and prednisone Continue counseling for smoking cessation. 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   George Arellano is a 57 y.o. male with medical history significant for COPD previously on nighttime O2 at 2 L, alcohol use disorder, tobacco use disorder, HTN, prior stroke who presents to the ED with shortness of breath and wheezing unresolved with home bronchodilators.  He denied chest pain, fever or chills.  ED course and data review: BP 140/101 on arrival tachypneic to 22 and O2 sat 96% on 2 L.  VBG showed pH 7.47 and pCO2 of 46.  Respiratory viral panel negative.  Troponin 8 and BNP 38.5.Marland Kitchen    CBC and BMP unremarkable except for sodium of 132.  Hepatic panel with mild transaminitis which seems chronic.  EKG, personally viewed and interpreted showing NSR at 89 with nonspecific ST-T wave changes.  Chest x-ray showed no active disease Lower extremity venous Doppler ordered results pending at the time of admission.  2/21: Hemodynamically stable.  Lower extremity venous Doppler was negative for DVT.  RUQ ultrasound with hepatic steatosis without focal liver lesions and prior cholecystectomy. Patient was ambulated and having very minor  desaturation to 89% on room air and remained in mid 90s while resting.  Patient has oxygen at home which he was normally using at night. He can use during ambulation for the next couple of days to keep his saturation above 90%. He was also started on Z-Pak and was given 5 days of prednisone. Counseling was provided for smoking.  He will continue the rest of his home medications and need to have a close follow-up with his providers for further recommendations.    Assessment and Plan: * COPD with acute exacerbation ([place]) Scheduled and as needed nebulized bronchodilator treatments IV steroids Antitussives Continue Supplemental oxygen . 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 Last seen 08/18/21  Hyponatremia Likely related to alcohol use  Bilateral lower extremity edema Follow lower extremity Doppler ordered by the ED Was seen by Cardiology recently on 2/7 and they attributed it to amlodipine, since discontinued  Transaminitis Possibly related to alcohol use, however patient has a history of chronic cholecystitis Acute hepatitis panel Right upper quadrant ultrasound--> hepatic steatosis, prior cholecystectomy  HTN (hypertension) Continue metoprolol, losartan, HCTZ,   Nonintractable epilepsy without status epilepticus ([place]) Continue lamotrigine and gabapentin  Hemiparesis affecting nondominant side as late effect of stroke ([place]) Continue aspirin and gemfibrozil  Alcohol use disorder Drinks a 6-pack of beer daily. Counseled on cutting back CIWA withdrawal protocol   Consultants: None Procedures performed: None Disposition: Home Diet recommendation:  Discharge Diet Orders (From admission, onward)     Start     Ordered   09/01/22  0000  Diet - low sodium heart healthy        09/01/22 1411           Cardiac diet DISCHARGE MEDICATION: Allergies as of 09/01/2022       Reactions   Atorvastatin  Hives, Itching   All over trunk of body   Levaquin [levofloxacin] Hives, Other (See Comments)   Rash, itching all over trunk of body        Medication List     STOP taking these medications    amLODipine 10 MG tablet Commonly known as: NORVASC   B-complex with vitamin C tablet   gemfibrozil 600 MG tablet Commonly known as: LOPID   mupirocin ointment 2 % Commonly known as: BACTROBAN       TAKE these medications    albuterol 108 (90 Base) MCG/ACT inhaler Commonly known as: VENTOLIN HFA Inhale 2 puffs into the lungs every 6 (six) hours as needed for wheezing or shortness of breath.   aspirin EC 81 MG tablet Take 81 mg by mouth daily. Swallow whole.   azithromycin 250 MG tablet Commonly known as: ZITHROMAX Take 1 tablet (250 mg total) by mouth daily for 4 days. Start taking on: September 02, 2022   budesonide-formoterol 160-4.5 MCG/ACT inhaler Commonly known as: SYMBICORT Inhale 2 puffs into the lungs 2 (two) times daily.   busPIRone 10 MG tablet Commonly known as: BUSPAR Take 10 mg by mouth 3 (three) times daily.   Combivent Respimat 20-100 MCG/ACT Aers respimat Generic drug: Ipratropium-Albuterol Inhale 2 puffs into the lungs in the morning and at bedtime.   ipratropium-albuterol 0.5-2.5 (3) MG/3ML Soln Commonly known as: DUONEB Take 3 mLs by nebulization every 6 (six) hours as needed (for shortness of breath/wheezing).   cyanocobalamin 1000 MCG tablet Commonly known as: VITAMIN B12 Take 1,000 mcg by mouth daily.   diphenhydrAMINE 25 MG tablet Commonly known as: BENADRYL Take 25 mg by mouth daily.   gabapentin 300 MG capsule Commonly known as: NEURONTIN Take 300 mg by mouth 2 (two) times daily.   hydrochlorothiazide 12.5 MG tablet Commonly known as: HYDRODIURIL Take 12.5 mg by mouth daily.   lamoTRIgine 150 MG tablet Commonly known as: LAMICTAL Take 150 mg by mouth daily. In the morning   lamoTRIgine 200 MG tablet Commonly known as:  LAMICTAL Take 200 mg by mouth at bedtime. In the evening.   losartan 25 MG tablet Commonly known as: COZAAR Take 25 mg by mouth daily.   metoprolol tartrate 50 MG tablet Commonly known as: LOPRESSOR Take 50 mg by mouth 2 (two) times daily.   multivitamin with minerals Tabs tablet Take 1 tablet by mouth daily.   potassium chloride SA 20 MEQ tablet Commonly known as: KLOR-CON M Take 10 mEq by mouth 2 (two) times daily.   predniSONE 20 MG tablet Commonly known as: DELTASONE Take 2 tablets (40 mg total) by mouth daily with breakfast for 5 days. Start taking on: September 02, 2022   Vitamin D (Ergocalciferol) 1.25 MG (50000 UNIT) Caps capsule Commonly known as: DRISDOL Take 50,000 Units by mouth once a week.        Discharge Exam: Filed Weights   08/31/22 2014  Weight: 88.5 kg   General.     In no acute distress. Pulmonary.  Lungs clear bilaterally, normal respiratory effort. CV.  Regular rate and rhythm, no JVD, rub or murmur. Abdomen.  Soft, nontender, nondistended, BS positive. CNS.  Alert and oriented .  No focal neurologic deficit. Extremities.  No edema, no cyanosis, pulses intact and symmetrical. Psychiatry.  Judgment and insight appears normal.   Condition at discharge: stable  The results of significant diagnostics from this hospitalization (including imaging, microbiology, ancillary and laboratory) are listed below for reference.   Imaging Studies: US Abdomen Limited RUQ (LIVER/GB)  Result Date: 09/01/2022 CLINICAL DATA:  History of prior cholecystectomy and abnormal liver function tests. EXAM: ULTRASOUND ABDOMEN LIMITED RIGHT UPPER QUADRANT COMPARISON:  None Available. FINDINGS: Gallbladder: The gallbladder is surgically absent. Common bile duct: Diameter: 4.0 mm Liver: No focal lesion identified. Diffusely increased echogenicity of the liver parenchyma is noted. Portal vein is patent on color Doppler imaging with normal direction of blood flow towards the  liver. Other: None. IMPRESSION: 1. Findings consistent with history of prior cholecystectomy. 2. Hepatic steatosis without focal liver lesions. Electronically Signed   By: Virgina Norfolk M.D.   On: 09/01/2022 02:03   US Venous Img Lower Bilateral (DVT)  Result Date: 08/31/2022 CLINICAL DATA:  Bilateral leg swelling for 2 weeks EXAM: BILATERAL LOWER EXTREMITY VENOUS DOPPLER ULTRASOUND TECHNIQUE: Gray-scale sonography with compression, as well as color and duplex ultrasound, were performed to evaluate the deep venous system(s) from the level of the common femoral vein through the popliteal and proximal calf veins. COMPARISON:  None Available. FINDINGS: VENOUS Normal compressibility of the bilateral common femoral, superficial femoral, and popliteal veins, as well as the visualized calf veins. Visualized portions of profunda femoral vein and great saphenous vein unremarkable. No filling defects to suggest DVT on grayscale or color Doppler imaging. Doppler waveforms show normal direction of venous flow, normal respiratory plasticity and response to augmentation. OTHER None. Limitations: none IMPRESSION: Negative. Electronically Signed   By: Keane Police D.O.   On: 08/31/2022 23:45   DG Chest Port 1 View  Result Date: 08/31/2022 CLINICAL DATA:  Shortness of breath EXAM: PORTABLE CHEST 1 VIEW COMPARISON:  01/09/2021 FINDINGS: The heart size and mediastinal contours are within normal limits. Both lungs are clear. The visualized skeletal structures are unremarkable. Loop recorder is again seen. IMPRESSION: No active disease. Electronically Signed   By: Inez Catalina M.D.   On: 08/31/2022 20:39    Microbiology: Results for orders placed or performed during the hospital encounter of 08/31/22  Resp panel by RT-PCR (RSV, Flu A&B, Covid) Anterior Nasal Swab     Status: None   Collection Time: 08/31/22  9:19 PM   Specimen: Anterior Nasal Swab  Result Value Ref Range Status   SARS Coronavirus 2 by RT PCR NEGATIVE  NEGATIVE Final    Comment: (NOTE) SARS-CoV-2 target nucleic acids are NOT DETECTED.  The SARS-CoV-2 RNA is generally detectable in upper respiratory specimens during the acute phase of infection. The lowest concentration of SARS-CoV-2 viral copies this assay can detect is 138 copies/mL. A negative result does not preclude SARS-Cov-2 infection and should not be used as the sole basis for treatment or other patient management decisions. A negative result may occur with  improper specimen collection/handling, submission of specimen other than nasopharyngeal swab, presence of viral mutation(s) within the areas targeted by this assay, and inadequate number of viral copies(<138 copies/mL). A negative result must be combined with clinical observations, patient history, and epidemiological information. The expected result is Negative.  Fact Sheet for Patients:  EntrepreneurPulse.com.au  Fact Sheet for Healthcare Providers:  IncredibleEmployment.be  This test is no t yet approved or cleared by the [place] FDA and  has been authorized for detection and/or diagnosis of SARS-CoV-2 by FDA under  an Emergency Use Authorization (EUA). This EUA will remain  in effect (meaning this test can be used) for the duration of the COVID-19 declaration under Section 564(b)(1) of the Act, 21 U.S.C.section 360bbb-3(b)(1), unless the authorization is terminated  or revoked sooner.       Influenza A by PCR NEGATIVE NEGATIVE Final   Influenza B by PCR NEGATIVE NEGATIVE Final    Comment: (NOTE) The Xpert Xpress SARS-CoV-2/FLU/RSV plus assay is intended as an aid in the diagnosis of influenza from Nasopharyngeal swab specimens and should not be used as a sole basis for treatment. Nasal washings and aspirates are unacceptable for Xpert Xpress SARS-CoV-2/FLU/RSV testing.  Fact Sheet for Patients: EntrepreneurPulse.com.au  Fact Sheet for Healthcare  Providers: IncredibleEmployment.be  This test is not yet approved or cleared by the [place] FDA and has been authorized for detection and/or diagnosis of SARS-CoV-2 by FDA under an Emergency Use Authorization (EUA). This EUA will remain in effect (meaning this test can be used) for the duration of the COVID-19 declaration under Section 564(b)(1) of the Act, 21 U.S.C. section 360bbb-3(b)(1), unless the authorization is terminated or revoked.     Resp Syncytial Virus by PCR NEGATIVE NEGATIVE Final    Comment: (NOTE) Fact Sheet for Patients: EntrepreneurPulse.com.au  Fact Sheet for Healthcare Providers: IncredibleEmployment.be  This test is not yet approved or cleared by the [place] FDA and has been authorized for detection and/or diagnosis of SARS-CoV-2 by FDA under an Emergency Use Authorization (EUA). This EUA will remain in effect (meaning this test can be used) for the duration of the COVID-19 declaration under Section 564(b)(1) of the Act, 21 U.S.C. section 360bbb-3(b)(1), unless the authorization is terminated or revoked.  Performed at [facility], [address] [place], [place] [address]     Labs: CBC: Recent Labs  Lab 08/31/22 2013  WBC 6.3  HGB 16.2  HCT 46.6  MCV 99.1  PLT [id number]   Basic Metabolic Panel: Recent Labs  Lab 08/31/22 2013  NA 132*  K 3.6  CL 92*  CO2 27  GLUCOSE 113*  BUN 8  CREATININE 0.79  CALCIUM 9.8   Liver Function Tests: Recent Labs  Lab 08/31/22 2017  AST 58*  ALT 46*  ALKPHOS 126  BILITOT 1.5*  PROT 7.3  ALBUMIN 3.6   CBG: Recent Labs  Lab 09/01/22 0725  GLUCAP 178*    Discharge time spent: greater than 30 minutes.  This record has been created using Systems analyst. Errors have been sought and corrected,but may not always be located. Such creation errors do not reflect on the standard of care.   Signed: Lorella Nimrod,  MD [facility] 09/01/2022

## 2022-09-01 NOTE — Hospital Course (Addendum)
Taken from H&P.   George Arellano is a 57 y.o. male with medical history significant for COPD previously on nighttime O2 at 2 L, alcohol use disorder, tobacco use disorder, HTN, prior stroke who presents to the ED with shortness of breath and wheezing unresolved with home bronchodilators.  He denied chest pain, fever or chills.  ED course and data review: BP 140/101 on arrival tachypneic to 22 and O2 sat 96% on 2 L.  VBG showed pH 7.47 and pCO2 of 46.  Respiratory viral panel negative.  Troponin 8 and BNP 38.5.Marland Kitchen    CBC and BMP unremarkable except for sodium of 132.  Hepatic panel with mild transaminitis which seems chronic.  EKG, personally viewed and interpreted showing NSR at 89 with nonspecific ST-T wave changes.  Chest x-ray showed no active disease Lower extremity venous Doppler ordered results pending at the time of admission.  2/21: Hemodynamically stable.  Lower extremity venous Doppler was negative for DVT.  RUQ ultrasound with hepatic steatosis without focal liver lesions and prior cholecystectomy. Patient was ambulated and having very minor desaturation to 89% on room air and remained in mid 90s while resting.  Patient has oxygen at home which he was normally using at night. He can use during ambulation for the next couple of days to keep his saturation above 90%. He was also started on Z-Pak and was given 5 days of prednisone. Counseling was provided for smoking.  He will continue the rest of his home medications and need to have a close follow-up with his providers for further recommendations.

## 2022-09-01 NOTE — ED Notes (Signed)
[place], NP made aware of patient's decreased blood pressure readings. Patient currently resting, denies pain, denies any concerns. No distress noted.

## 2022-09-01 NOTE — Progress Notes (Signed)
SATURATION QUALIFICATIONS: (This note is used to comply with regulatory documentation for home oxygen)  Patient Saturations on Room Air at Rest = 94%  Patient Saturations on Room Air while Ambulating =89%    

## 2022-10-01 ENCOUNTER — Other Ambulatory Visit: Payer: Self-pay | Admitting: *Deleted

## 2022-10-01 DIAGNOSIS — Z87891 Personal history of nicotine dependence: Secondary | ICD-10-CM

## 2022-10-01 DIAGNOSIS — Z122 Encounter for screening for malignant neoplasm of respiratory organs: Secondary | ICD-10-CM

## 2022-10-01 DIAGNOSIS — F17210 Nicotine dependence, cigarettes, uncomplicated: Secondary | ICD-10-CM

## 2022-10-27 ENCOUNTER — Ambulatory Visit (INDEPENDENT_AMBULATORY_CARE_PROVIDER_SITE_OTHER): Payer: Medicaid Other | Admitting: Acute Care

## 2022-10-27 ENCOUNTER — Encounter: Payer: Self-pay | Admitting: Acute Care

## 2022-10-27 DIAGNOSIS — F17210 Nicotine dependence, cigarettes, uncomplicated: Secondary | ICD-10-CM

## 2022-10-27 NOTE — Patient Instructions (Signed)
Thank you for participating in the [facility] Scre[facility] Program. It was our pleasure to meet you today. We will call you with the results of your scan within the next few days. Your scan will be assigned a Lung RADS category score by the physicians reading the scans.  This Lung RADS score determines follow up scanning.  See below for description of categories, and follow up screening recommendations. We will be in touch to schedule your follow up screening annually or based on recommendations of our providers. We will fax a copy of your scan results to your Primary Care Physician, or the physician who referred you to the program, to ensure they have the results. Please call the office if you have any questions or concerns regarding your scanning experience or results.  Our office number is [phone]. Please speak with Denise Phelps, RN. , or  Denise Buckner RN, They are  our Lung Cancer Screening RN.'s If They are unavailable when you call, Please leave a message on the voice mail. We will return your call at our earliest convenience.This voice mail is monitored several times a day.  Remember, if your scan is normal, we will scan you annually as long as you continue to meet the criteria for the program. (Age 50-80, Current smoker or smoker who has quit within the last 15 years). If you are a smoker, remember, quitting is the single most powerful action that you can take to decrease your risk of lung cancer and other pulmonary, breathing related problems. We know quitting is hard, and we are here to help.  Please let us know if there is anything we can do to help you meet your goal of quitting. If you are a former smoker, congratulations. We are proud of you! Remain smoke free! Remember you can refer friends or family members through the number above.  We will screen them to make sure they meet criteria for the program. Thank you for helping us take better care of you by  participating in Lung Screening.  You can receive free nicotine replacement therapy ( patches, gum or mints) by calling 1-800-QUIT NOW. Please call so we can get you on the path to becoming  a non-smoker. I know it is hard, but you can do this!  Lung RADS Categories:  Lung RADS 1: no nodules or definitely non-concerning nodules.  Recommendation is for a repeat annual scan in 12 months.  Lung RADS 2:  nodules that are non-concerning in appearance and behavior with a very low likelihood of becoming an active cancer. Recommendation is for a repeat annual scan in 12 months.  Lung RADS 3: nodules that are probably non-concerning , includes nodules with a low likelihood of becoming an active cancer.  Recommendation is for a 6-month repeat screening scan. Often noted after an upper respiratory illness. We will be in touch to make sure you have no questions, and to schedule your 6-month scan.  Lung RADS 4 A: nodules with concerning findings, recommendation is most often for a follow up scan in 3 months or additional testing based on our provider's assessment of the scan. We will be in touch to make sure you have no questions and to schedule the recommended 3 month follow up scan.  Lung RADS 4 B:  indicates findings that are concerning. We will be in touch with you to schedule additional diagnostic testing based on our provider's  assessment of the scan.  Other options for assistance in smoking cessation (   As covered by your insurance benefits)  Hypnosis for smoking cessation  [employer] [phone]  Acupuncture for smoking cessation  [facility] [phone]   

## 2022-10-27 NOTE — Progress Notes (Signed)
Virtual Visit via Telephone Note  I connected with George Arellano on 10/27/22 at  9:30 AM EDT by telephone and verified that I am speaking with the correct person using two identifiers.  Location: Patient:  At home Provider: [address] [place], [place], [address]    I discussed the limitations, risks, security and privacy concerns of performing an evaluation and management service by telephone and the availability of in person appointments. I also discussed with the patient that there may be a patient responsible charge related to this service. The patient expressed understanding and agreed to proceed.    Shared Decision Making Visit Lung Cancer Screening Program [id number])   Eligibility: Age 57 y.o. Pack Years Smoking History Calculation 63 pack year smoking history (# packs/per year x # years smoked) Recent History of coughing up blood  no Unexplained weight loss? no ( >Than 15 pounds within the last 6 months ) Prior History Lung / other cancer no (Diagnosis within the last 5 years already requiring surveillance chest CT Scans). Smoking Status Current Smoker Former Smokers: Years since quit:  NA  Quit Date: NA  Visit Components: Discussion included one or more decision making aids. yes Discussion included risk/benefits of screening. yes Discussion included potential follow up diagnostic testing for abnormal scans. yes Discussion included meaning and risk of over diagnosis. yes Discussion included meaning and risk of False Positives. yes Discussion included meaning of total radiation exposure. yes  Counseling Included: Importance of adherence to annual lung cancer LDCT screening. yes Impact of comorbidities on ability to participate in the program. yes Ability and willingness to under diagnostic treatment. yes  Smoking Cessation Counseling: Current Smokers:  Discussed importance of smoking cessation. yes Information about tobacco cessation classes and interventions  provided to patient. yes Patient provided with "ticket" for LDCT Scan. yes Symptomatic Patient. no  Counseling NA Diagnosis Code: Tobacco Use Z72.0 Asymptomatic Patient yes  Counseling (Intermediate counseling: > three minutes counseling) U0454 Former Smokers:  Discussed the importance of maintaining cigarette abstinence. yes Diagnosis Code: Personal History of Nicotine Dependence. U98.119 Information about tobacco cessation classes and interventions provided to patient. Yes Patient provided with "ticket" for LDCT Scan. yes Written Order for Lung Cancer Screening with LDCT placed in Epic. Yes (CT Chest Lung Cancer Screening Low Dose W/O CM) [id number] Z12.2-Screening of respiratory organs Z87.891-Personal history of nicotine dependence  I have spent 25 minutes of face to face/ virtual visit   time with  George Arellano discussing the risks and benefits of lung cancer screening. We viewed / discussed a power point together that explained in detail the above noted topics. We paused at intervals to allow for questions to be asked and answered to ensure understanding.We discussed that the single most powerful action that he can take to decrease his risk of developing lung cancer is to quit smoking. We discussed whether or not he is ready to commit to setting a quit date. We discussed options for tools to aid in quitting smoking including nicotine replacement therapy, non-nicotine medications, support groups, Quit Smart classes, and behavior modification. We discussed that often times setting smaller, more achievable goals, such as eliminating 1 cigarette a day for a week and then 2 cigarettes a day for a week can be helpful in slowly decreasing the number of cigarettes smoked. This allows for a sense of accomplishment as well as providing a clinical benefit. I provided  him  with smoking cessation  information  with contact information for community resources, classes, free  nicotine replacement therapy, and  access to mobile apps, text messaging, and on-line smoking cessation help. I have also provided  him  the office contact information in the event he needs to contact me, or the screening staff. We discussed the time and location of the scan, and that either Abigail Miyamoto RN, Karlton Lemon, RN  or I will call / send a letter with the results within 24-72 hours of receiving them. The patient verbalized understanding of all of  the above and had no further questions upon leaving the office. They have my contact information in the event they have any further questions.  I spent 3 minutes counseling on smoking cessation and the health risks of continued tobacco abuse.  I explained to the patient that there has been a high incidence of coronary artery disease noted on these exams. I explained that this is a non-gated exam therefore degree or severity cannot be determined. This patient is not on statin therapy. I have asked the patient to follow-up with their PCP regarding any incidental finding of coronary artery disease and management with diet or medication as their PCP  feels is clinically indicated. The patient verbalized understanding of the above and had no further questions upon completion of the visit.      Bevelyn Ngo, NP 10/27/2022

## 2022-10-28 ENCOUNTER — Ambulatory Visit
Admission: RE | Admit: 2022-10-28 | Discharge: 2022-10-28 | Disposition: A | Discharge status: Home / Self Care | Payer: Medicaid Other | Source: Ambulatory Visit | Attending: Acute Care | Admitting: Acute Care

## 2022-10-28 DIAGNOSIS — Z122 Encounter for screening for malignant neoplasm of respiratory organs: Secondary | ICD-10-CM | POA: Diagnosis present

## 2022-10-28 DIAGNOSIS — Z87891 Personal history of nicotine dependence: Secondary | ICD-10-CM | POA: Diagnosis not present

## 2022-10-28 DIAGNOSIS — F17210 Nicotine dependence, cigarettes, uncomplicated: Secondary | ICD-10-CM | POA: Diagnosis present

## 2022-11-01 ENCOUNTER — Other Ambulatory Visit: Payer: Self-pay | Admitting: Acute Care

## 2022-11-01 DIAGNOSIS — Z87891 Personal history of nicotine dependence: Secondary | ICD-10-CM

## 2022-11-01 DIAGNOSIS — F17210 Nicotine dependence, cigarettes, uncomplicated: Secondary | ICD-10-CM

## 2022-11-01 DIAGNOSIS — Z122 Encounter for screening for malignant neoplasm of respiratory organs: Secondary | ICD-10-CM

## 2023-07-25 ENCOUNTER — Emergency Department
Admission: EM | Admit: 2023-07-25 | Discharge: 2023-07-25 | Discharge status: Against Medical Advice | Payer: Medicaid Other | Attending: Emergency Medicine | Admitting: Emergency Medicine

## 2023-07-25 ENCOUNTER — Emergency Department: Payer: Medicaid Other

## 2023-07-25 ENCOUNTER — Encounter: Payer: Self-pay | Admitting: Emergency Medicine

## 2023-07-25 ENCOUNTER — Other Ambulatory Visit: Payer: Self-pay

## 2023-07-25 DIAGNOSIS — J449 Chronic obstructive pulmonary disease, unspecified: Secondary | ICD-10-CM | POA: Diagnosis not present

## 2023-07-25 DIAGNOSIS — R0602 Shortness of breath: Secondary | ICD-10-CM | POA: Diagnosis present

## 2023-07-25 DIAGNOSIS — Z5321 Procedure and treatment not carried out due to patient leaving prior to being seen by health care provider: Secondary | ICD-10-CM | POA: Insufficient documentation

## 2023-07-25 LAB — BASIC METABOLIC PANEL
Anion gap: 16 — ABNORMAL HIGH (ref 5–15)
BUN: 6 mg/dL (ref 6–20)
CO2: 24 mmol/L (ref 22–32)
Calcium: 8.8 mg/dL — ABNORMAL LOW (ref 8.9–10.3)
Chloride: 99 mmol/L (ref 98–111)
Creatinine, Ser: 0.66 mg/dL (ref 0.61–1.24)
GFR, Estimated: >60 mL/min (ref >60)
Glucose, Bld: 113 mg/dL — ABNORMAL HIGH (ref 70–99)
Potassium: 3.3 mmol/L — ABNORMAL LOW (ref 3.5–5.1)
Sodium: 139 mmol/L (ref 135–145)

## 2023-07-25 LAB — CBC
HCT: 48.1 % (ref 39.0–52.0)
Hemoglobin: 16.6 g/dL (ref 13.0–17.0)
MCH: 36.2 pg — ABNORMAL HIGH (ref 26.0–34.0)
MCHC: 34.5 g/dL (ref 30.0–36.0)
MCV: 105 fL — ABNORMAL HIGH (ref 80.0–100.0)
Platelets: 106 10*3/uL — ABNORMAL LOW (ref 150–400)
RBC: 4.58 MIL/uL (ref 4.22–5.81)
RDW: 12.4 % (ref 11.5–15.5)
WBC: 6.9 10*3/uL (ref 4.0–10.5)
nRBC: 0 % (ref 0.0–0.2)

## 2023-07-25 LAB — TROPONIN I (HIGH SENSITIVITY): Troponin I (High Sensitivity): 13 ng/L (ref <18)

## 2023-07-25 NOTE — ED Triage Notes (Signed)
 Pt in via [facility] from home; states he was thinking his BP was high based on how he was feeling today, so he called EMS.  States BP was actually low but unable to give me number.  Patient on 2L nasal cannula upon arrival; states he normally wears oxygen at night.  Pt complains of SOB, worse on exertion x 1 day.    Hx of COPD, takes routine neb treatments at home.  A/Ox4, NAD noted at this time.

## 2023-07-25 NOTE — ED Notes (Signed)
 First nurse note-pt brought in via ems from home with sob.  Iv in place. 125 mg solumedrol and 2 neb treatments given by ems.  Pt sats 81% on room air initially, now 95% on 2 liters,  pt alert sitting in wheelchair.George Arellano

## 2023-08-09 NOTE — Therapy (Incomplete)
OUTPATIENT PHYSICAL THERAPY NEURO EVALUATION   Patient Name: George Arellano MRN: [id number] DOB:1966/02/01, 58 y.o., male Today's Date: 08/09/2023   PCP: *** REFERRING PROVIDER: ***  END OF SESSION:   Past Medical History:  Diagnosis Date   Acute cholecystitis with chronic cholecystitis    Alcohol abuse    Anxiety    CHF (congestive heart failure) (HCC)    COPD (chronic obstructive pulmonary disease) (HCC)    Depression    Dyspnea    Dysrhythmia    A Fib   Hypercholesterolemia    Hypertension    Neuromuscular disorder (HCC)    Seizures (HCC) 12/2018   Stroke (HCC) 07/2017   balance issues, left sided deficit   Umbilical hernia without obstruction and without gangrene    Wears dentures    full upper and lower   Past Surgical History:  Procedure Laterality Date   CATARACT EXTRACTION W/PHACO Left 08/09/2019   Procedure: CATARACT EXTRACTION PHACO AND INTRAOCULAR LENS PLACEMENT (IOC) LEFT 3.75  00:40.6  9.2%;  Surgeon: Elliot Cousin, MD;  Location: [facility];  Service: Ophthalmology;  Laterality: Left;   CATARACT EXTRACTION W/PHACO Right 06/10/2020   Procedure: CATARACT EXTRACTION PHACO AND INTRAOCULAR LENS PLACEMENT (IOC) RIGHT;  Surgeon: Galen Manila, MD;  Location: [facility];  Service: Ophthalmology;  Laterality: Right;  2.62 0:31.5   CHOLECYSTECTOMY N/A 08/28/2018   Procedure: LAPAROSCOPIC CHOLECYSTECTOMY umbilical hernia repair;  Surgeon: Ancil Linsey, MD;  Location: [facility];  Service: General;  Laterality: N/A;   LOOP RECORDER INSERTION N/A 09/15/2017   Procedure: LOOP RECORDER INSERTION;  Surgeon: Marcina Millard, MD;  Location: [facility];  Service: Cardiovascular;  Laterality: N/A;   Patient Active Problem List   Diagnosis Date Noted   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) 09/01/2022   COPD with acute exacerbation (HCC) 08/31/2022   HTN (hypertension) 08/31/2022   Transaminitis 08/31/2022   Bilateral lower  extremity edema 08/31/2022   Hyponatremia 08/31/2022   Cholelithiasis 08/23/2018   Nonintractable epilepsy without status epilepticus (HCC) 01/06/2018   Arterial ischemic stroke, PCA (posterior cerebral artery), left, acute (HCC) 08/19/2017   Hemiparesis affecting nondominant side as late effect of stroke (HCC) 08/19/2017   Homonymous hemianopia, 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  Rationale for Evaluation and Treatment: {HABREHAB:27488}  SUBJECTIVE:  SUBJECTIVE STATEMENT: *** Pt accompanied by: {accompnied:27141}  PERTINENT HISTORY: ***  PAIN:  Are you having pain? {OPRCPAIN:27236}  PRECAUTIONS: {Therapy precautions:24002}  RED FLAGS: {PT Red Flags:29287}   WEIGHT BEARING RESTRICTIONS: {Yes ***/No:24003}  FALLS: Has patient fallen in last 6 months? {fallsyesno:27318}  LIVING ENVIRONMENT: Lives with: {OPRC lives with:25569::"lives with their family"} Lives in: {Lives in:25570} Stairs: {opstairs:27293} Has following equipment at home: {Assistive devices:23999}  PLOF: {PLOF:24004}  PATIENT GOALS: ***  OBJECTIVE:  Note: Objective measures were completed at Evaluation unless otherwise noted.  DIAGNOSTIC FINDINGS: ***  COGNITION: Overall cognitive status: {cognition:24006}   SENSATION: {sensation:27233}  COORDINATION: ***  EDEMA:  {edema:24020}  MUSCLE TONE: {LE tone:25568}  MUSCLE LENGTH: Hamstrings: Right *** deg;  Left *** deg Maisie Fus test: Right *** deg; Left *** deg  DTRs:  {DTR SITE:24025}  POSTURE: {posture:25561}  LOWER EXTREMITY ROM:     {AROM/PROM:27142}  Right Eval Left Eval  Hip flexion    Hip extension    Hip abduction    Hip adduction    Hip internal rotation    Hip external rotation    Knee flexion    Knee extension    Ankle dorsiflexion    Ankle plantarflexion    Ankle inversion    Ankle eversion     (Blank rows = not tested)  LOWER EXTREMITY MMT:    MMT Right Eval Left Eval  Hip flexion    Hip extension    Hip abduction    Hip adduction    Hip internal rotation    Hip external rotation    Knee flexion    Knee extension    Ankle dorsiflexion    Ankle plantarflexion    Ankle inversion    Ankle eversion    (Blank rows = not tested)  BED MOBILITY:  {Bed mobility:24027}  TRANSFERS: Assistive device utilized: {Assistive devices:23999}  Sit to stand: {Levels of assistance:24026} Stand to sit: {Levels of assistance:24026} Chair to chair: {Levels of assistance:24026} Floor: {Levels of assistance:24026}  RAMP:  Level of Assistance: {Levels of assistance:24026} Assistive device utilized: {Assistive devices:23999} Ramp Comments: ***  CURB:  Level of Assistance: {Levels of assistance:24026} Assistive device utilized: {Assistive devices:23999} Curb Comments: ***  STAIRS: Level of Assistance: {Levels of assistance:24026} Stair Negotiation Technique: {Stair Technique:27161} with {Rail Assistance:27162} Number of Stairs: ***  Height of Stairs: ***  Comments: ***  GAIT: Gait pattern: {gait characteristics:25376} Distance walked: *** Assistive device utilized: {Assistive devices:23999} Level of assistance: {Levels of assistance:24026} Comments: ***  FUNCTIONAL TESTS:  {Functional tests:24029}  PATIENT SURVEYS:  {rehab surveys:24030}                                                                                                                               TREATMENT DATE: ***    PATIENT EDUCATION: Education details: *** Person educated: {Person educated:25204} Education method: {Education Method:25205} Education comprehension: {Education Comprehension:25206}  HOME EXERCISE PROGRAM: ***  GOALS: Goals reviewed with patient? {yes/no:20286}  SHORT TERM GOALS: Target date: ***  *** Baseline: Goal status: INITIAL  2.  *** Baseline:  Goal status: INITIAL  3.  *** Baseline:  Goal status: INITIAL  4.  *** Baseline:  Goal status: INITIAL  5.  *** Baseline:  Goal status: INITIAL  6.  *** Baseline:  Goal status: INITIAL  LONG TERM GOALS: Target date: ***  *** Baseline:  Goal status: INITIAL  2.  *** Baseline:  Goal status: INITIAL  3.  *** Baseline:  Goal status: INITIAL  4.  *** Baseline:  Goal status: INITIAL  5.  *** Baseline:  Goal status: INITIAL  6.  *** Baseline:  Goal status: INITIAL  ASSESSMENT:  CLINICAL IMPRESSION: Patient is a *** y.o. *** who was seen today for physical therapy evaluation and treatment for ***.   OBJECTIVE IMPAIRMENTS: {opptimpairments:25111}.   ACTIVITY LIMITATIONS: {activitylimitations:27494}  PARTICIPATION LIMITATIONS: {participationrestrictions:25113}  PERSONAL FACTORS: {Personal factors:25162} are also affecting patient's functional outcome.   REHAB POTENTIAL: {rehabpotential:25112}  CLINICAL DECISION MAKING: {clinical decision making:25114}  EVALUATION COMPLEXITY: {Evaluation complexity:25115}  PLAN:  PT FREQUENCY: {rehab frequency:25116}  PT DURATION: {rehab duration:25117}  PLANNED INTERVENTIONS: {rehab planned interventions:25118::"97110-Therapeutic exercises","97530- Therapeutic [phone]- Neuromuscular re-education","97535- Self JXBJ","47829- Manual therapy"}  PLAN FOR NEXT SESSION: ***   Norman Herrlich, PT 08/09/2023, 10:57 AM

## 2023-08-10 ENCOUNTER — Ambulatory Visit: Payer: Medicaid Other | Attending: Student | Admitting: Physical Therapy

## 2023-08-23 ENCOUNTER — Ambulatory Visit: Payer: Medicaid Other | Admitting: Physical Therapy

## 2023-08-30 ENCOUNTER — Ambulatory Visit: Payer: Medicaid Other | Admitting: Physical Therapy

## 2023-09-05 ENCOUNTER — Ambulatory Visit: Payer: Medicaid Other | Admitting: Physical Therapy

## 2023-09-13 ENCOUNTER — Ambulatory Visit: Payer: Medicaid Other | Admitting: Physical Therapy

## 2023-09-20 ENCOUNTER — Ambulatory Visit: Payer: Medicaid Other | Admitting: Physical Therapy

## 2023-09-29 ENCOUNTER — Ambulatory Visit: Payer: Medicaid Other | Admitting: Physical Therapy

## 2023-10-04 ENCOUNTER — Ambulatory Visit: Payer: Medicaid Other | Admitting: Physical Therapy

## 2023-10-11 ENCOUNTER — Ambulatory Visit: Payer: Medicaid Other | Admitting: Physical Therapy

## 2023-10-18 ENCOUNTER — Ambulatory Visit: Payer: Medicaid Other | Admitting: Physical Therapy

## 2023-10-21 ENCOUNTER — Other Ambulatory Visit: Payer: Self-pay | Admitting: Specialist

## 2023-10-21 DIAGNOSIS — F17210 Nicotine dependence, cigarettes, uncomplicated: Secondary | ICD-10-CM

## 2023-10-21 DIAGNOSIS — R0602 Shortness of breath: Secondary | ICD-10-CM

## 2023-10-25 ENCOUNTER — Ambulatory Visit: Payer: Medicaid Other | Admitting: Physical Therapy

## 2023-11-01 ENCOUNTER — Ambulatory Visit: Payer: Medicaid Other | Admitting: Physical Therapy

## 2023-11-01 ENCOUNTER — Ambulatory Visit: Admission: RE | Admit: 2023-11-01 | Source: Ambulatory Visit

## 2023-11-08 ENCOUNTER — Ambulatory Visit: Payer: Medicaid Other | Admitting: Physical Therapy

## 2023-11-15 ENCOUNTER — Ambulatory Visit: Payer: Medicaid Other | Admitting: Physical Therapy

## 2023-11-22 ENCOUNTER — Ambulatory Visit: Payer: Medicaid Other | Admitting: Physical Therapy

## 2024-03-20 ENCOUNTER — Telehealth: Payer: Self-pay | Admitting: Cardiology

## 2024-03-20 NOTE — Telephone Encounter (Signed)
 Called to confirm/remind patient of [facility] on 03[facility]21[facility].   Appointment:   [] Confirmed  [] Left mess   [] No answer/No voice mail  [] VM Full/unable to leave message  [x] Phone not in service  Patient reminded to bring all medications and/or complete list.  Confirmed patient has transportation. Gave directions, instructed to utilize valet parking.

## 2024-03-21 ENCOUNTER — Encounter: Admitting: Cardiology

## 2024-04-19 NOTE — Progress Notes (Signed)
 Established George Arellano Visit   Chief Complaint: Chief Complaint  George Arellano presents with   Shortness of Breath    FU ECHO   Date of Service: 04/19/2024 Date of Birth: [birth year] PCP: Grayce Flakes  History of Present Illness: George Arellano is a 58 y.o.male George Arellano returns for   1.  Posterior cerebral artery stroke  2.  Essential hypertension  3.  Status post Linq 2019 (battery expired) 4.  COPD/ongoing tobacco abuse  5.  EtOH abuse   George Arellano was recently referred back by his primary care provider for dizziness and low blood pressures. George Arellano states that George Arellano had a low blood pressure reading about 2 months prior, and has been checking his blood pressure daily ever since. George Arellano has had positional dizziness. Review of recent blood pressure recordings show 96/80, 118/68, and 104/65. His blood pressure medications were all discontinued, and George Arellano is now only on Lasix  daily. George Arellano was also prescribed midodrine  by his primary care provider, which George Arellano takes once daily.   George Arellano returns today for a follow-up and to review results of echocardiogram and Lexiscan Myoview.  Echocardiogram on 04/05/2024 revealed normal left ventricular function with LVEF greater than 55% with trivial valvular insufficiencies.  George Arellano underwent Lexiscan Myoview on 04/18/2024.  Gated scintigraphy revealed LVEF 50%.  SPECT imaging revealed a small fixed apical wall defect of mild intensity consistent with mild apical scar without significant ischemia. George Arellano reports occasional dizziness, but it has improved. George Arellano takes midodrine  once daily. George Arellano denies chest pain. George Arellano has chronic exertional dyspnea, which is unchanged. George Arellano denies any recent peripheral edema. George Arellano denies presyncope or syncope. George Arellano drinks a 6 pack of beer daily and smokes a pack to a pack and a half daily. George Arellano states that George Arellano does not drink much water , and mostly eats microwave meals.    72-hour Holter monitor from 04/08/2022 - 04/11/2022, revealed predominant sinus rhythm at 76 bpm, sinus  heart rate range 58 to 107 bpm. Rare premature atrial contractions were present.  There were no diary entries.    Past Medical and Surgical History  Past Medical History Past Medical History:  Diagnosis Date   Alcohol abuse    Asthma without status asthmaticus (HHS-HCC)    Atrial fibrillation (CMS/HHS-HCC)    CHF (congestive heart failure) (CMS/HHS-HCC)    COPD (chronic obstructive pulmonary disease) (CMS/HHS-HCC)    Depression    Heart attack (CMS/HHS-HCC) 2002   2/2 crack    Hypertension    Smoking    Stroke (CMS/HHS-HCC)    Vision abnormalities     Past Surgical History George Arellano has a past surgical history that includes heart monitor (09/2017) and hand surgery (Left, 2002).   Medications and Allergies  Current Medications  Current Outpatient Medications  Medication Sig Dispense Refill   albuterol  90 mcg/actuation inhaler Inhale 2 inhalations into George lungs every 6 (six) hours as needed for Wheezing 1 each 11   aspirin  81 MG EC tablet Take 81 mg by mouth once daily     ATROVENT  HFA inhaler Take 2 inhalations by mouth 2 (two) times daily as needed     budesonide -formoteroL  (SYMBICORT ) 160-4.5 mcg/actuation inhaler Inhale 2 inhalations into George lungs 2 (two) times daily 10.2 g 5   busPIRone  (BUSPAR ) 10 MG tablet Take 1 tablet (10 mg total) by mouth 3 (three) times daily (George Arellano taking differently: Take 20 mg by mouth 3 (three) times daily) 90 tablet 11   cholecalciferol (VITAMIN D3) 400 unit tablet Take 800 Units by mouth once  daily     cyanocobalamin  (VITAMIN B12) 1000 MCG tablet Take 1,000 mcg by mouth once daily (George Arellano not taking: Reported on 04/04/2024)     diphenhydrAMINE  (BENADRYL ) 25 mg tablet Take 25 mg by mouth every 4 (four) hours as needed for Itching, Allergies or Sleep     ezetimibe (ZETIA) 10 mg tablet Take 10 mg by mouth once daily     fluticasone  propionate (FLONASE) 50 mcg/actuation nasal spray Place 2 sprays into both nostrils once daily  (George Arellano not taking: Reported on 04/04/2024)     FUROsemide  (LASIX ) 20 MG tablet      gabapentin  (NEURONTIN ) 300 MG capsule Take 1 capsule (300 mg total) by mouth 2 (two) times daily for 360 days 180 capsule 3   ipratropium-albuterol  (DUO-NEB) nebulizer solution Take 3 mLs by nebulization 4 (four) times daily.     KLOR-CON  M20 20 mEq ER tablet TAKE 1 TABLET BY MOUTH ONCE DAILY 90 tablet 3   lamoTRIgine  (LAMICTAL ) 150 MG tablet Take 1 tablet (150 mg total) by mouth every morning (With a 200 mg tablet at night) 90 tablet 3   lamoTRIgine  (LAMICTAL ) 200 MG tablet Take 1 tablet (200 mg total) by mouth at bedtime (With 150 mg tablet in George morning) 90 tablet 3   midodrine  (PROAMATINE ) 2.5 MG tablet Take 2.5 mg by mouth 3 (three) times daily     multivit-minerals/ferrous fum (MULTI VITAMIN ORAL) Take by mouth     VENTOLIN  HFA 90 mcg/actuation inhaler Inhale 2 inhalations into George lungs every 6 (six) hours as needed for Wheezing 18 g 11   No current facility-administered medications for this visit.    Allergies: Atorvastatin  and Levofloxacin  Social and Family History  Social History  reports that George Arellano has been smoking cigarettes. George Arellano has a 40 pack-year smoking history. George Arellano has never used smokeless tobacco. George Arellano reports current alcohol use of about 6.0 standard drinks of alcohol per week. George Arellano reports current drug use. Frequency: 2.00 times per week. Drugs: Marijuana and Crack cocaine.  Family History Family History  Problem Relation Name Age of Onset   Coronary Artery Disease (Blocked arteries around heart) Mother     Diabetes Mother     Stroke Father     Parkinsonism Father     Stroke Sister     Multiple sclerosis Sister     Migraines Sister     Myocardial Infarction (Heart attack) Sister     Alzheimer's disease Maternal Grandmother     Alzheimer's disease Maternal Grandfather      Review of Systems   Review of Systems: George Arellano denies chest pain, with chronic shortness  of breath, orthopnea, paroxysmal nocturnal dyspnea, pedal edema, palpitations, heart racing, presyncope, syncope, with dizziness, with gait instability, with left upper and lower extremity paresthesias. Review of 8 Systems is negative except as described above.  Physical Examination   Vitals:BP 138/78   Pulse 88   Ht 172.7 cm (5' 8)   Wt 86.2 kg (190 lb)   BMI 28.89 kg/m  Ht:172.7 cm (5' 8) Wt:86.2 kg (190 lb) ADJ:Anib surface area is 2.03 meters squared. Body mass index is 28.89 kg/m.  General: Alert and oriented. Well-appearing. No acute distress. HEENT: Pupils equally reactive to light and accomodation    Neck: no JVD Lungs: Normal effort of breathing; clear to auscultation bilaterally; no wheezes, rales, rhonchi Heart: Regular rate and rhythm. No murmur, rub, or gallop Abdomen: soft nontender, nondistended Extremities: no cyanosis, clubbing, or edema Peripheral Pulses: 2+ radial Skin: Warm,  dry, no diaphoresis   Assessment   58 y.o. male with  1. Primary hypertension   2. COPD, severe (CMS/HHS-HCC)   3. SOB (shortness of breath) on exertion   4. Alcoholism (CMS/HHS-HCC)   5. Tobacco abuse   6. Dizziness on standing    58 year old male with history of posterior cerebral artery stroke, status post Linq implantation in 2019 (battery expired).  George Arellano has essential hypertension though blood pressures have been low and George Arellano is now only taking furosemide , and was recently prescribed midodrine . Prevoius 72 hr Holter monitor revealed predominant sinus rhythm with mean heart rate 76 bpm with heart rate range from 58 to 107 bpm. There were no arrhythmias. George Arellano denies recurrence of palpitations.  George Arellano has persistent peripheral edema, stable on furosemide  20 mg daily. Due to his new hypotension, dizziness, exertional dyspnea, and multiple cardiovascular risk factors, George Arellano underwent Lexiscan Myoview, which showed small apical scar without significant ischemia.  2D echocardiogram  revealed normal left ventricular function with trivial valvular insufficiencies.  George Arellano reports that his dizziness has improved.  His blood pressure is well-controlled today.  Plan   Continue current medications Advised George Arellano that George Arellano may take midodrine  up to 3 times daily, checking blood pressure first, and taking if SBP less than 110, and not taking an hour before going to bed. Strongly recommendation alcohol and tobacco cessation Recommend increasing fluid intake and improving diet Return to clinic in 4 months.   No orders of George defined types were placed in this encounter.   Return in about 4 months (around 08/20/2024).   Attestation Statement:   I personally performed George service, non-incident to. (WP)   ANNA DRANE, PA-C

## 2024-07-25 ENCOUNTER — Encounter: Payer: Self-pay | Admitting: Emergency Medicine

## 2024-07-25 ENCOUNTER — Emergency Department

## 2024-07-25 ENCOUNTER — Other Ambulatory Visit: Payer: Self-pay

## 2024-07-25 ENCOUNTER — Inpatient Hospital Stay
Admission: EM | Admit: 2024-07-25 | Discharge: 2024-07-31 | DRG: 897 | Disposition: A | Discharge status: Home Health | Attending: Internal Medicine | Admitting: Internal Medicine

## 2024-07-25 DIAGNOSIS — Z79899 Other long term (current) drug therapy: Secondary | ICD-10-CM

## 2024-07-25 DIAGNOSIS — J441 Chronic obstructive pulmonary disease with (acute) exacerbation: Principal | ICD-10-CM | POA: Diagnosis present

## 2024-07-25 DIAGNOSIS — R7989 Other specified abnormal findings of blood chemistry: Secondary | ICD-10-CM | POA: Diagnosis present

## 2024-07-25 DIAGNOSIS — I10 Essential (primary) hypertension: Secondary | ICD-10-CM | POA: Diagnosis present

## 2024-07-25 DIAGNOSIS — F10231 Alcohol dependence with withdrawal delirium: Principal | ICD-10-CM | POA: Diagnosis present

## 2024-07-25 DIAGNOSIS — J432 Centrilobular emphysema: Secondary | ICD-10-CM | POA: Diagnosis present

## 2024-07-25 DIAGNOSIS — I110 Hypertensive heart disease with heart failure: Secondary | ICD-10-CM | POA: Diagnosis present

## 2024-07-25 DIAGNOSIS — F17200 Nicotine dependence, unspecified, uncomplicated: Secondary | ICD-10-CM | POA: Diagnosis present

## 2024-07-25 DIAGNOSIS — Z8249 Family history of ischemic heart disease and other diseases of the circulatory system: Secondary | ICD-10-CM

## 2024-07-25 DIAGNOSIS — G40909 Epilepsy, unspecified, not intractable, without status epilepticus: Secondary | ICD-10-CM | POA: Diagnosis present

## 2024-07-25 DIAGNOSIS — I69354 Hemiplegia and hemiparesis following cerebral infarction affecting left non-dominant side: Secondary | ICD-10-CM

## 2024-07-25 DIAGNOSIS — I69359 Hemiplegia and hemiparesis following cerebral infarction affecting unspecified side: Secondary | ICD-10-CM

## 2024-07-25 DIAGNOSIS — Z7982 Long term (current) use of aspirin: Secondary | ICD-10-CM

## 2024-07-25 DIAGNOSIS — F1090 Alcohol use, unspecified, uncomplicated: Secondary | ICD-10-CM | POA: Diagnosis present

## 2024-07-25 DIAGNOSIS — I2489 Other forms of acute ischemic heart disease: Secondary | ICD-10-CM | POA: Diagnosis present

## 2024-07-25 DIAGNOSIS — J438 Other emphysema: Secondary | ICD-10-CM | POA: Diagnosis present

## 2024-07-25 DIAGNOSIS — F17210 Nicotine dependence, cigarettes, uncomplicated: Secondary | ICD-10-CM | POA: Diagnosis present

## 2024-07-25 DIAGNOSIS — I4891 Unspecified atrial fibrillation: Secondary | ICD-10-CM | POA: Diagnosis present

## 2024-07-25 DIAGNOSIS — F32A Depression, unspecified: Secondary | ICD-10-CM | POA: Diagnosis present

## 2024-07-25 DIAGNOSIS — D6959 Other secondary thrombocytopenia: Secondary | ICD-10-CM | POA: Diagnosis present

## 2024-07-25 DIAGNOSIS — J9611 Chronic respiratory failure with hypoxia: Secondary | ICD-10-CM | POA: Diagnosis present

## 2024-07-25 DIAGNOSIS — E7800 Pure hypercholesterolemia, unspecified: Secondary | ICD-10-CM | POA: Diagnosis present

## 2024-07-25 DIAGNOSIS — I499 Cardiac arrhythmia, unspecified: Secondary | ICD-10-CM | POA: Diagnosis present

## 2024-07-25 DIAGNOSIS — F419 Anxiety disorder, unspecified: Secondary | ICD-10-CM | POA: Diagnosis present

## 2024-07-25 DIAGNOSIS — Z7951 Long term (current) use of inhaled steroids: Secondary | ICD-10-CM

## 2024-07-25 LAB — BASIC METABOLIC PANEL WITH GFR
Anion gap: 15 (ref 5–15)
BUN: 8 mg/dL (ref 6–20)
CO2: 27 mmol/L (ref 22–32)
Calcium: 9.5 mg/dL (ref 8.9–10.3)
Chloride: 103 mmol/L (ref 98–111)
Creatinine, Ser: 0.78 mg/dL (ref 0.61–1.24)
GFR, Estimated: >60 mL/min
Glucose, Bld: 115 mg/dL — ABNORMAL HIGH (ref 70–99)
Potassium: 3.5 mmol/L (ref 3.5–5.1)
Sodium: 144 mmol/L (ref 135–145)

## 2024-07-25 LAB — CBC WITH DIFFERENTIAL/PLATELET
Abs Immature Granulocytes: 0.03 K/uL (ref 0.00–0.07)
Basophils Absolute: 0 K/uL (ref 0.0–0.1)
Basophils Relative: 1 %
Eosinophils Absolute: 0.1 K/uL (ref 0.0–0.5)
Eosinophils Relative: 2 %
HCT: 48.8 % (ref 39.0–52.0)
Hemoglobin: 16.6 g/dL (ref 13.0–17.0)
Immature Granulocytes: 1 %
Lymphocytes Relative: 24 %
Lymphs Abs: 1.5 K/uL (ref 0.7–4.0)
MCH: 36.1 pg — ABNORMAL HIGH (ref 26.0–34.0)
MCHC: 34 g/dL (ref 30.0–36.0)
MCV: 106.1 fL — ABNORMAL HIGH (ref 80.0–100.0)
Monocytes Absolute: 0.5 K/uL (ref 0.1–1.0)
Monocytes Relative: 7 %
Neutro Abs: 4.2 K/uL (ref 1.7–7.7)
Neutrophils Relative %: 65 %
Platelets: 104 K/uL — ABNORMAL LOW (ref 150–400)
RBC: 4.6 MIL/uL (ref 4.22–5.81)
RDW: 12.7 % (ref 11.5–15.5)
WBC: 6.3 K/uL (ref 4.0–10.5)
nRBC: 0 % (ref 0.0–0.2)

## 2024-07-25 LAB — MAGNESIUM: Magnesium: 2.1 mg/dL (ref 1.7–2.4)

## 2024-07-25 LAB — PHOSPHORUS: Phosphorus: 1.5 mg/dL — ABNORMAL LOW (ref 2.5–4.6)

## 2024-07-25 LAB — TROPONIN T, HIGH SENSITIVITY
Troponin T High Sensitivity: 38 ng/L — ABNORMAL HIGH (ref 0–19)
Troponin T High Sensitivity: 72 ng/L — ABNORMAL HIGH (ref 0–19)
Troponin T High Sensitivity: 35 ng/L — ABNORMAL HIGH (ref 0–19)
Troponin T High Sensitivity: 33 ng/L — ABNORMAL HIGH (ref 0–19)

## 2024-07-25 LAB — RESP PANEL BY RT-PCR (RSV, FLU A&B, COVID)  RVPGX2
Influenza A by PCR: NEGATIVE
Influenza B by PCR: NEGATIVE
Resp Syncytial Virus by PCR: NEGATIVE
SARS Coronavirus 2 by RT PCR: NEGATIVE

## 2024-07-25 LAB — PRO BRAIN NATRIURETIC PEPTIDE: Pro Brain Natriuretic Peptide: 143 pg/mL

## 2024-07-25 MED ORDER — LORAZEPAM 1 MG PO TABS
1.0000 mg | ORAL_TABLET | ORAL | Status: AC | PRN
  Administered 2024-07-26: 2 mg via ORAL
  Filled 2024-07-25: qty 2

## 2024-07-25 MED ORDER — GABAPENTIN 300 MG PO CAPS
300.0000 mg | ORAL_CAPSULE | Freq: Two times a day (BID) | ORAL | Status: DC
  Administered 2024-07-25 – 2024-07-31 (×12): 300 mg via ORAL
  Filled 2024-07-25 (×12): qty 1

## 2024-07-25 MED ORDER — ENOXAPARIN SODIUM 40 MG/0.4ML IJ SOSY
40.0000 mg | PREFILLED_SYRINGE | INTRAMUSCULAR | Status: DC
  Administered 2024-07-25 – 2024-07-30 (×6): 40 mg via SUBCUTANEOUS
  Filled 2024-07-25 (×6): qty 0.4

## 2024-07-25 MED ORDER — FUROSEMIDE 20 MG PO TABS
20.0000 mg | ORAL_TABLET | Freq: Every day | ORAL | Status: DC
  Administered 2024-07-26 – 2024-07-31 (×6): 20 mg via ORAL
  Filled 2024-07-25 (×6): qty 1

## 2024-07-25 MED ORDER — ALBUTEROL SULFATE (2.5 MG/3ML) 0.083% IN NEBU
2.5000 mg | INHALATION_SOLUTION | RESPIRATORY_TRACT | Status: DC | PRN
  Administered 2024-07-27 – 2024-07-30 (×3): 2.5 mg via RESPIRATORY_TRACT
  Filled 2024-07-25 (×6): qty 3

## 2024-07-25 MED ORDER — ONDANSETRON HCL 4 MG PO TABS: 4.0000 mg | ORAL_TABLET | Freq: Four times a day (QID) | ORAL | Status: DC | PRN

## 2024-07-25 MED ORDER — ADULT MULTIVITAMIN W/MINERALS CH
1.0000 | ORAL_TABLET | Freq: Every day | ORAL | Status: DC
  Administered 2024-07-26 – 2024-07-31 (×6): 1 via ORAL
  Filled 2024-07-25 (×6): qty 1

## 2024-07-25 MED ORDER — LAMOTRIGINE 100 MG PO TABS
200.0000 mg | ORAL_TABLET | Freq: Every day | ORAL | Status: DC
  Administered 2024-07-25 – 2024-07-30 (×6): 200 mg via ORAL
  Filled 2024-07-25 (×6): qty 2

## 2024-07-25 MED ORDER — THIAMINE HCL 100 MG/ML IJ SOLN
100.0000 mg | Freq: Every day | INTRAMUSCULAR | Status: DC
  Administered 2024-07-28: 100 mg via INTRAVENOUS
  Filled 2024-07-25 (×5): qty 2

## 2024-07-25 MED ORDER — FOLIC ACID 1 MG PO TABS
1.0000 mg | ORAL_TABLET | Freq: Every day | ORAL | Status: DC
  Administered 2024-07-25 – 2024-07-31 (×7): 1 mg via ORAL
  Filled 2024-07-25 (×7): qty 1

## 2024-07-25 MED ORDER — SODIUM CHLORIDE 0.45 % IV SOLN: INTRAVENOUS | Status: AC

## 2024-07-25 MED ORDER — THIAMINE MONONITRATE 100 MG PO TABS
100.0000 mg | ORAL_TABLET | Freq: Every day | ORAL | Status: DC
  Administered 2024-07-25 – 2024-07-31 (×6): 100 mg via ORAL
  Filled 2024-07-25 (×7): qty 1

## 2024-07-25 MED ORDER — VITAMIN D (ERGOCALCIFEROL) 1.25 MG (50000 UNIT) PO CAPS
50000.0000 [IU] | ORAL_CAPSULE | ORAL | Status: DC
  Administered 2024-07-25: 50000 [IU] via ORAL
  Filled 2024-07-25 (×2): qty 1

## 2024-07-25 MED ORDER — LORAZEPAM 1 MG PO TABS
0.0000 mg | ORAL_TABLET | Freq: Four times a day (QID) | ORAL | Status: AC
  Administered 2024-07-26 – 2024-07-27 (×4): 1 mg via ORAL
  Filled 2024-07-25: qty 4
  Filled 2024-07-25 (×4): qty 1

## 2024-07-25 MED ORDER — PREDNISONE 20 MG PO TABS
40.0000 mg | ORAL_TABLET | Freq: Every day | ORAL | Status: AC
  Administered 2024-07-26 – 2024-07-30 (×5): 40 mg via ORAL
  Filled 2024-07-25 (×5): qty 2

## 2024-07-25 MED ORDER — IOHEXOL 350 MG/ML SOLN
75.0000 mL | Freq: Once | INTRAVENOUS | Status: AC | PRN
  Administered 2024-07-25: 75 mL via INTRAVENOUS

## 2024-07-25 MED ORDER — MAGNESIUM SULFATE 2 GM/50ML IV SOLN
2.0000 g | INTRAVENOUS | Status: AC
  Administered 2024-07-25: 2 g via INTRAVENOUS
  Filled 2024-07-25: qty 50

## 2024-07-25 MED ORDER — FLUTICASONE FUROATE-VILANTEROL 100-25 MCG/ACT IN AEPB: 1.0000 | INHALATION_SPRAY | Freq: Every day | RESPIRATORY_TRACT | Status: DC

## 2024-07-25 MED ORDER — METOPROLOL TARTRATE 25 MG PO TABS: 50.0000 mg | ORAL_TABLET | Freq: Two times a day (BID) | ORAL | Status: DC

## 2024-07-25 MED ORDER — ASPIRIN 81 MG PO TBEC
81.0000 mg | DELAYED_RELEASE_TABLET | Freq: Every day | ORAL | Status: DC
  Administered 2024-07-25 – 2024-07-31 (×7): 81 mg via ORAL
  Filled 2024-07-25 (×7): qty 1

## 2024-07-25 MED ORDER — BUDESONIDE 0.5 MG/2ML IN SUSP
2.0000 mg | Freq: Two times a day (BID) | RESPIRATORY_TRACT | Status: DC
  Administered 2024-07-25: 2 mg via RESPIRATORY_TRACT
  Filled 2024-07-25 (×2): qty 8

## 2024-07-25 MED ORDER — SODIUM CHLORIDE 0.9 % IV SOLN
2.0000 g | Freq: Once | INTRAVENOUS | Status: AC
  Administered 2024-07-25: 2 g via INTRAVENOUS
  Filled 2024-07-25: qty 20

## 2024-07-25 MED ORDER — ACETAMINOPHEN 650 MG RE SUPP: 650.0000 mg | Freq: Four times a day (QID) | RECTAL | Status: DC | PRN

## 2024-07-25 MED ORDER — BUSPIRONE HCL 10 MG PO TABS
10.0000 mg | ORAL_TABLET | Freq: Three times a day (TID) | ORAL | Status: DC
  Administered 2024-07-25 – 2024-07-31 (×18): 10 mg via ORAL
  Filled 2024-07-25 (×3): qty 1
  Filled 2024-07-25: qty 2
  Filled 2024-07-25 (×14): qty 1

## 2024-07-25 MED ORDER — ONDANSETRON HCL 4 MG/2ML IJ SOLN: 4.0000 mg | Freq: Four times a day (QID) | INTRAMUSCULAR | Status: DC | PRN

## 2024-07-25 MED ORDER — ALBUTEROL SULFATE (2.5 MG/3ML) 0.083% IN NEBU
5.0000 mg | INHALATION_SOLUTION | Freq: Once | RESPIRATORY_TRACT | Status: AC
  Administered 2024-07-25: 5 mg via RESPIRATORY_TRACT
  Filled 2024-07-25: qty 6

## 2024-07-25 MED ORDER — LORAZEPAM 1 MG PO TABS
0.0000 mg | ORAL_TABLET | Freq: Two times a day (BID) | ORAL | Status: AC
  Administered 2024-07-27: 3 mg via ORAL
  Administered 2024-07-28: 2 mg via ORAL
  Filled 2024-07-25: qty 3
  Filled 2024-07-25: qty 2

## 2024-07-25 MED ORDER — LOSARTAN POTASSIUM 50 MG PO TABS: 25.0000 mg | ORAL_TABLET | Freq: Every day | ORAL | Status: DC

## 2024-07-25 MED ORDER — SODIUM CHLORIDE 0.9 % IV SOLN
100.0000 mg | Freq: Once | INTRAVENOUS | Status: AC
  Administered 2024-07-25: 100 mg via INTRAVENOUS
  Filled 2024-07-25: qty 100

## 2024-07-25 MED ORDER — LORAZEPAM 2 MG/ML IJ SOLN
1.0000 mg | INTRAMUSCULAR | Status: AC | PRN
  Administered 2024-07-27: 4 mg via INTRAVENOUS
  Administered 2024-07-27: 2 mg via INTRAVENOUS
  Administered 2024-07-27: 3 mg via INTRAVENOUS
  Administered 2024-07-27 – 2024-07-28 (×3): 2 mg via INTRAVENOUS
  Filled 2024-07-25 (×2): qty 2
  Filled 2024-07-25 (×2): qty 1
  Filled 2024-07-25: qty 2
  Filled 2024-07-25: qty 1

## 2024-07-25 MED ORDER — HYDROCHLOROTHIAZIDE 12.5 MG PO TABS: 12.5000 mg | ORAL_TABLET | Freq: Every day | ORAL | Status: DC

## 2024-07-25 MED ORDER — ACETAMINOPHEN 325 MG PO TABS
650.0000 mg | ORAL_TABLET | Freq: Four times a day (QID) | ORAL | Status: DC | PRN
  Administered 2024-07-29: 650 mg via ORAL
  Filled 2024-07-25: qty 2

## 2024-07-25 MED ORDER — MELATONIN 5 MG PO TABS
2.5000 mg | ORAL_TABLET | Freq: Every day | ORAL | Status: DC
  Administered 2024-07-25 – 2024-07-30 (×6): 2.5 mg via ORAL
  Filled 2024-07-25 (×6): qty 1

## 2024-07-25 MED ORDER — IPRATROPIUM-ALBUTEROL 0.5-2.5 (3) MG/3ML IN SOLN
3.0000 mL | Freq: Four times a day (QID) | RESPIRATORY_TRACT | Status: DC
  Administered 2024-07-25 – 2024-07-26 (×2): 3 mL via RESPIRATORY_TRACT
  Filled 2024-07-25 (×2): qty 3

## 2024-07-25 MED ORDER — DIPHENHYDRAMINE HCL 50 MG/ML IJ SOLN
50.0000 mg | INTRAMUSCULAR | Status: AC
  Administered 2024-07-25: 50 mg via INTRAVENOUS
  Filled 2024-07-25: qty 1

## 2024-07-25 MED ORDER — LAMOTRIGINE 25 MG PO TABS
150.0000 mg | ORAL_TABLET | Freq: Every day | ORAL | Status: DC
  Administered 2024-07-26 – 2024-07-31 (×6): 150 mg via ORAL
  Filled 2024-07-25 (×6): qty 2

## 2024-07-25 MED ORDER — HYDRALAZINE HCL 20 MG/ML IJ SOLN
10.0000 mg | Freq: Once | INTRAMUSCULAR | Status: DC
  Filled 2024-07-25: qty 1

## 2024-07-25 MED ORDER — POTASSIUM CHLORIDE CRYS ER 10 MEQ PO TBCR
10.0000 meq | EXTENDED_RELEASE_TABLET | Freq: Two times a day (BID) | ORAL | Status: DC
  Administered 2024-07-25 – 2024-07-26 (×2): 10 meq via ORAL
  Filled 2024-07-25 (×2): qty 1

## 2024-07-25 NOTE — ED Notes (Signed)
 Pt transported to CT ?

## 2024-07-25 NOTE — Assessment & Plan Note (Addendum)
 Blood pressure was elevated upon arrival and most likely related to respiratory distress and anxiety At baseline patient, patient has relative hypotension and is prescribed midodrine  for systolic blood pressure less than 110 Monitor blood pressure closely

## 2024-07-25 NOTE — Assessment & Plan Note (Signed)
 History of prior CVA Continue aspirin  and statins

## 2024-07-25 NOTE — Assessment & Plan Note (Signed)
 Patient with a history of COPD with chronic respiratory failure on 3 L of oxygen at bedtime and continued nicotine  use who presents for evaluation of worsening shortness of breath from his baseline associated with diffuse wheezes and rhonchi. Will place patient on inhaled and systemic steroids No indication for continued antibiotic therapy at this time Place patient on scheduled and as needed bronchodilator therapy Continue oxygen supplementation to maintain pulse oximetry greater than 92%

## 2024-07-25 NOTE — ED Provider Notes (Signed)
 "  [facility] Provider Note    Event Date/Time   First MD Initiated Contact with Patient 07/25/24 0932     (approximate)   History   Chief Complaint: Shortness of Breath   HPI  Noam Karaffa is a 59 y.o. male with a history of COPD, chronic 3 L nasal cannula oxygen use, hypertension who comes ED complaining of shortness of breath worse this morning.  When he got up to walk to his bathroom at home, he felt extremely out of breath, no chest pain.  No fever.  Shortness of breath this been constant.  Patient was given Solu-Medrol , 3 nebs prior to arrival in the ED by EMS and fire department.  Denies orthopnea.  Denies increased leg swelling      Past Medical History:  Diagnosis Date   Acute cholecystitis with chronic cholecystitis    Alcohol abuse    Anxiety    CHF (congestive heart failure) (HCC)    COPD (chronic obstructive pulmonary disease) (HCC)    Depression    Dyspnea    Dysrhythmia    A Fib   Hypercholesterolemia    Hypertension    Neuromuscular disorder (HCC)    Seizures (HCC) 12/2018   Stroke (HCC) 07/2017   balance issues, left sided deficit   Umbilical hernia without obstruction and without gangrene    Wears dentures    full upper and lower    Current Outpatient Rx   Order #: [id number] Class: Historical Med   Order #: [id number] Class: Historical Med   Order #: [id number] Class: Historical Med   Order #: [id number] Class: Historical Med   Order #: [id number] Class: Historical Med   Order #: [id number] Class: Historical Med   Order #: [id number] Class: Historical Med   Order #: [id number] Class: Historical Med   Order #: [id number] Class: Normal   Order #: [id number] Class: Historical Med   Order #: [id number] Class: Historical Med   Order #: [id number] Class: Historical Med   Order #: [id number] Class: Historical Med   Order #: [id number] Class: Historical Med   Order #: [id number] Class: Historical Med   Order #: [id number] Class: Historical Med   Order #:  [id number] Class: Historical Med    Past Surgical History:  Procedure Laterality Date   CATARACT EXTRACTION W/PHACO Left 08/09/2019   Procedure: CATARACT EXTRACTION PHACO AND INTRAOCULAR LENS PLACEMENT (IOC) LEFT 3.75  00:40.6  9.2%;  Surgeon: Ferol Rogue, MD;  Location: [facility];  Service: Ophthalmology;  Laterality: Left;   CATARACT EXTRACTION W/PHACO Right 06/10/2020   Procedure: CATARACT EXTRACTION PHACO AND INTRAOCULAR LENS PLACEMENT (IOC) RIGHT;  Surgeon: Jaye Fallow, MD;  Location: [facility];  Service: Ophthalmology;  Laterality: Right;  2.62 0:31.5   CHOLECYSTECTOMY N/A 08/28/2018   Procedure: LAPAROSCOPIC CHOLECYSTECTOMY umbilical hernia repair;  Surgeon: Nicholaus Selinda Birmingham, MD;  Location: [facility];  Service: General;  Laterality: N/A;   LOOP RECORDER INSERTION N/A 09/15/2017   Procedure: LOOP RECORDER INSERTION;  Surgeon: Ammon Blunt, MD;  Location: [facility];  Service: Cardiovascular;  Laterality: N/A;    Physical Exam   Triage Vital Signs: ED Triage Vitals  Encounter Vitals Group     BP 07/25/24 0803 (!) 193/91     Girls Systolic BP Percentile --      Girls Diastolic BP Percentile --      Boys Systolic BP Percentile --      Boys Diastolic BP Percentile --      Pulse Rate 07/25/24 0803 (!) 112     Resp 07/25/24 0803 (!) 24  Temp 07/25/24 0805 98.4 F (36.9 C)     Temp Source 07/25/24 0805 Oral     SpO2 07/25/24 0803 94 %     Weight 07/25/24 0804 189 lb 9.5 oz (86 kg)     Height 07/25/24 0804 5' 9 (1.753 m)     Head Circumference --      Peak Flow --      Pain Score 07/25/24 0803 0     Pain Loc --      Pain Education --      Exclude from Growth Chart --     Most recent vital signs: Vitals:   07/25/24 0805 07/25/24 1207  BP:  105/77  Pulse:  (!) 102  Resp:    Temp: 98.4 F (36.9 C)   SpO2:      General: Awake, no distress.  CV:  Good peripheral perfusion.  Tachycardia heart rate 110 Resp:  Normal effort.  Diffuse  expiratory wheezing.  Markedly prolonged expiratory phase. Abd:  No distention.  Soft nontender Other:  Symmetric calf circumference.  No calf tenderness   ED Results / Procedures / Treatments   Labs (all labs ordered are listed, but only abnormal results are displayed) Labs Reviewed  BASIC METABOLIC PANEL WITH GFR - Abnormal; Notable for the following components:      Result Value   Glucose, Bld 115 (*)    All other components within normal limits  CBC WITH DIFFERENTIAL/PLATELET - Abnormal; Notable for the following components:   MCV 106.1 (*)    MCH 36.1 (*)    Platelets 104 (*)    All other components within normal limits  TROPONIN T, HIGH SENSITIVITY - Abnormal; Notable for the following components:   Troponin T High Sensitivity 38 (*)    All other components within normal limits  TROPONIN T, HIGH SENSITIVITY - Abnormal; Notable for the following components:   Troponin T High Sensitivity 72 (*)    All other components within normal limits  RESP PANEL BY RT-PCR (RSV, FLU A&B, COVID)  RVPGX2  RESPIRATORY PANEL BY PCR  EXPECTORATED SPUTUM ASSESSMENT W GRAM STAIN, RFLX TO RESP C  PRO BRAIN NATRIURETIC PEPTIDE  HIV ANTIBODY (ROUTINE TESTING W REFLEX)     EKG Interpreted by me Sinus tachycardia rate 111.  Right axis, normal intervals.  Poor R wave progression.  No acute ischemic changes.   RADIOLOGY Chest x-ray interpreted by me, appears unremarkable.  Radiology report reviewed  CT chest negative for PE   PROCEDURES:  Procedures   MEDICATIONS ORDERED IN ED: Medications  doxycycline  (VIBRAMYCIN ) 100 mg in sodium chloride  0.9 % 250 mL IVPB (100 mg Intravenous New Bag/Given 07/25/24 1302)  aspirin  EC tablet 81 mg (has no administration in time range)  hydrochlorothiazide  (HYDRODIURIL ) tablet 12.5 mg (has no administration in time range)  losartan  (COZAAR ) tablet 25 mg (has no administration in time range)  metoprolol  tartrate (LOPRESSOR ) tablet 50 mg (has no  administration in time range)  busPIRone  (BUSPAR ) tablet 10 mg (has no administration in time range)  gabapentin  (NEURONTIN ) capsule 300 mg (has no administration in time range)  lamoTRIgine  (LAMICTAL ) tablet 150 mg (has no administration in time range)  lamoTRIgine  (LAMICTAL ) tablet 200 mg (has no administration in time range)  multivitamin with minerals tablet 1 tablet (has no administration in time range)  potassium chloride  SA (KLOR-CON  M) CR tablet 10 mEq (has no administration in time range)  Vitamin D  (Ergocalciferol ) (DRISDOL ) 1.25 MG (50000 UNIT) capsule 50,000 Units (has no administration  in time range)  ipratropium-albuterol  (DUONEB) 0.5-2.5 (3) MG/3ML nebulizer solution 3 mL (has no administration in time range)  predniSONE  (DELTASONE ) tablet 40 mg (has no administration in time range)  enoxaparin  (LOVENOX ) injection 40 mg (has no administration in time range)  0.45 % sodium chloride  infusion ( Intravenous New Bag/Given 07/25/24 1258)  acetaminophen  (TYLENOL ) tablet 650 mg (has no administration in time range)    Or  acetaminophen  (TYLENOL ) suppository 650 mg (has no administration in time range)  ondansetron  (ZOFRAN ) tablet 4 mg (has no administration in time range)    Or  ondansetron  (ZOFRAN ) injection 4 mg (has no administration in time range)  albuterol  (PROVENTIL ) (2.5 MG/3ML) 0.083% nebulizer solution 2.5 mg (has no administration in time range)  budesonide  (PULMICORT ) nebulizer solution 2 mg (has no administration in time range)  magnesium  sulfate IVPB 2 g 50 mL (0 g Intravenous Stopped 07/25/24 1104)  iohexol  (OMNIPAQUE ) 350 MG/ML injection 75 mL (75 mLs Intravenous Contrast Given 07/25/24 1023)  albuterol  (PROVENTIL ) (2.5 MG/3ML) 0.083% nebulizer solution 5 mg (5 mg Nebulization Given 07/25/24 1208)  cefTRIAXone  (ROCEPHIN ) 2 g in sodium chloride  0.9 % 100 mL IVPB (0 g Intravenous Stopped 07/25/24 1217)  diphenhydrAMINE  (BENADRYL ) injection 50 mg (50 mg Intravenous Given  07/25/24 1302)     IMPRESSION / MDM / ASSESSMENT AND PLAN / ED COURSE  I reviewed the triage vital signs and the nursing notes.  DDx: COPD exacerbation, pneumonia, pulmonary edema, pleural effusion, pneumothorax, NSTEMI, PE  Patient's presentation is most consistent with acute presentation with potential threat to life or bodily function.     Clinical Course as of 07/25/24 1340  Wed Jul 25, 2024  9057 Presents with COPD exacerbation, still severe wheezing and prolonged expiratory phase despite steroids and 3 nebs given by EMS prior to arrival in the ED.  Will continue bronchodilators, obtain CT to rule out PE. [PS]  1155 Case discussed with hospitalist [PS]  1223 Prior to IV hydralazine , repeat BP is 105/77. Will cancel hydralazine . Having itching after rocephin  started - IV benadryl . [PS]    Clinical Course User Index [PS] Viviann Pastor, MD     FINAL CLINICAL IMPRESSION(S) / ED DIAGNOSES   Final diagnoses:  COPD exacerbation (HCC)  Chronic respiratory failure with hypoxia (HCC)     Rx / DC Orders   ED Discharge Orders     None        Note:  This document was prepared using Dragon voice recognition software and may include unintentional dictation errors.   Viviann Pastor, MD 07/25/24 1340  "

## 2024-07-25 NOTE — ED Triage Notes (Signed)
 Pt reports SOB when getting up to use the restroom this morning. States he wears O2 at night at but was unable to increase it this morning due to how SOB he was. Denies CP. Pt smokes 1-1.5 packs a day.

## 2024-07-25 NOTE — ED Triage Notes (Signed)
 Arrived by [facility] from home. SOB started two hours ago.   History COPD  206/98 b/p 102HR 100% RA  2.5 albuterol  given by FD  EMS administered:  Duoneb, 2.5 albuterol , and solumedrol 125mg 

## 2024-07-25 NOTE — Assessment & Plan Note (Signed)
 Place patient on seizure precautions Continue Lamictal 

## 2024-07-25 NOTE — Assessment & Plan Note (Signed)
 Monitor closely for signs and symptoms of alcohol withdrawal Place patient on CIWA protocol and administer for CIWA score of 8 or greater

## 2024-07-25 NOTE — Assessment & Plan Note (Signed)
 History of arrhythmia status post Linq implantation Recent 72-hour Holter monitor which did not show any evidence of arrhythmias

## 2024-07-25 NOTE — ED Notes (Signed)
 Pt had a sudden onset of itching after the Rocephin  was started, pt has never had this medication before. Denies any other symptoms. Stopped infusion at this time and notified Dr. Viviann, EDP.

## 2024-07-25 NOTE — Plan of Care (Signed)

## 2024-07-25 NOTE — Assessment & Plan Note (Signed)
 Most likely secondary to demand ischemia from respiratory distress related to acute COPD exacerbation Will cycle cardiac enzymes Patient had a recent 2D echocardiogram which was done on 04/05/25 and showed normal LV function He also had a [employer] which showed a small fixed apical wall defect with mild apical scar without significant ischemia Continue aspirin  and statins

## 2024-07-25 NOTE — ED Provider Triage Note (Signed)
 Emergency Medicine Provider Triage Evaluation Note  George Arellano , a 59 y.o. male  was evaluated in triage.  Pt complains of SOB since this morning. Wears O2 at night. Did not increase his O2 because could not get to his machine. Still smokes 1ppd-1.5ppd. No chest pain. No recent illness. No history of blood clots. Got nebs with EMS.  Review of Systems  Positive: SOB Negative: CP, fever  Physical Exam  There were no vitals taken for this visit. Gen:   Awake, no distress   Resp:  Normal effort. On O2. Speaking in complete sentences MSK:   Moves extremities without difficulty  Other:    Medical Decision Making  Medically screening exam initiated at 8:02 AM.  Appropriate orders placed.  George Arellano was informed that the remainder of the evaluation will be completed by another provider, this initial triage assessment does not replace that evaluation, and the importance of remaining in the ED until their evaluation is complete.     Elmar Antigua E, PA-C 07/25/24 9193

## 2024-07-25 NOTE — H&P (Addendum)
 " History and Physical    Patient: George Arellano FMW:[id number] DOB: [birth year] DOA: 07/25/2024 DOS: the patient was seen and examined on 07/26/2024 PCP: System, Provider Not In  Patient coming from: Home  Chief Complaint:  Chief Complaint  Patient presents with   Shortness of Breath   HPI: George Arellano is a 59 y.o. male with medical history significant for COPD with chronic respiratory failure on 3 L of oxygen at night, history of nicotine  dependence, seizure disorder, history of depression, A-fib, anxiety who presents to the emergency by EMS for evaluation of shortness of breath that woke him up out of his sleep. Patient notes that he was in his usual state of health until the early hours of this morning when he woke up and was unable to breathe.  He used his rescue inhaler and inhaled steroid without any improvement in his symptoms and so he called EMS. Shortness of breath is associated with chest pain over the anterior chest wall, diaphoresis and palpitations.  He denies having any cough, no nausea, no vomiting, no abdominal pain, no changes in his bowel habits, no fever, no chills, no abdominal pain, no urinary symptoms, no changes in his bowel habits, no blood pressure no focal deficit. EMS administered 3 DuoNeb, albuterol  and 125 mg of Solu-Medrol  IV. Abnormal labs include uptrending troponin levels 38 >> 72 Chest x-ray reviewed by me shows mildly increased peripheral septal markings in the lower lung zones, most suggestive of mild interstitial pulmonary edema. CT angiogram of the chest showed no evidence of pulmonary embolism to the level of the distal segmental pulmonary arteries bilaterally; evaluation of subsegmental branches, especially in the left lower lobe, is limited by motion. Diffuse bronchial wall thickening throughout both lungs with centrilobular and paraseptal emphysema. No superimposed pneumonia, pleural effusion, or pulmonary edema. Twelve-lead EKG reviewed by me showed  sinus tachycardia Despite several bronchodilator treatments and systemic steroids, patient remained short of breath with diffuse wheezing. Patient will be referred to observation status for further evaluation   Review of Systems: As mentioned in the history of present illness. All other systems reviewed and are negative. Past Medical History:  Diagnosis Date   Acute cholecystitis with chronic cholecystitis    Alcohol abuse    Anxiety    CHF (congestive heart failure) (HCC)    COPD (chronic obstructive pulmonary disease) (HCC)    Depression    Dyspnea    Dysrhythmia    A Fib   Hypercholesterolemia    Hypertension    Neuromuscular disorder (HCC)    Seizures (HCC) 12/2018   Stroke (HCC) 07/2017   balance issues, left sided deficit   Umbilical hernia without obstruction and without gangrene    Wears dentures    full upper and lower   Past Surgical History:  Procedure Laterality Date   CATARACT EXTRACTION W/PHACO Left 08/09/2019   Procedure: CATARACT EXTRACTION PHACO AND INTRAOCULAR LENS PLACEMENT (IOC) LEFT 3.75  00:40.6  9.2%;  Surgeon: Ferol Rogue, MD;  Location: [facility];  Service: Ophthalmology;  Laterality: Left;   CATARACT EXTRACTION W/PHACO Right 06/10/2020   Procedure: CATARACT EXTRACTION PHACO AND INTRAOCULAR LENS PLACEMENT (IOC) RIGHT;  Surgeon: Jaye Fallow, MD;  Location: [facility];  Service: Ophthalmology;  Laterality: Right;  2.62 0:31.5   CHOLECYSTECTOMY N/A 08/28/2018   Procedure: LAPAROSCOPIC CHOLECYSTECTOMY umbilical hernia repair;  Surgeon: Nicholaus Selinda Birmingham, MD;  Location: [facility];  Service: General;  Laterality: N/A;   LOOP RECORDER INSERTION N/A 09/15/2017   Procedure: LOOP RECORDER INSERTION;  Surgeon: Ammon Blunt, MD;  Location: [facility];  Service: Cardiovascular;  Laterality: N/A;   Social History:  reports that he has been smoking cigarettes. He has a 64.5 pack-year smoking history. He has never used smokeless tobacco. He  reports current alcohol use of about 42.0 standard drinks of alcohol per week. He reports current drug use. Drug: Marijuana.  Allergies[1]  Family History  Problem Relation Age of Onset   Hypertension Other     Prior to Admission medications  Medication Sig Start Date End Date Taking? Authorizing Provider  albuterol  (PROVENTIL  HFA;VENTOLIN  HFA) 108 (90 BASE) MCG/ACT inhaler Inhale 2 puffs into the lungs every 6 (six) hours as needed for wheezing or shortness of breath.   Yes [provider]  aspirin  EC 81 MG tablet Take 81 mg by mouth daily. Swallow whole.   Yes [provider]  budesonide -formoterol  (SYMBICORT ) 160-4.5 MCG/ACT inhaler Inhale 2 puffs into the lungs 2 (two) times daily.   Yes [provider]  busPIRone  (BUSPAR ) 10 MG tablet Take 10 mg by mouth 3 (three) times daily. 07/24/19  Yes [provider]  diphenhydrAMINE  (BENADRYL ) 25 MG tablet Take 25 mg by mouth daily.   Yes [provider]  ezetimibe (ZETIA) 10 MG tablet Take 10 mg by mouth daily. 12/15/23  Yes [provider]  furosemide  (LASIX ) 20 MG tablet Take 20 mg by mouth daily.   Yes [provider]  gabapentin  (NEURONTIN ) 300 MG capsule Take 300 mg by mouth 2 (two) times daily.  08/15/17 07/25/24 Yes [provider]  Ipratropium-Albuterol  (COMBIVENT RESPIMAT) 20-100 MCG/ACT AERS respimat Inhale 2 puffs into the lungs in the morning and at bedtime.   Yes [provider]  ipratropium-albuterol  (DUONEB) 0.5-2.5 (3) MG/3ML SOLN Take 3 mLs by nebulization every 6 (six) hours as needed (for shortness of breath/wheezing). 09/01/22  Yes Caleen Qualia, MD  lamoTRIgine  (LAMICTAL ) 150 MG tablet Take 150 mg by mouth daily. In the morning 04/22/20  Yes [provider]  lamoTRIgine  (LAMICTAL ) 200 MG tablet Take 200 mg by mouth at bedtime. In the evening. 04/22/20  Yes [provider]  midodrine  (PROAMATINE ) 2.5 MG tablet Take 2.5 mg by mouth once  as needed.   Yes [provider]  Multiple Vitamin (MULTIVITAMIN WITH MINERALS) TABS tablet Take 1 tablet by mouth daily.   Yes [provider]  potassium chloride  SA (KLOR-CON  M) 20 MEQ tablet Take 10 mEq by mouth 2 (two) times daily.   Yes [provider]  Vitamin D , Ergocalciferol , (DRISDOL ) 1.25 MG (50000 UNIT) CAPS capsule Take 50,000 Units by mouth once a week. 08/09/22  Yes [provider]  hydrochlorothiazide  (HYDRODIURIL ) 12.5 MG tablet Take 12.5 mg by mouth daily. Patient not taking: Reported on 07/25/2024 08/18/22   [provider]  losartan  (COZAAR ) 25 MG tablet Take 25 mg by mouth daily. Patient not taking: Reported on 07/25/2024 08/18/22 08/18/23  [provider]  metoprolol  tartrate (LOPRESSOR ) 50 MG tablet Take 50 mg by mouth 2 (two) times daily.  Patient not taking: Reported on 07/25/2024 12/07/17   [provider]  predniSONE  (DELTASONE ) 10 MG tablet Take 4 tablet by mouth once a day for 4 days, then 3 by mouth daily for 4 days, then 2 by mouth daily for 4 days, then 1 by mouth daily for 4 days. Patient not taking: Reported on 07/25/2024 06/06/24   [provider]  vitamin B-12 (CYANOCOBALAMIN ) 1000 MCG tablet Take 1,000 mcg by mouth daily. Patient  not taking: Reported on 05/24/2022    [provider]    Physical Exam: Vitals:   07/26/24 0500 07/26/24 0635 07/26/24 0747 07/26/24 1027  BP: 94/68  90/66 90/66  Pulse: 70  82 82  Resp:      Temp:   98 F (36.7 C)   TempSrc:   Oral   SpO2:  95% 98%   Weight:      Height:       Physical Exam Vitals and nursing note reviewed.  Constitutional:      Comments: Chronically ill-appearing  HENT:     Head: Normocephalic and atraumatic.  Eyes:     Pupils: Pupils are equal, round, and reactive to light.  Cardiovascular:     Rate and Rhythm: Tachycardia present.  Pulmonary:     Effort: Tachypnea present.     Breath sounds: Examination of the right-upper  field reveals wheezing and rhonchi. Examination of the left-upper field reveals wheezing and rhonchi. Examination of the right-middle field reveals wheezing and rhonchi. Examination of the left-middle field reveals wheezing and rhonchi. Examination of the right-lower field reveals wheezing and rhonchi. Examination of the left-lower field reveals wheezing and rhonchi. Wheezing and rhonchi present.  Abdominal:     General: Bowel sounds are normal.     Palpations: Abdomen is soft.  Musculoskeletal:        General: Normal range of motion.     Cervical back: Normal range of motion and neck supple.  Skin:    General: Skin is warm and dry.  Neurological:     General: No focal deficit present.     Mental Status: He is oriented to person, place, and time.  Psychiatric:        Mood and Affect: Mood is anxious.     Data Reviewed: Data Reviewed: Relevant notes from primary care and specialist visits, past discharge summaries as available in EHR, including Care Everywhere. Prior diagnostic testing as pertinent to current admission diagnoses Updated medications and problem lists for reconciliation ED course, including vitals, labs, imaging, treatment and response to treatment Triage notes, nursing and pharmacy notes and ED provider's notes Notable results as noted in HPI Sodium 144, potassium 3.5, chloride 103, bicarb 27, glucose 115, BUN 8, creatinine 0.78, calcium  9.5, hemoglobin 16, MCV 106, platelet count 104 Labs reviewed  Assessment and Plan: * COPD with acute exacerbation (HCC) Patient with a history of COPD with chronic respiratory failure on 3 L of oxygen at bedtime and continued nicotine  use who presents for evaluation of worsening shortness of breath from his baseline associated with diffuse wheezes and rhonchi. Will place patient on inhaled and systemic steroids No indication for continued antibiotic therapy at this time Place patient on scheduled and as needed bronchodilator  therapy Continue oxygen supplementation to maintain pulse oximetry greater than 92%  Elevated troponin I level Most likely secondary to demand ischemia from respiratory distress related to acute COPD exacerbation Will cycle cardiac enzymes Patient had a recent 2D echocardiogram which was done on 04/05/25 and showed normal LV function He also had a [employer] which showed a small fixed apical wall defect with mild apical scar without significant ischemia Continue aspirin  and statins   Nicotine  dependence Smoking cessation was discussed with patient in detail He declines a nicotine  transdermal patch at this time  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) History of arrhythmia status post Linq implantation Recent 72-hour Holter monitor which did not show any evidence of arrhythmias  HTN (  hypertension) Blood pressure was elevated upon arrival and most likely related to respiratory distress and anxiety At baseline patient, patient has relative hypotension and is prescribed midodrine  for systolic blood pressure less than 110 Monitor blood pressure closely  Nonintractable epilepsy without status epilepticus (HCC) Place patient on seizure precautions Continue Lamictal   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  He verbalizes understanding and agrees to the plan  Severity of Illness: The appropriate patient status for this patient is OBSERVATION. Observation status is judged to be reasonable and necessary in order to provide the required intensity of service to ensure the  patient's safety. The patient's presenting symptoms, physical exam findings, and initial radiographic and laboratory data in the context of their medical condition is felt to place them at decreased risk for further clinical deterioration. Furthermore, it is anticipated that the patient will be medically stable for discharge from the hospital within 2 midnights of admission.   Author: Aimee Somerset, MD 07/26/2024 12:30 PM  For on call review [email or web address].      [1]  Allergies Allergen Reactions   Atorvastatin  Hives and Itching    All over trunk of body   Levaquin [Levofloxacin] Hives and Other (See Comments)    Rash, itching all over trunk of body   "

## 2024-07-26 DIAGNOSIS — J441 Chronic obstructive pulmonary disease with (acute) exacerbation: Secondary | ICD-10-CM | POA: Diagnosis not present

## 2024-07-26 DIAGNOSIS — F17200 Nicotine dependence, unspecified, uncomplicated: Secondary | ICD-10-CM | POA: Diagnosis present

## 2024-07-26 LAB — RESPIRATORY PANEL BY PCR

## 2024-07-26 LAB — EXPECTORATED SPUTUM ASSESSMENT W GRAM STAIN, RFLX TO RESP C

## 2024-07-26 LAB — CBC
HCT: 44.4 % (ref 39.0–52.0)
Hemoglobin: 14.9 g/dL (ref 13.0–17.0)
MCH: 35.7 pg — ABNORMAL HIGH (ref 26.0–34.0)
MCHC: 33.6 g/dL (ref 30.0–36.0)
MCV: 106.5 fL — ABNORMAL HIGH (ref 80.0–100.0)
Platelets: 113 K/uL — ABNORMAL LOW (ref 150–400)
RBC: 4.17 MIL/uL — ABNORMAL LOW (ref 4.22–5.81)
RDW: 12.5 % (ref 11.5–15.5)
WBC: 11.5 K/uL — ABNORMAL HIGH (ref 4.0–10.5)
nRBC: 0 % (ref 0.0–0.2)

## 2024-07-26 LAB — BASIC METABOLIC PANEL WITH GFR
Anion gap: 9 (ref 5–15)
BUN: 14 mg/dL (ref 6–20)
CO2: 28 mmol/L (ref 22–32)
Calcium: 9.2 mg/dL (ref 8.9–10.3)
Chloride: 102 mmol/L (ref 98–111)
Creatinine, Ser: 0.8 mg/dL (ref 0.61–1.24)
GFR, Estimated: >60 mL/min
Glucose, Bld: 135 mg/dL — ABNORMAL HIGH (ref 70–99)
Potassium: 4.1 mmol/L (ref 3.5–5.1)
Sodium: 138 mmol/L (ref 135–145)

## 2024-07-26 LAB — HIV ANTIBODY (ROUTINE TESTING W REFLEX): HIV Screen 4th Generation wRfx: NONREACTIVE

## 2024-07-26 MED ORDER — K PHOS MONO-SOD PHOS DI & MONO 155-852-130 MG PO TABS
500.0000 mg | ORAL_TABLET | Freq: Three times a day (TID) | ORAL | Status: AC
  Administered 2024-07-26 – 2024-07-28 (×6): 500 mg via ORAL
  Filled 2024-07-26 (×6): qty 2

## 2024-07-26 MED ORDER — IPRATROPIUM-ALBUTEROL 0.5-2.5 (3) MG/3ML IN SOLN
3.0000 mL | Freq: Four times a day (QID) | RESPIRATORY_TRACT | Status: AC
  Administered 2024-07-26 – 2024-07-27 (×4): 3 mL via RESPIRATORY_TRACT
  Filled 2024-07-26 (×4): qty 3

## 2024-07-26 MED ORDER — BUDESONIDE 0.5 MG/2ML IN SUSP
0.5000 mg | Freq: Two times a day (BID) | RESPIRATORY_TRACT | Status: DC
  Administered 2024-07-26 – 2024-07-27 (×4): 0.5 mg via RESPIRATORY_TRACT
  Filled 2024-07-26 (×3): qty 2

## 2024-07-26 NOTE — Discharge Instructions (Signed)

## 2024-07-26 NOTE — Assessment & Plan Note (Signed)
Smoking cessation was discussed with patient in detail He declines a nicotine transdermal patch at this time 

## 2024-07-26 NOTE — TOC CM/SW Note (Signed)
 Transition of Care [facility]) - Inpatient Brief Assessment   Patient Details  Name: George Arellano MRN: [id number] Date of Birth: 12-25-1965  Transition of Care [facility]) CM/SW Contact:    [name], RN Phone Number: 07/26/2024, 10:25 AM   Clinical Narrative:  Transition of Care Department [facility]) has reviewed patient and no TOC needs have been identified at this time.  If new patient transition needs arise, please place a TOC consult.  Reported that patient only wears his o2 nocturnal.  Confirmed with mitch at [facility] that patient has ordres for 3 L continuous   Substance abuse resources added to AVS  Transition of Care Asessment: Insurance and Status: Insurance coverage has been reviewed Patient has primary care physician:  (Per Medicaid card assigned PCP is Pharmacologist)     Prior/Current Home Services: No current home services Social Drivers of Health Review: SDOH reviewed no interventions necessary Readmission risk has been reviewed: Yes Transition of care needs: no transition of care needs at this time

## 2024-07-26 NOTE — Progress Notes (Signed)
 " Progress Note   Patient: George Arellano FMW:[id number] DOB: 13-Aug-1965 DOA: 07/25/2024     0 DOS: the patient was seen and examined on 07/26/2024   Brief hospital course:  George Arellano is a 59 y.o. male with medical history significant for COPD with chronic respiratory failure on 3 L of oxygen at night, history of nicotine  dependence, seizure disorder, history of depression, A-fib, anxiety who presents to the emergency by EMS for evaluation of shortness of breath that woke him up out of his sleep. Patient notes that he was in his usual state of health until the early hours of this morning when he woke up and was unable to breathe.  He used his rescue inhaler and inhaled steroid without any improvement in his symptoms and so he called EMS. Shortness of breath is associated with chest pain over the anterior chest wall, diaphoresis and palpitations.  He denies having any cough, no nausea, no vomiting, no abdominal pain, no changes in his bowel habits, no fever, no chills, no abdominal pain, no urinary symptoms, no changes in his bowel habits, no blood pressure no focal deficit. EMS administered 3 DuoNeb, albuterol  and 125 mg of Solu-Medrol  IV. Abnormal labs include uptrending troponin levels 38 >> 72 Chest x-ray reviewed by me shows mildly increased peripheral septal markings in the lower lung zones, most suggestive of mild interstitial pulmonary edema. CT angiogram of the chest showed no evidence of pulmonary embolism to the level of the distal segmental pulmonary arteries bilaterally; evaluation of subsegmental branches, especially in the left lower lobe, is limited by motion. Diffuse bronchial wall thickening throughout both lungs with centrilobular and paraseptal emphysema. No superimposed pneumonia, pleural effusion, or pulmonary edema. Twelve-lead EKG reviewed by me showed sinus tachycardia Despite several bronchodilator treatments and systemic steroids, patient remained short of breath with diffuse  wheezing. Patient will be referred to observation status for further evaluation     Assessment and Plan:   COPD with acute exacerbation (HCC) Patient with a history of COPD with chronic respiratory failure on 3 L of oxygen at night and continued nicotine  use who presented for evaluation of worsening shortness of breath from his baseline associated with diffuse wheezes and rhonchi. Continue inhaled and systemic steroids No indication for continued antibiotic therapy at this time Continue scheduled and as needed bronchodilator therapy Continue oxygen supplementation to maintain pulse oximetry greater than 92% Will need to be assessed for continuous oxygen supplementation upon discharge   Elevated troponin I level Most likely secondary to demand ischemia from respiratory distress related to acute COPD exacerbation Troponin levels show a downward trend Patient had a recent 2D echocardiogram which was done on 04/05/25 and showed normal LV function He also had a [employer] which showed a small fixed apical wall defect with mild apical scar without significant ischemia Continue aspirin  and statins     Nicotine  dependence Smoking cessation was discussed with patient in detail He declines a nicotine  transdermal patch at this time   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) History of arrhythmia status post Linq implantation Recent 72-hour Holter monitor which did not show any evidence of arrhythmias   HTN (hypertension) Blood pressure was elevated upon arrival and most likely related to respiratory distress and anxiety At baseline patient, patient has relative hypotension and is prescribed midodrine  for systolic blood pressure less than 110 Monitor blood pressure closely   Nonintractable epilepsy without status epilepticus (HCC) Continue seizure precautions Continue Lamictal    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  Feels better but continues to have some wheezing  Physical Exam: Vitals:   07/26/24 0500 07/26/24 0635 07/26/24 0747 07/26/24 1027  BP: 94/68  90/66 90/66  Pulse: 70  82 82  Resp:      Temp:   98 F (36.7 C)   TempSrc:   Oral   SpO2:  95% 98%   Weight:      Height:       Constitutional:      Comments: Chronically ill-appearing  HENT:     Head: Normocephalic and atraumatic.  Eyes:     Pupils: Pupils are equal, round, and reactive to light.  Cardiovascular:     Rate and Rhythm: RRR S1,S2  Pulmonary:     Effort: Within normal limits    Breath sounds: Scattered wheezes and rhonchi Abdominal:     General: Bowel sounds are normal.     Palpations: Abdomen is soft.  Musculoskeletal:        General: Normal range of motion.     Cervical back: Normal range of motion and neck supple.  Skin:    General: Skin is warm and dry.  Neurological:     General: No focal deficit present.     Mental Status: He is oriented to person, place, and time.  Psychiatric:        Mood and Affect: Mood is anxious.       Data Reviewed: Labs reviewed.  White count 11.5, MCV 106.5  Labs reviewed  Family Communication: Plan of care discussed with patient at the bedside.  He verbalizes understanding and agrees with the plan  Disposition: Status is: Observation The patient remains OBS appropriate and will d/c before 2 midnights.  Planned Discharge Destination: Home    Time spent: 40 minutes  Author: Aimee Somerset, MD 07/26/2024 12:28 PM  For on call review [email or web address]  "

## 2024-07-27 ENCOUNTER — Telehealth (HOSPITAL_COMMUNITY): Payer: Self-pay

## 2024-07-27 ENCOUNTER — Other Ambulatory Visit (HOSPITAL_COMMUNITY): Payer: Self-pay

## 2024-07-27 DIAGNOSIS — J441 Chronic obstructive pulmonary disease with (acute) exacerbation: Secondary | ICD-10-CM | POA: Diagnosis not present

## 2024-07-27 LAB — HEMOGLOBIN A1C
Hgb A1c MFr Bld: 5 % (ref 4.8–5.6)
Mean Plasma Glucose: 96.8 mg/dL

## 2024-07-27 MED ORDER — LORAZEPAM 2 MG/ML IJ SOLN: 2.0000 mg | Freq: Once | INTRAMUSCULAR | Status: DC

## 2024-07-27 MED ORDER — LORAZEPAM 2 MG/ML IJ SOLN: 1.0000 mg | Freq: Once | INTRAMUSCULAR | Status: DC

## 2024-07-27 MED ORDER — MIDODRINE HCL 5 MG PO TABS
5.0000 mg | ORAL_TABLET | Freq: Three times a day (TID) | ORAL | Status: DC
  Administered 2024-07-27 – 2024-07-28 (×2): 5 mg via ORAL
  Filled 2024-07-27 (×2): qty 1

## 2024-07-27 MED ORDER — UMECLIDINIUM-VILANTEROL 62.5-25 MCG/ACT IN AEPB
1.0000 | INHALATION_SPRAY | Freq: Every day | RESPIRATORY_TRACT | Status: DC
  Administered 2024-07-28 – 2024-07-31 (×4): 1 via RESPIRATORY_TRACT
  Filled 2024-07-27: qty 14

## 2024-07-27 NOTE — Telephone Encounter (Signed)
 Pharmacy Patient Advocate Encounter  Insurance verification completed.    The patient is insured through [facility] MEDICAID.     Ran test claim for Stiolto Respimat and the current 30 day co-pay is $4.  Ran test claim for Anoro Ellipta  62.5-25mcg and the current 30 day co-pay is $4.  This test claim was processed through [employer]- copay amounts may vary at other pharmacies due to [employer], or as the patient moves through the different stages of their insurance plan.

## 2024-07-27 NOTE — Progress Notes (Signed)
 " Progress Note   Patient: George Arellano FMW:[id number] DOB: Aug 23, 1965 DOA: 07/25/2024     0 DOS: the patient was seen and examined on 07/27/2024   Brief hospital course:  George Arellano is a 59 y.o. male with medical history significant for COPD with chronic respiratory failure on 3 L of oxygen at night, history of nicotine  dependence, seizure disorder, history of depression, A-fib, anxiety who presents to the emergency by EMS for evaluation of shortness of breath that woke him up out of his sleep. Patient notes that he was in his usual state of health until the early hours of this morning when he woke up and was unable to breathe.  He used his rescue inhaler and inhaled steroid without any improvement in his symptoms and so he called EMS. Shortness of breath is associated with chest pain over the anterior chest wall, diaphoresis and palpitations.  He denies having any cough, no nausea, no vomiting, no abdominal pain, no changes in his bowel habits, no fever, no chills, no abdominal pain, no urinary symptoms, no changes in his bowel habits, no blood pressure no focal deficit. EMS administered 3 DuoNeb, albuterol  and 125 mg of Solu-Medrol  IV. Abnormal labs include uptrending troponin levels 38 >> 72 Chest x-ray reviewed by me shows mildly increased peripheral septal markings in the lower lung zones, most suggestive of mild interstitial pulmonary edema. CT angiogram of the chest showed no evidence of pulmonary embolism to the level of the distal segmental pulmonary arteries bilaterally; evaluation of subsegmental branches, especially in the left lower lobe, is limited by motion. Diffuse bronchial wall thickening throughout both lungs with centrilobular and paraseptal emphysema. No superimposed pneumonia, pleural effusion, or pulmonary edema. Twelve-lead EKG reviewed by me showed sinus tachycardia Despite several bronchodilator treatments and systemic steroids, patient remained short of breath with diffuse  wheezing. Patient will be referred to observation status for further evaluation       Assessment and Plan:    COPD with acute exacerbation (HCC) Patient with a history of COPD with chronic respiratory failure on 3 L of oxygen Patient is supposed to be on 3 L of oxygen continuous but only uses it at night He presented for evaluation of worsening shortness of breath from his baseline associated with diffuse wheezes and rhonchi. Continue inhaled and systemic steroids No indication for continued antibiotic therapy at this time Continue scheduled and as needed bronchodilator therapy Continue oxygen supplementation to maintain pulse oximetry greater than 92%   Elevated troponin I level Most likely secondary to demand ischemia from respiratory distress related to acute COPD exacerbation Troponin levels show a downward trend Patient had a recent 2D echocardiogram which was done on 04/05/25 and showed normal LV function He also had a [employer] which showed a small fixed apical wall defect with mild apical scar without significant ischemia Continue aspirin  and statins     Nicotine  dependence Smoking cessation was discussed with patient in detail He declines a nicotine  transdermal patch at this time    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) History of arrhythmia status post Linq implantation Recent 72-hour Holter monitor which did not show any evidence of arrhythmias    HTN (hypertension) Blood pressure was elevated upon arrival and most likely related to respiratory distress and anxiety At baseline patient, patient has relative hypotension and is prescribed midodrine  for systolic blood pressure less than 110 Monitor blood pressure closely    Nonintractable epilepsy without status epilepticus (HCC) Continue seizure precautions Continue Lamictal   Hemiparesis affecting nondominant side as late effect of stroke (HCC) History of prior CVA Continue  aspirin  and statins    Alcohol use disorder Admits to daily alcohol use with symptoms of alcohol withdrawal when he does not drink Monitor closely for signs and symptoms of alcohol withdrawal as patient is at high risk of developing DTs Continue CIWA protocol and administer for CIWA score of 8 or greater       Hypophosphatemia Supplement phosphorus          Subjective: Continues to have diffuse wheezes  Physical Exam: Vitals:   07/27/24 0726 07/27/24 0823 07/27/24 1100 07/27/24 1111  BP:  (!) 120/59  98/70  Pulse:  84  82  Resp:  19    Temp:  98.3 F (36.8 C)    TempSrc:      SpO2: 97% 98% 96%   Weight:      Height:       Constitutional:      Comments: Chronically ill-appearing  HENT:     Head: Normocephalic and atraumatic.  Eyes:     Pupils: Pupils are equal, round, and reactive to light.  Cardiovascular:     Rate and Rhythm: RRR S1,S2  Pulmonary:     Effort: Within normal limits    Breath sounds: Scattered wheezes and rhonchi Abdominal:     General: Bowel sounds are normal.     Palpations: Abdomen is soft.  Musculoskeletal:        General: Normal range of motion.     Cervical back: Normal range of motion and neck supple.  Skin:    General: Skin is warm and dry.  Neurological:     General: No focal deficit present.  Mild upper extremity tremors    Mental Status: He is oriented to person, place, and time.  Psychiatric:        Mood and Affect: Mood is anxious.     Data Reviewed:  There are no new results to review at this time.  Family Communication: Plan of care was discussed with patient in detail.  He verbalizes understanding and agrees with the plan  Disposition: Status is: Observation The patient remains OBS appropriate and will d/c before 2 midnights.  Planned Discharge Destination: Home    Time spent: 40 minutes  Author: Aimee Somerset, MD 07/27/2024 1:13 PM  For on call review [email or web address]  "

## 2024-07-27 NOTE — Plan of Care (Signed)
 Pt a&o x4. Required PRN Ativan  x2 for increased agitation, confusion, tremors, and anxiety (see CIWA flowsheet). Scheduled Ativan  given per order.  Problem: Education: Goal: Knowledge of General Education information will improve Description: Including pain rating scale, medication(s)/side effects and non-pharmacologic comfort measures Outcome: Progressing   Problem: Health Behavior/Discharge Planning: Goal: Ability to manage health-related needs will improve Outcome: Progressing   Problem: Clinical Measurements: Goal: Ability to maintain clinical measurements within normal limits will improve Outcome: Progressing Goal: Will remain free from infection Outcome: Progressing Goal: Diagnostic test results will improve Outcome: Progressing Goal: Respiratory complications will improve Outcome: Progressing Goal: Cardiovascular complication will be avoided Outcome: Progressing   Problem: Activity: Goal: Risk for activity intolerance will decrease Outcome: Progressing   Problem: Nutrition: Goal: Adequate nutrition will be maintained Outcome: Progressing   Problem: Coping: Goal: Level of anxiety will decrease Outcome: Progressing   Problem: Elimination: Goal: Will not experience complications related to bowel motility Outcome: Progressing Goal: Will not experience complications related to urinary retention Outcome: Progressing   Problem: Pain Managment: Goal: General experience of comfort will improve and/or be controlled Outcome: Progressing   Problem: Safety: Goal: Ability to remain free from injury will improve Outcome: Progressing   Problem: Skin Integrity: Goal: Risk for impaired skin integrity will decrease Outcome: Progressing   Problem: Education: Goal: Knowledge of disease or condition will improve Outcome: Progressing Goal: Knowledge of the prescribed therapeutic regimen will improve Outcome: Progressing Goal: Individualized Educational Video(s) Outcome:  Progressing   Problem: Activity: Goal: Ability to tolerate increased activity will improve Outcome: Progressing Goal: Will verbalize the importance of balancing activity with adequate rest periods Outcome: Progressing   Problem: Respiratory: Goal: Ability to maintain a clear airway will improve Outcome: Progressing Goal: Levels of oxygenation will improve Outcome: Progressing Goal: Ability to maintain adequate ventilation will improve Outcome: Progressing

## 2024-07-27 NOTE — TOC Progression Note (Signed)
 Transition of Care [facility]) - Progression Note    Patient Details  Name: George Arellano MRN: [id number] Date of Birth: [birth year]  Transition of Care [facility]) CM/SW Contact  [name], RN Phone Number: 07/27/2024, 1:41 PM  Clinical Narrative:     Patient confirms he has portable O2 tank at home.  He states at discharge his mother will be able to bring a portable tank and transport                     Expected Discharge Plan and Services                                               Social Drivers of Health (SDOH) Interventions SDOH Screenings   Food Insecurity: No Food Insecurity (07/25/2024)  Housing: Low Risk (07/25/2024)  Transportation Needs: No Transportation Needs (07/25/2024)  Utilities: Not At Risk (07/25/2024)  Financial Resource Strain: Low Risk  (11/30/2023)   Received from [facility]  Tobacco Use: High Risk (07/25/2024)    Readmission Risk Interventions     No data to display

## 2024-07-28 DIAGNOSIS — F17210 Nicotine dependence, cigarettes, uncomplicated: Secondary | ICD-10-CM | POA: Diagnosis present

## 2024-07-28 DIAGNOSIS — F32A Depression, unspecified: Secondary | ICD-10-CM | POA: Diagnosis present

## 2024-07-28 DIAGNOSIS — E7800 Pure hypercholesterolemia, unspecified: Secondary | ICD-10-CM | POA: Diagnosis present

## 2024-07-28 DIAGNOSIS — J9611 Chronic respiratory failure with hypoxia: Secondary | ICD-10-CM | POA: Diagnosis present

## 2024-07-28 DIAGNOSIS — I2489 Other forms of acute ischemic heart disease: Secondary | ICD-10-CM | POA: Diagnosis present

## 2024-07-28 DIAGNOSIS — Z8249 Family history of ischemic heart disease and other diseases of the circulatory system: Secondary | ICD-10-CM | POA: Diagnosis not present

## 2024-07-28 DIAGNOSIS — J438 Other emphysema: Secondary | ICD-10-CM | POA: Diagnosis present

## 2024-07-28 DIAGNOSIS — I69354 Hemiplegia and hemiparesis following cerebral infarction affecting left non-dominant side: Secondary | ICD-10-CM | POA: Diagnosis not present

## 2024-07-28 DIAGNOSIS — I499 Cardiac arrhythmia, unspecified: Secondary | ICD-10-CM | POA: Diagnosis present

## 2024-07-28 DIAGNOSIS — Z7951 Long term (current) use of inhaled steroids: Secondary | ICD-10-CM | POA: Diagnosis not present

## 2024-07-28 DIAGNOSIS — I4891 Unspecified atrial fibrillation: Secondary | ICD-10-CM | POA: Diagnosis present

## 2024-07-28 DIAGNOSIS — Z7982 Long term (current) use of aspirin: Secondary | ICD-10-CM | POA: Diagnosis not present

## 2024-07-28 DIAGNOSIS — F10231 Alcohol dependence with withdrawal delirium: Secondary | ICD-10-CM | POA: Diagnosis present

## 2024-07-28 DIAGNOSIS — D6959 Other secondary thrombocytopenia: Secondary | ICD-10-CM | POA: Diagnosis present

## 2024-07-28 DIAGNOSIS — G40909 Epilepsy, unspecified, not intractable, without status epilepticus: Secondary | ICD-10-CM | POA: Diagnosis present

## 2024-07-28 DIAGNOSIS — I110 Hypertensive heart disease with heart failure: Secondary | ICD-10-CM | POA: Diagnosis present

## 2024-07-28 DIAGNOSIS — J441 Chronic obstructive pulmonary disease with (acute) exacerbation: Secondary | ICD-10-CM | POA: Diagnosis present

## 2024-07-28 DIAGNOSIS — F419 Anxiety disorder, unspecified: Secondary | ICD-10-CM | POA: Diagnosis present

## 2024-07-28 DIAGNOSIS — J432 Centrilobular emphysema: Secondary | ICD-10-CM | POA: Diagnosis present

## 2024-07-28 DIAGNOSIS — Z79899 Other long term (current) drug therapy: Secondary | ICD-10-CM | POA: Diagnosis not present

## 2024-07-28 LAB — CBC
HCT: 44.2 % (ref 39.0–52.0)
Hemoglobin: 15.2 g/dL (ref 13.0–17.0)
MCH: 36.3 pg — ABNORMAL HIGH (ref 26.0–34.0)
MCHC: 34.4 g/dL (ref 30.0–36.0)
MCV: 105.5 fL — ABNORMAL HIGH (ref 80.0–100.0)
Platelets: 123 K/uL — ABNORMAL LOW (ref 150–400)
RBC: 4.19 MIL/uL — ABNORMAL LOW (ref 4.22–5.81)
RDW: 12.7 % (ref 11.5–15.5)
WBC: 7.6 K/uL (ref 4.0–10.5)
nRBC: 0.3 % — ABNORMAL HIGH (ref 0.0–0.2)

## 2024-07-28 LAB — BASIC METABOLIC PANEL WITH GFR
Anion gap: 13 (ref 5–15)
BUN: 19 mg/dL (ref 6–20)
CO2: 24 mmol/L (ref 22–32)
Calcium: 9.2 mg/dL (ref 8.9–10.3)
Chloride: 102 mmol/L (ref 98–111)
Creatinine, Ser: 0.92 mg/dL (ref 0.61–1.24)
GFR, Estimated: >60 mL/min
Glucose, Bld: 74 mg/dL (ref 70–99)
Potassium: 4 mmol/L (ref 3.5–5.1)
Sodium: 139 mmol/L (ref 135–145)

## 2024-07-28 LAB — MAGNESIUM: Magnesium: 2.4 mg/dL (ref 1.7–2.4)

## 2024-07-28 MED ORDER — PHENOBARBITAL SODIUM 130 MG/ML IJ SOLN
97.5000 mg | Freq: Three times a day (TID) | INTRAMUSCULAR | Status: AC
  Administered 2024-07-28 – 2024-07-30 (×6): 97.5 mg via INTRAVENOUS
  Filled 2024-07-28 (×6): qty 1

## 2024-07-28 MED ORDER — SODIUM CHLORIDE 0.9 % IV SOLN
260.0000 mg | Freq: Once | INTRAVENOUS | Status: DC
  Filled 2024-07-28: qty 2

## 2024-07-28 MED ORDER — PHENOBARBITAL SODIUM 130 MG/ML IJ SOLN
130.0000 mg | Freq: Once | INTRAMUSCULAR | Status: AC
  Administered 2024-07-28: 130 mg via INTRAVENOUS
  Filled 2024-07-28: qty 1

## 2024-07-28 MED ORDER — LORAZEPAM 2 MG/ML IJ SOLN: 1.0000 mg | INTRAMUSCULAR | Status: DC | PRN

## 2024-07-28 MED ORDER — PHENOBARBITAL SODIUM 65 MG/ML IJ SOLN
65.0000 mg | Freq: Three times a day (TID) | INTRAMUSCULAR | Status: AC
  Administered 2024-07-30 – 2024-07-31 (×4): 65 mg via INTRAVENOUS
  Filled 2024-07-28 (×5): qty 1

## 2024-07-28 MED ORDER — PHENOBARBITAL SODIUM 65 MG/ML IJ SOLN: 32.5000 mg | Freq: Three times a day (TID) | INTRAMUSCULAR | Status: AC

## 2024-07-28 NOTE — Progress Notes (Signed)
 " Progress Note   Patient: George Arellano FMW:[id number] DOB: 10-14-1965 DOA: 07/25/2024     0 DOS: the patient was seen and examined on 07/28/2024   Brief hospital course:  George Arellano is a 59 y.o. male with medical history significant for COPD with chronic respiratory failure on 3 L of oxygen at night, history of nicotine  dependence, seizure disorder, history of depression, A-fib, anxiety who presents to the emergency by EMS for evaluation of shortness of breath that woke him up out of his sleep. Patient notes that he was in his usual state of health until the early hours of this morning when he woke up and was unable to breathe.  He used his rescue inhaler and inhaled steroid without any improvement in his symptoms and so he called EMS. Shortness of breath is associated with chest pain over the anterior chest wall, diaphoresis and palpitations.  He denies having any cough, no nausea, no vomiting, no abdominal pain, no changes in his bowel habits, no fever, no chills, no abdominal pain, no urinary symptoms, no changes in his bowel habits, no blood pressure no focal deficit. EMS administered 3 DuoNeb, albuterol  and 125 mg of Solu-Medrol  IV. Abnormal labs include uptrending troponin levels 38 >> 72 Chest x-ray reviewed by me shows mildly increased peripheral septal markings in the lower lung zones, most suggestive of mild interstitial pulmonary edema. CT angiogram of the chest showed no evidence of pulmonary embolism to the level of the distal segmental pulmonary arteries bilaterally; evaluation of subsegmental branches, especially in the left lower lobe, is limited by motion. Diffuse bronchial wall thickening throughout both lungs with centrilobular and paraseptal emphysema. No superimposed pneumonia, pleural effusion, or pulmonary edema. Twelve-lead EKG reviewed by me showed sinus tachycardia Despite several bronchodilator treatments and systemic steroids, patient remained short of breath with diffuse  wheezing. Patient will be referred to observation status for further evaluation     01/17 -events of last night noted, patient started on phenobarbital  taper for alcohol withdrawal due to poor response to benzodiazepines.  Safety sitter at the bedside     Assessment and Plan:  Delirium tremens Patient with a history of alcohol abuse with complications of delirium tremens Patient has been receiving lorazepam  per CIWA protocol without any significant improvement and has been started on phenobarbital  taper Will monitor patient closely and will transfer to stepdown for Precedex  if no improvement Place patient on a banana bag    COPD with acute exacerbation (HCC) Patient with a history of COPD with chronic respiratory failure on 3 L of oxygen Patient is supposed to be on 3 L of oxygen continuous but only uses it at night He presented for evaluation of worsening shortness of breath from his baseline associated with diffuse wheezes and rhonchi. Continue inhaled and systemic steroids No indication for continued antibiotic therapy at this time Continue scheduled and as needed bronchodilator therapy Continue oxygen supplementation to maintain pulse oximetry greater than 92%     Elevated troponin I level Most likely secondary to demand ischemia from respiratory distress related to acute COPD exacerbation Troponin levels show a downward trend Patient had a recent 2D echocardiogram which was done on 04/05/25 and showed normal LV function He also had a [employer] which showed a small fixed apical wall defect with mild apical scar without significant ischemia Continue aspirin  and statins     Nicotine  dependence Smoking cessation was discussed with patient in detail He declines a nicotine  transdermal patch at this time  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) History of arrhythmia status post Linq implantation Recent 72-hour Holter monitor which did not  show any evidence of arrhythmias     HTN (hypertension) Blood pressure was elevated upon arrival and most likely related to respiratory distress and anxiety At baseline patient, patient has relative hypotension and is prescribed midodrine  for systolic blood pressure less than 110 Monitor blood pressure closely     Nonintractable epilepsy without status epilepticus (HCC) Continue seizure precautions Continue Lamictal      Hemiparesis affecting nondominant side as late effect of stroke (HCC) History of prior CVA Continue aspirin  and statins       Hypophosphatemia Supplement phosphorus              Subjective: Lethargic.  Arouses easily.  Safety sitter at the bedside  Physical Exam: Vitals:   07/28/24 0341 07/28/24 0344 07/28/24 0436 07/28/24 0927  BP: (!) 152/80 (!) 152/76 (!) 152/76 (!) 148/72  Pulse: 86 89 89 81  Resp: 16  16   Temp: (!) 97.5 F (36.4 C)  98 F (36.7 C)   TempSrc: Oral  Oral   SpO2: 90%  91%   Weight:      Height:       Comments: Sedated.  Opens eyes to loud name call HENT:     Head: Normocephalic and atraumatic.  Eyes:     Pupils: Pupils are equal, round, and reactive to light.  Cardiovascular:     Rate and Rhythm: RRR S1,S2  Pulmonary:     Effort: Within normal limits    Breath sounds: Scattered wheezes and rhonchi Abdominal:     General: Bowel sounds are normal.     Palpations: Abdomen is soft.  Musculoskeletal:        General: Normal range of motion.     Cervical back: Normal range of motion and neck supple.  Skin:    General: Skin is warm and dry.  Neurological:     General: Unable to assess Psychiatric:        Mood and Affect: Unable to assess  Data Reviewed:   Labs reviewed and within normal limits  Family Communication: Attempted to call patient's mother who is listed as his emergency contact.  Left a voicemail awaiting callback  Disposition: Status is: Inpatient Remains inpatient appropriate because: Receiving  treatment for delirium tremens  Planned Discharge Destination: TBD    Time spent: 50 minutes  Author: Aimee Somerset, MD 07/28/2024 12:00 PM  For on call review [email or web address]  "

## 2024-07-28 NOTE — Progress Notes (Signed)
 Pulmicort  discontinued due to patient receiving Anoro Ellipta 

## 2024-07-28 NOTE — Progress Notes (Signed)
 "       Overnight   NAME: George Arellano MRN: [id number] DOB : 1965-10-01    Date of Service   07/28/2024   HPI/Events of Note   HPI:   59 y.o. George with medical history significant for COPD with chronic respiratory failure on 3 L of oxygen at night, history of nicotine  dependence, seizure disorder, history of depression, A-fib, anxiety who presents to the emergency by EMS for evaluation of shortness of breath that woke him up out of his sleep. Patient notes that he was in his usual state of health until the early hours of this morning when he woke up and was unable to breathe.  He used his rescue inhaler and inhaled steroid without any improvement in his symptoms and so he called EMS. Shortness of breath is associated with chest pain over the anterior chest wall, diaphoresis and palpitations.  He denies having any cough, no nausea, no vomiting, no abdominal pain, no changes in his bowel habits, no fever, no chills, no abdominal pain, no urinary symptoms, no changes in his bowel habits, no blood pressure no focal deficit. EMS administered 3 DuoNeb, albuterol  and 125 mg of Solu-Medrol  IV. Abnormal labs include uptrending troponin levels 38 >> 72 Chest x-ray reviewed by me shows mildly increased peripheral septal markings in the lower lung zones, most suggestive of mild interstitial pulmonary edema. CT angiogram of the chest showed no evidence of pulmonary embolism to the level of the distal segmental pulmonary arteries bilaterally; evaluation of subsegmental branches, especially in the left lower lobe, is limited by motion. Diffuse bronchial wall thickening throughout both lungs with centrilobular and paraseptal emphysema. No superimposed pneumonia, pleural effusion, or pulmonary edema. Twelve-lead EKG reviewed by me showed sinus tachycardia Despite several bronchodilator treatments and systemic steroids, patient remained short of breath with diffuse wheezing. Patient will be referred to  observation status for further evaluation    Overnight: Overnight patient becoming increasingly agitated patient has received multiple doses of Ativan  with minimal improvement RN reporting calling security due to aggression towards staff.  On arrival to bedside security at bedside patient lying in bed.  Patient visibly agitated however is redirectable at this time.  Phenobarbital  IV taper ordered.  Physical Exam Constitutional:      Appearance: He is overweight. He is ill-appearing.  HENT:     Mouth/Throat:     Mouth: Mucous membranes are moist.  Eyes:     Pupils: Pupils are equal, round, and reactive to light.  Cardiovascular:     Rate and Rhythm: Regular rhythm. Tachycardia present.     Pulses:          Radial pulses are 2+ on the right side and 2+ on the left side.     Heart sounds: Normal heart sounds, S1 normal and S2 normal.  Pulmonary:     Breath sounds: Transmitted upper airway sounds present. Examination of the right-upper field reveals wheezing. Examination of the left-upper field reveals wheezing. Examination of the right-middle field reveals wheezing. Examination of the left-middle field reveals wheezing. Examination of the right-lower field reveals wheezing. Examination of the left-lower field reveals wheezing. Wheezing present.  Abdominal:     Hernia: A hernia is present.  Musculoskeletal:        General: No swelling.  Skin:    General: Skin is warm and dry.     Capillary Refill: Capillary refill takes 2 to 3 seconds.  Neurological:     Mental Status: He is alert. He is disoriented.  GCS: GCS eye subscore is 4. GCS verbal subscore is 4. GCS motor subscore is 6.  Psychiatric:        Behavior: Behavior is agitated, aggressive and hyperactive.        Judgment: Judgment is impulsive.       Interventions/ Plan   Phenobarbital  taper added for alcohol withdraw Consider Precedex  if unable to manage agitation. Initiate sitter  Updates     Laneta Gardener- Aram BSN  RN CCRN AGACNP-BC Acute Care Nurse Practitioner [facility]  "

## 2024-07-29 ENCOUNTER — Inpatient Hospital Stay

## 2024-07-29 DIAGNOSIS — J441 Chronic obstructive pulmonary disease with (acute) exacerbation: Secondary | ICD-10-CM | POA: Diagnosis not present

## 2024-07-29 LAB — CBC
HCT: 46.1 % (ref 39.0–52.0)
Hemoglobin: 15.8 g/dL (ref 13.0–17.0)
MCH: 36.4 pg — ABNORMAL HIGH (ref 26.0–34.0)
MCHC: 34.3 g/dL (ref 30.0–36.0)
MCV: 106.2 fL — ABNORMAL HIGH (ref 80.0–100.0)
Platelets: 114 K/uL — ABNORMAL LOW (ref 150–400)
RBC: 4.34 MIL/uL (ref 4.22–5.81)
RDW: 12.4 % (ref 11.5–15.5)
WBC: 8.3 K/uL (ref 4.0–10.5)
nRBC: 0 % (ref 0.0–0.2)

## 2024-07-29 LAB — BASIC METABOLIC PANEL WITH GFR
Anion gap: 8 (ref 5–15)
BUN: 18 mg/dL (ref 6–20)
CO2: 34 mmol/L — ABNORMAL HIGH (ref 22–32)
Calcium: 9 mg/dL (ref 8.9–10.3)
Chloride: 100 mmol/L (ref 98–111)
Creatinine, Ser: 0.82 mg/dL (ref 0.61–1.24)
GFR, Estimated: >60 mL/min
Glucose, Bld: 93 mg/dL (ref 70–99)
Potassium: 3.4 mmol/L — ABNORMAL LOW (ref 3.5–5.1)
Sodium: 141 mmol/L (ref 135–145)

## 2024-07-29 LAB — MAGNESIUM: Magnesium: 2.2 mg/dL (ref 1.7–2.4)

## 2024-07-29 MED ORDER — POTASSIUM CHLORIDE CRYS ER 20 MEQ PO TBCR
40.0000 meq | EXTENDED_RELEASE_TABLET | Freq: Once | ORAL | Status: AC
  Administered 2024-07-29: 40 meq via ORAL
  Filled 2024-07-29: qty 2

## 2024-07-29 MED ORDER — BISACODYL 10 MG RE SUPP
10.0000 mg | Freq: Once | RECTAL | Status: AC
  Administered 2024-07-29: 10 mg via RECTAL
  Filled 2024-07-29: qty 1

## 2024-07-29 NOTE — Progress Notes (Signed)
 " Progress Note   Patient: George Arellano FMW:[id number] DOB: 1965-08-15 DOA: 07/25/2024     1 DOS: the patient was seen and examined on 07/29/2024   Brief hospital course:  Jadarius Commons is a 59 y.o. male with medical history significant for COPD with chronic respiratory failure on 3 L of oxygen at night, history of nicotine  dependence, seizure disorder, history of depression, A-fib, anxiety who presents to the emergency by EMS for evaluation of shortness of breath that woke him up out of his sleep. Patient notes that he was in his usual state of health until the early hours of this morning when he woke up and was unable to breathe.  He used his rescue inhaler and inhaled steroid without any improvement in his symptoms and so he called EMS. Shortness of breath is associated with chest pain over the anterior chest wall, diaphoresis and palpitations.  He denies having any cough, no nausea, no vomiting, no abdominal pain, no changes in his bowel habits, no fever, no chills, no abdominal pain, no urinary symptoms, no changes in his bowel habits, no blood pressure no focal deficit. EMS administered 3 DuoNeb, albuterol  and 125 mg of Solu-Medrol  IV. Abnormal labs include uptrending troponin levels 38 >> 72 Chest x-ray reviewed by me shows mildly increased peripheral septal markings in the lower lung zones, most suggestive of mild interstitial pulmonary edema. CT angiogram of the chest showed no evidence of pulmonary embolism to the level of the distal segmental pulmonary arteries bilaterally; evaluation of subsegmental branches, especially in the left lower lobe, is limited by motion. Diffuse bronchial wall thickening throughout both lungs with centrilobular and paraseptal emphysema. No superimposed pneumonia, pleural effusion, or pulmonary edema. Twelve-lead EKG reviewed by me showed sinus tachycardia Despite several bronchodilator treatments and systemic steroids, patient remained short of breath with diffuse  wheezing. Patient will be referred to observation status for further evaluation     01/17:events of last night noted, patient started on phenobarbital  taper for alcohol withdrawal due to poor response to benzodiazepines.  Safety sitter at the bedside  01/18: Oriented to person but not to place or time.  Continues to require recruitment consultant.  Remains on the phenobarbital  taper        Assessment and Plan:  Delirium tremens Patient with a history of alcohol abuse with complications of delirium tremens Patient had been receiving lorazepam  per CIWA protocol without any significant improvement and has been started on phenobarbital  taper Continue phenobarbital  taper Will monitor patient closely and will transfer to stepdown for Precedex  if no improvement        COPD with acute exacerbation (HCC) Improved Patient with a history of COPD with chronic respiratory failure on 3 L of oxygen Patient is supposed to be on 3 L of oxygen continuous but only uses it at night He presented for evaluation of worsening shortness of breath from his baseline associated with diffuse wheezes and rhonchi. Continue inhaled and systemic steroids Continue scheduled and as needed bronchodilator therapy Continue oxygen supplementation to maintain pulse oximetry greater than 92%     Elevated troponin I level Most likely secondary to demand ischemia from respiratory distress related to acute COPD exacerbation Troponin levels show a downward trend Patient had a recent 2D echocardiogram which was done on 04/05/25 and showed normal LV function He also had a [employer] which showed a small fixed apical wall defect with mild apical scar without significant ischemia Continue aspirin  and statins     Nicotine  dependence Smoking cessation  was discussed with patient in detail He declines a nicotine  transdermal patch at this time     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) History of  arrhythmia status post Linq implantation Recent 72-hour Holter monitor which did not show any evidence of arrhythmias     HTN (hypertension) Blood pressure was elevated upon arrival and most likely related to respiratory distress and anxiety At baseline patient, patient has relative hypotension and is prescribed midodrine  for systolic blood pressure less than 110 Monitor blood pressure closely     Nonintractable epilepsy without status epilepticus (HCC) Continue seizure precautions Continue Lamictal      Hemiparesis affecting nondominant 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  Not to place or time  Physical Exam: Vitals:   07/28/24 2306 07/29/24 0539 07/29/24 0843 07/29/24 0902  BP:  110/70  103/73  Pulse:  79  78  Resp:  18 16 15   Temp:  97.7 F (36.5 C)  98.5 F (36.9 C)  TempSrc:    Oral  SpO2: 99% 100%  99%  Weight:      Height:       Comments: Chronically ill-appearing  HENT:     Head: Normocephalic and atraumatic.  Eyes:     Pupils: Pupils are equal, round, and reactive to light.  Cardiovascular:     Rate and Rhythm: RRR S1,S2  Pulmonary:     Effort: Within normal limits    Breath sounds: Scattered wheezes and rhonchi Abdominal:     General: Bowel sounds are normal.     Palpations: Abdomen is soft.  Musculoskeletal:        General: Normal range of motion.     Cervical back: Normal range of motion and neck supple.  Skin:    General: Skin is warm and dry.  Neurological:     General: No focal deficit present.  Mild upper extremity tremors    Mental Status: Lethargic but arouses easily Psychiatric:        Mood and Affect: Mood is anxious.    Data Reviewed: Labs reviewed.  Potassium 3.4 Labs reviewed  Family Communication: Plan of care was discussed with patient's mother over the phone  (contact #[phone]) all questions and concerns have been addressed.  She verbalizes understanding and agrees with the plan  Disposition: Status is: Inpatient Remains inpatient appropriate because: Continues to require phenobarbital  for alcohol withdrawal symptoms  Planned Discharge Destination: TBD    Time spent: 50 minutes  Author: Aimee Somerset, MD 07/29/2024 1:16 PM  For on call review [email or web address]  "

## 2024-07-29 NOTE — Plan of Care (Signed)

## 2024-07-29 NOTE — Plan of Care (Signed)
  Problem: Clinical Measurements: Goal: Ability to maintain clinical measurements within normal limits will improve Outcome: Progressing Goal: Will remain free from infection Outcome: Progressing Goal: Diagnostic test results will improve Outcome: Progressing Goal: Respiratory complications will improve Outcome: Progressing Goal: Cardiovascular complication will be avoided Outcome: Progressing   Problem: Activity: Goal: Risk for activity intolerance will decrease Outcome: Progressing   Problem: Safety: Goal: Ability to remain free from injury will improve Outcome: Progressing   

## 2024-07-30 DIAGNOSIS — J441 Chronic obstructive pulmonary disease with (acute) exacerbation: Secondary | ICD-10-CM | POA: Diagnosis not present

## 2024-07-30 LAB — BASIC METABOLIC PANEL WITH GFR
Anion gap: 9 (ref 5–15)
BUN: 18 mg/dL (ref 6–20)
CO2: 29 mmol/L (ref 22–32)
Calcium: 9 mg/dL (ref 8.9–10.3)
Chloride: 103 mmol/L (ref 98–111)
Creatinine, Ser: 0.78 mg/dL (ref 0.61–1.24)
GFR, Estimated: >60 mL/min
Glucose, Bld: 79 mg/dL (ref 70–99)
Potassium: 3.5 mmol/L (ref 3.5–5.1)
Sodium: 141 mmol/L (ref 135–145)

## 2024-07-30 LAB — CBC
HCT: 44.3 % (ref 39.0–52.0)
Hemoglobin: 15.4 g/dL (ref 13.0–17.0)
MCH: 35.9 pg — ABNORMAL HIGH (ref 26.0–34.0)
MCHC: 34.8 g/dL (ref 30.0–36.0)
MCV: 103.3 fL — ABNORMAL HIGH (ref 80.0–100.0)
Platelets: 123 K/uL — ABNORMAL LOW (ref 150–400)
RBC: 4.29 MIL/uL (ref 4.22–5.81)
RDW: 12.1 % (ref 11.5–15.5)
WBC: 8.9 K/uL (ref 4.0–10.5)
nRBC: 0 % (ref 0.0–0.2)

## 2024-07-30 LAB — MAGNESIUM: Magnesium: 2.2 mg/dL (ref 1.7–2.4)

## 2024-07-30 NOTE — Plan of Care (Signed)
  Problem: Clinical Measurements: Goal: Ability to maintain clinical measurements within normal limits will improve Outcome: Progressing Goal: Will remain free from infection Outcome: Progressing Goal: Diagnostic test results will improve Outcome: Progressing Goal: Respiratory complications will improve Outcome: Progressing Goal: Cardiovascular complication will be avoided Outcome: Progressing   Problem: Safety: Goal: Ability to remain free from injury will improve Outcome: Progressing   Problem: Pain Managment: Goal: General experience of comfort will improve and/or be controlled Outcome: Progressing

## 2024-07-30 NOTE — Progress Notes (Signed)
 " Progress Note   Patient: George Arellano FMW:[id number] DOB: 05-29-1966 DOA: 07/25/2024     2 DOS: the patient was seen and examined on 07/30/2024   Brief hospital course:  George Arellano is a 59 y.o. male with medical history significant for COPD with chronic respiratory failure on 3 L of oxygen at night, history of nicotine  dependence, seizure disorder, history of depression, A-fib, anxiety who presents to the emergency by EMS for evaluation of shortness of breath that woke him up out of his sleep. Patient notes that he was in his usual state of health until the early hours of this morning when he woke up and was unable to breathe.  He used his rescue inhaler and inhaled steroid without any improvement in his symptoms and so he called EMS. Shortness of breath is associated with chest pain over the anterior chest wall, diaphoresis and palpitations.  He denies having any cough, no nausea, no vomiting, no abdominal pain, no changes in his bowel habits, no fever, no chills, no abdominal pain, no urinary symptoms, no changes in his bowel habits, no blood pressure no focal deficit. EMS administered 3 DuoNeb, albuterol  and 125 mg of Solu-Medrol  IV. Abnormal labs include uptrending troponin levels 38 >> 72 Chest x-ray reviewed by me shows mildly increased peripheral septal markings in the lower lung zones, most suggestive of mild interstitial pulmonary edema. CT angiogram of the chest showed no evidence of pulmonary embolism to the level of the distal segmental pulmonary arteries bilaterally; evaluation of subsegmental branches, especially in the left lower lobe, is limited by motion. Diffuse bronchial wall thickening throughout both lungs with centrilobular and paraseptal emphysema. No superimposed pneumonia, pleural effusion, or pulmonary edema. Twelve-lead EKG reviewed by me showed sinus tachycardia Despite several bronchodilator treatments and systemic steroids, patient remained short of breath with diffuse  wheezing. Patient will be referred to observation status for further evaluation     01/17:events of last night noted, patient started on phenobarbital  taper for alcohol withdrawal due to poor response to benzodiazepines.  Safety sitter at the bedside   01/18: Oriented to person but not to place or time.  Continues to require recruitment consultant.  Remains on the phenobarbital  taper  01/19: More awake and alert this morning.  Continues to require a recruitment consultant.  Remains on a phenobarbital  taper          Assessment and Plan:  Delirium tremens History of alcohol abuse Patient with a history of alcohol abuse with complications of delirium tremens Improving and is more awake and alert this morning and able to follow commands Continue phenobarbital  taper        COPD with acute exacerbation (HCC) Improved Patient with a history of COPD with chronic respiratory failure on 3 L of oxygen Patient is supposed to be on 3 L of oxygen continuous but only uses it at night He presented for evaluation of worsening shortness of breath from his baseline associated with diffuse wheezes and rhonchi. Patient has completed a course of prednisone  Continue inhaled steroids Continue scheduled and as needed bronchodilator therapy Continue oxygen supplementation to maintain pulse oximetry greater than 92%     Elevated troponin I level Most likely secondary to demand ischemia from respiratory distress related to acute COPD exacerbation Troponin levels show a downward trend Patient had a recent 2D echocardiogram which was done on 04/05/25 and showed normal LV function He also had a [employer] which showed a small fixed apical wall defect with mild apical scar without  significant ischemia Continue aspirin  and statins     Nicotine  dependence Smoking cessation was discussed with patient in detail He declines a nicotine  transdermal patch at this time     Arrhythmia (s/p Linq implantation in 2019  (battery now expired) History of arrhythmia status post Linq implantation Recent 72-hour Holter monitor which did not show any evidence of arrhythmias     HTN (hypertension) Blood pressure was elevated upon arrival and most likely related to respiratory distress and anxiety At baseline patient, patient has relative hypotension and is prescribed midodrine  for systolic blood pressure less than 110 Monitor blood pressure closely     Nonintractable epilepsy without status epilepticus (HCC) Continue seizure precautions Continue Lamictal      Hemiparesis affecting nondominant side as late effect of stroke (HCC) History of prior CVA Continue aspirin  and statins         Hypophosphatemia Supplement phosphorus       Thrombocytopenia Secondary to alcohol use Monitor closely Evidence of bleeding       Subjective: More awake and alert this morning.  Physical Exam: Vitals:   07/29/24 1653 07/29/24 1952 07/30/24 0335 07/30/24 0919  BP:  106/75 (!) 142/80 (!) 144/69  Pulse:  81 75 81  Resp: 20 18 18 15   Temp:  99 F (37.2 C) 98.4 F (36.9 C) 98.2 F (36.8 C)  TempSrc:      SpO2: 98% 97% 96% 96%  Weight:      Height:       Comments: Chronically ill-appearing  HENT:     Head: Normocephalic and atraumatic.  Eyes:     Pupils: Pupils are equal, round, and reactive to light.  Cardiovascular:     Rate and Rhythm: RRR S1,S2  Pulmonary:     Effort: Within normal limits    Breath sounds: Scattered wheezes and rhonchi Abdominal:     General: Bowel sounds are normal.     Palpations: Abdomen is soft.  Musculoskeletal:        General: Normal range of motion.     Cervical back: Normal range of motion and neck supple.  Skin:    General: Skin is warm and dry.  Neurological:     General: No focal deficit present.  Mild upper extremity tremors    Mental Status: He is oriented only to person and place Psychiatric:        Mood and Affect: Mood is anxious.      Data  Reviewed:  Labs reviewed  Family Communication: None  Disposition: Status is: Inpatient Remains inpatient appropriate because: Discharge planning.  PT evaluation  Planned Discharge Destination: TBD    Time spent: 40 minutes  Author: Aimee Somerset, MD 07/30/2024 12:01 PM  For on call review [email or web address]  "

## 2024-07-31 ENCOUNTER — Inpatient Hospital Stay

## 2024-07-31 ENCOUNTER — Other Ambulatory Visit: Payer: Self-pay

## 2024-07-31 MED ORDER — PHENOBARBITAL 32.4 MG PO TABS: 64.8000 mg | ORAL_TABLET | Freq: Three times a day (TID) | ORAL | Status: DC

## 2024-07-31 MED ORDER — PHENOBARBITAL 32.4 MG PO TABS: 32.4000 mg | ORAL_TABLET | Freq: Three times a day (TID) | ORAL | Status: DC

## 2024-07-31 MED ORDER — FOLIC ACID 1 MG PO TABS
1.0000 mg | ORAL_TABLET | Freq: Every day | ORAL | 0 refills | Status: AC
  Filled 2024-07-31: qty 30, 30d supply, fill #0

## 2024-07-31 MED ORDER — VITAMIN B-1 100 MG PO TABS
100.0000 mg | ORAL_TABLET | Freq: Every day | ORAL | 0 refills | Status: AC
  Filled 2024-07-31: qty 30, 30d supply, fill #0

## 2024-07-31 NOTE — Evaluation (Signed)
 Physical Therapy Evaluation Patient Details Name: George Arellano MRN: [id number] DOB: [birth year] Today's Date: 07/31/2024  History of Present Illness  Pt is a 59 y.o. male with medical history significant for hemiparesis affecting nondominant side as late effect of stroke, COPD with chronic respiratory failure on 3 L of oxygen at night, history of nicotine  dependence, seizure disorder, history of depression, A-fib, anxiety who presents to the emergency by EMS for evaluation of shortness of breath.  MD assessment includes: delirium tremens, history of alcohol abuse, COPD with acute exacerbation, elevated troponin, nonintractable epilepsy without status epilepticus, and thrombocytopenia.   Clinical Impression  Pt was pleasant and motivated to participate during the session and put forth good effort throughout. Pt required no physical assistance during the session with only min cuing for general sequencing with functional tasks.  Pt performed bed mobility tasks with good speed and control and was able to come to standing quickly without UE assist.  Upon coming to initial stand pt presented with mild posterior bias that he was able to self-correct with ankle strategy.  Pt was able to amb 125' with a RW with mild ataxia in his BLEs and mild drifting left/right but required no assistance for stability with SpO2 and HR WNL on 3L.  Upon returning to his room after ambulating pt began to cry and stated that it was because he felt like he had had another CVA due to his perceived difficulty coordinating his steps during ambulation.  Pt stated that although he reported an extensive fall history that this felt different to him than his baseline impairments with MD notified.  Pt will benefit from continued PT services upon discharge to safely address deficits listed in patient problem list for decreased caregiver assistance and eventual return to PLOF.           If plan is discharge home, recommend the following: A  little help with walking and/or transfers;Assistance with cooking/housework;Help with stairs or ramp for entrance;Assist for transportation   Can travel by private vehicle        Equipment Recommendations None recommended by PT  Recommendations for Other Services       Functional Status Assessment Patient has had a recent decline in their functional status and demonstrates the ability to make significant improvements in function in a reasonable and predictable amount of time.     Precautions / Restrictions Precautions Precautions: Fall Restrictions Weight Bearing Restrictions Per Provider Order: No      Mobility  Bed Mobility Overal bed mobility: Independent             General bed mobility comments: Good speed and control with bed mobility tasks    Transfers Overall transfer level: Needs assistance Equipment used: Rolling walker (2 wheels) Transfers: Sit to/from Stand Sit to Stand: Supervision           General transfer comment: Pt able to come to standing without the need of UE assist with good eccentric and concentric control; mild posterior instability upon initial stand that the pt was able to self correct with ankle strategy    Ambulation/Gait Ambulation/Gait assistance: Contact guard assist Gait Distance (Feet): 125 Feet Assistive device: Rolling walker (2 wheels) Gait Pattern/deviations: Step-through pattern, Decreased step length - right, Decreased step length - left, Trunk flexed, Drifts right/left Gait velocity: decreased     General Gait Details: Pt able to amb 125 feet with good cadence and BLE step length with mild bilateral ataxia and drifting left/right but no overt LOB or  assistance needed for stability  Stairs            Wheelchair Mobility     Tilt Bed    Modified Rankin (Stroke Patients Only)       Balance Overall balance assessment: Needs assistance   Sitting balance-Leahy Scale: Normal     Standing balance support:  Bilateral upper extremity supported, During functional activity Standing balance-Leahy Scale: Good                               Pertinent Vitals/Pain Pain Assessment Pain Assessment: No/denies pain    Home Living Family/patient expects to be discharged to:: Private residence Living Arrangements: Parent Available Help at Discharge: Family;Available 24 hours/day Type of Home: Mobile home Home Access: Ramped entrance       Home Layout: One level Home Equipment: BSC/3in1;Rolling Walker (2 wheels);Shower seat;Cane - quad      Prior Function Prior Level of Function : History of Falls (last six months)             Mobility Comments: Mod Ind amb with occasional use of QC, 10+ falls in the last 6 months due to LOB ADLs Comments: Ind with ADLs     Extremity/Trunk Assessment   Upper Extremity Assessment Upper Extremity Assessment: Overall WFL for tasks assessed    Lower Extremity Assessment Lower Extremity Assessment: Generalized weakness       Communication   Communication Communication: No apparent difficulties    Cognition Arousal: Alert Behavior During Therapy: WFL for tasks assessed/performed   PT - Cognitive impairments: No apparent impairments                         Following commands: Intact       Cueing Cueing Techniques: Verbal cues     General Comments      Exercises Other Exercises Other Exercises: Anterior weight shifting activities in standing to address mild posterior bias in static standing   Assessment/Plan    PT Assessment Patient needs continued PT services  PT Problem List Decreased strength;Decreased activity tolerance;Decreased balance;Decreased mobility;Decreased coordination;Decreased knowledge of use of DME       PT Treatment Interventions DME instruction;Gait training;Functional mobility training;Therapeutic activities;Therapeutic exercise;Balance training;Neuromuscular re-education;Patient/family  education    PT Goals (Current goals can be found in the Care Plan section)  Acute Rehab PT Goals Patient Stated Goal: To get stronger and steadier PT Goal Formulation: With patient Time For Goal Achievement: 08/13/24 Potential to Achieve Goals: Good    Frequency Min 2X/week     Co-evaluation               AM-PAC PT 6 Clicks Mobility  Outcome Measure Help needed turning from your back to your side while in a flat bed without using bedrails?: None Help needed moving from lying on your back to sitting on the side of a flat bed without using bedrails?: None Help needed moving to and from a bed to a chair (including a wheelchair)?: A Little Help needed standing up from a chair using your arms (e.g., wheelchair or bedside chair)?: A Little Help needed to walk in hospital room?: A Little Help needed climbing 3-5 steps with a railing? : A Little 6 Click Score: 20    End of Session Equipment Utilized During Treatment: Gait belt Activity Tolerance: Patient tolerated treatment well Patient left: in chair;with call bell/phone within reach;with nursing/sitter in room;with chair alarm  set psychiatrist present; chair alarm present but no box to plug it into, sitter aware) Nurse Communication: Mobility status;Other (comment) (MD notified of pt's report of feeling like he has had another CVA) PT Visit Diagnosis: Unsteadiness on feet (R26.81);History of falling (Z91.81);Repeated falls (R29.6);Muscle weakness (generalized) (M62.81);Difficulty in walking, not elsewhere classified (R26.2)    Time: 9068-9040 PT Time Calculation (min) (ACUTE ONLY): 28 min   Charges:   PT Evaluation $PT Eval Moderate Complexity: 1 Mod PT Treatments $Therapeutic Activity: 8-22 mins PT General Charges $$ ACUTE PT VISIT: 1 Visit       D. Scott Oletta Buehring PT, DPT 07/31/24, 10:53 AM

## 2024-07-31 NOTE — Discharge Summary (Signed)
 " Physician Discharge Summary   Patient: George Arellano MRN: [id number] DOB: 1966/06/20  Admit date:     07/25/2024  Discharge date: 07/31/24  Discharge Physician: Nikyla Navedo   PCP: System, Provider Not In   Recommendations at discharge:   Abstain from further alcohol use Abstain from further nicotine  use Return to the ER for evaluation of worsening symptoms  Discharge Diagnoses: Principal Problem:   COPD with acute exacerbation (HCC) Active Problems:   Elevated troponin I level   Alcohol use disorder   Hemiparesis affecting nondominant side as late effect of stroke (HCC)   Nonintractable epilepsy without status epilepticus (HCC)   HTN (hypertension)   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 *  Hospital Course:  George Arellano is a 59 y.o. male with medical history significant for COPD with chronic respiratory failure on 3 L of oxygen at night, history of nicotine  dependence, seizure disorder, history of depression, A-fib, anxiety who presents to the emergency by EMS for evaluation of shortness of breath that woke him up out of his sleep. Patient notes that he was in his usual state of health until the early hours of this morning when he woke up and was unable to breathe.  He used his rescue inhaler and inhaled steroid without any improvement in his symptoms and so he called EMS. Shortness of breath is associated with chest pain over the anterior chest wall, diaphoresis and palpitations.  He denies having any cough, no nausea, no vomiting, no abdominal pain, no changes in his bowel habits, no fever, no chills, no abdominal pain, no urinary symptoms, no changes in his bowel habits, no blood pressure no focal deficit. EMS administered 3 DuoNeb, albuterol  and 125 mg of Solu-Medrol  IV. Abnormal labs include uptrending troponin levels 38 >> 72 Chest x-ray  reviewed by me shows mildly increased peripheral septal markings in the lower lung zones, most suggestive of mild interstitial pulmonary edema. CT angiogram of the chest showed no evidence of pulmonary embolism to the level of the distal segmental pulmonary arteries bilaterally; evaluation of subsegmental branches, especially in the left lower lobe, is limited by motion. Diffuse bronchial wall thickening throughout both lungs with centrilobular and paraseptal emphysema. No superimposed pneumonia, pleural effusion, or pulmonary edema. Twelve-lead EKG reviewed by me showed sinus tachycardia Despite several bronchodilator treatments and systemic steroids, patient remained short of breath with diffuse wheezing. Patient will be referred to observation status for further evaluation     01/17:events of last night noted, patient started on phenobarbital  taper for alcohol withdrawal due to poor response to benzodiazepines.  Safety sitter at the bedside   01/18: Oriented to person but not to place or time.  Continues to require recruitment consultant.  Remains on the phenobarbital  taper   01/19: More awake and alert this morning.  Continues to require a recruitment consultant.  Remains on a phenobarbital  taper  01/20: Back to his baseline mental status.  Awake, alert and oriented to person and place.  Vital signs stable        Assessment and Plan:  Delirium tremens (Resolved) History of alcohol abuse Patient with a history of alcohol abuse with complications of delirium tremens Patient symptoms have resolved and he is back to his baseline mental status He was on a phenobarbital  taper that he would complete at home on discharge  COPD with acute exacerbation (HCC) Improved Patient with a history of COPD with chronic respiratory failure on 3 L of oxygen Patient is supposed to be on 3 L of oxygen continuous but only uses it at night He presented for evaluation of worsening shortness of breath from his  baseline associated with diffuse wheezes and rhonchi. Patient has completed a course of prednisone  with marked improvement in his symptoms Continue inhaled steroids Continue as needed bronchodilator therapy Continue oxygen supplementation to maintain pulse oximetry greater than 92%     Elevated troponin I level Most likely secondary to demand ischemia from respiratory distress related to acute COPD exacerbation Troponin levels show a downward trend Patient had a recent 2D echocardiogram which was done on 04/05/25 and showed normal LV function He also had a [employer] which showed a small fixed apical wall defect with mild apical scar without significant ischemia Continue aspirin  and statins     Nicotine  dependence Smoking cessation was discussed with patient in detail He declines a nicotine  transdermal patch at this time     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) History of arrhythmia status post Linq implantation Recent 72-hour Holter monitor which did not show any evidence of arrhythmias     HTN (hypertension) Blood pressure was elevated upon arrival and most likely related to respiratory distress and anxiety At baseline patient, patient has relative hypotension and is prescribed midodrine  for systolic blood pressure less than 110 Monitor blood pressure closely Follow-up with cardiology as an outpatient     Nonintractable epilepsy without status epilepticus (HCC) Continue seizure precautions Continue Lamictal      Hemiparesis affecting nondominant side as late effect of stroke (HCC) History of prior CVA Continue aspirin  and statins         Hypophosphatemia Supplement phosphorus       Thrombocytopenia Secondary to alcohol use Monitor closely Evidence of bleeding           Consultants: None Procedures performed: None Disposition: Home health Diet recommendation:  Cardiac diet DISCHARGE MEDICATION: Allergies as of  07/31/2024       Reactions   Atorvastatin  Hives, Itching   All over trunk of body   Levaquin [levofloxacin] Hives, Other (See Comments)   Rash, itching all over trunk of body        Medication List     STOP taking these medications    cyanocobalamin  1000 MCG tablet Commonly known as: VITAMIN B12   hydrochlorothiazide  12.5 MG tablet Commonly known as: HYDRODIURIL    losartan  25 MG tablet Commonly known as: COZAAR    metoprolol  tartrate 50 MG tablet Commonly known as: LOPRESSOR    midodrine  2.5 MG tablet Commonly known as: PROAMATINE    predniSONE  10 MG tablet Commonly known as: DELTASONE        TAKE these medications    albuterol  108 (90 Base) MCG/ACT inhaler Commonly known as: VENTOLIN  HFA Inhale 2 puffs into the lungs every 6 (six) hours as needed for wheezing or shortness of breath.   aspirin  EC 81 MG tablet Take 81 mg by mouth daily. Swallow whole.   budesonide -formoterol  160-4.5 MCG/ACT inhaler Commonly known as: SYMBICORT  Inhale 2 puffs into the lungs 2 (two) times daily.   busPIRone  10 MG tablet Commonly known as: BUSPAR  Take 10 mg by mouth 3 (three) times daily.   Combivent Respimat 20-100 MCG/ACT Aers respimat Generic drug: Ipratropium-Albuterol  Inhale 2 puffs into the lungs in the morning and at bedtime.   ipratropium-albuterol  0.5-2.5 (3) MG/3ML Soln Commonly known as: DUONEB  Take 3 mLs by nebulization every 6 (six) hours as needed (for shortness of breath/wheezing).   diphenhydrAMINE  25 MG tablet Commonly known as: BENADRYL  Take 25 mg by mouth daily.   ezetimibe 10 MG tablet Commonly known as: ZETIA Take 10 mg by mouth daily.   folic acid  1 MG tablet Commonly known as: FOLVITE  Take 1 tablet (1 mg total) by mouth daily. Start taking on: August 01, 2024   furosemide  20 MG tablet Commonly known as: LASIX  Take 20 mg by mouth daily.   gabapentin  300 MG capsule Commonly known as: NEURONTIN  Take 300 mg by mouth 2 (two) times daily.    lamoTRIgine  150 MG tablet Commonly known as: LAMICTAL  Take 150 mg by mouth daily. In the morning   lamoTRIgine  200 MG tablet Commonly known as: LAMICTAL  Take 200 mg by mouth at bedtime. In the evening.   multivitamin with minerals Tabs tablet Take 1 tablet by mouth daily.   potassium chloride  SA 20 MEQ tablet Commonly known as: KLOR-CON  M Take 10 mEq by mouth 2 (two) times daily.   thiamine  100 MG tablet Commonly known as: Vitamin B-1 Take 1 tablet (100 mg total) by mouth daily. Start taking on: August 01, 2024   Vitamin D  (Ergocalciferol ) 1.25 MG (50000 UNIT) Caps capsule Commonly known as: DRISDOL  Take 50,000 Units by mouth once a week.        Follow-up Information     Theotis Lavelle BRAVO, MD Follow up in 1 week(s).   Specialty: Specialist Contact information: [address] [place] [address] [phone]                Discharge Exam: Filed Weights   07/25/24 0804 07/25/24 2220  Weight: 86 kg 87.3 kg   Comments: Chronically ill-appearing  HENT:     Head: Normocephalic and atraumatic.  Eyes:     Pupils: Pupils are equal, round, and reactive to light.  Cardiovascular:     Rate and Rhythm: RRR S1,S2  Pulmonary:     Effort: Within normal limits    Breath sounds: Scattered wheezes and rhonchi Abdominal:     General: Bowel sounds are normal.     Palpations: Abdomen is soft.  Musculoskeletal:        General: Normal range of motion.     Cervical back: Normal range of motion and neck supple.  Skin:    General: Skin is warm and dry.  Neurological:     General: No focal deficit present.  Mild upper extremity tremors    Mental Status: He is oriented only to person and place Psychiatric:        Mood and Affect:  mood is within normal limits.     Condition at discharge: stable  The results of significant diagnostics from this hospitalization (including imaging, microbiology, ancillary and laboratory) are listed below for reference.    Imaging Studies: MR BRAIN WO CONTRAST Result Date: 07/31/2024 EXAM: MRI BRAIN WITHOUT CONTRAST 07/31/2024 11:49:07 AM TECHNIQUE: Multiplanar multisequence MRI of the head/brain was performed without the administration of intravenous contrast. COMPARISON: MRI Head without IV contrast 03/03/2018, head CT 08/08/2024. CLINICAL HISTORY: 59 year old male hospitalized with shortness of breath. Neuro deficit, acute, stroke suspected. FINDINGS: BRAIN AND VENTRICLES: No acute infarct. No intracranial hemorrhage. No mass. No midline shift. No hydrocephalus. The sella is unremarkable. Abnormal flow void of the distal left vertebral artery appears to be chronic. Stable other major vascular flow voids. Large chronic infarct with encephalomalacia in the right PCA territory. Scattered bilateral  cerebellar artery infarcts with encephalomalacia. These findings largely chronic, only mildly progressed since 2019. Associated ex vacuo enlargement of the right temporal and occipital horns. Mild hemosiderin associated with the right PCA infarct. No other chronic cerebral blood products identified. Mild for age additional scattered cerebral white matter T2 and FLAIR hyperintensity in a nonspecific configuration. Brainstem remains within normal limits. Intrinsic increased T1 signal in the globus pallidus, most apparent on sagittal T1 imaging. Hepatic insufficiency, cirrhosis, portosystemic shunt, portal vein thrombosis, total parenteral nutrition, and is less often seen with hyperglycemia or other metabolic disturbances such as Wilson's disease. Correlation with serum ammonia levels and liver function tests is recommended. Negative visible cervical spine. ORBITS: No significant abnormality. SINUSES AND MASTOIDS: Minor paranasal sinus and right mastoid opacification. Grossly normal visible internal auditory structures. BONES AND SOFT TISSUES: Normal marrow signal. No soft tissue abnormality. IMPRESSION: 1. Intrinsic increased T1 signal  in the globus pallidus, most commonly related to hepatic insufficiency / cirrhosis. Correlation with serum ammonia levels and liver function tests may be valuable. 2. No superimposed acute intracranial abnormality. 3. Advanced chronic right PCA and bilateral cerebellar territory infarcts with encephalomalacia. Electronically signed by: Helayne Hurst MD 07/31/2024 01:37 PM EST RP Workstation: [id number]   CT HEAD WO CONTRAST ( ) Result Date: 07/29/2024 EXAM: CT HEAD WITHOUT CONTRAST 07/29/2024 01:55:00 PM TECHNIQUE: CT of the head was performed without the administration of intravenous contrast. Automated exposure control, iterative reconstruction, and/or weight based adjustment of the mA/kV was utilized to reduce the radiation dose to as low as reasonably achievable. COMPARISON: CT head without contrast 06/19/2019. CLINICAL HISTORY: Neuro deficit, acute, stroke suspected Neuro deficit, acute, stroke suspected Neuro deficit, acute, stroke suspected Neuro deficit, acute, stroke suspected Neuro deficit, acute, stroke suspected FINDINGS: BRAIN AND VENTRICLES: Chronic right PCA territory infarct with associated ex vacuo dilatation of right lateral ventricle. Chronic bilateral cerebellar infarcts are stable. Atherosclerotic calcifications are present in the cavernous carotid arteries bilaterally. No hyperdense vessel is present. No acute hemorrhage. No evidence of acute infarct. No hydrocephalus. No extra-axial collection. No mass effect or midline shift. ORBITS: Bilateral lens replacement. SINUSES: No acute abnormality. SOFT TISSUES AND SKULL: No acute soft tissue abnormality. No skull fracture. IMPRESSION: 1. No acute intracranial abnormality. 2. Chronic right PCA territory infarct with associated ex vacuo dilatation of the right lateral ventricle. 3. Stable chronic bilateral cerebellar infarcts. Electronically signed by: Lonni Necessary MD 07/29/2024 02:41 PM EST RP Workstation: [id number]   CT Angio Chest PE  W and/or Wo Contrast Result Date: 07/25/2024 EXAM: CTA CHEST 07/25/2024 10:30:34 AM TECHNIQUE: CTA of the chest was performed without and with the administration of 75 mL of iohexol  (OMNIPAQUE ) 350 MG/ML intravenous contrast. Multiplanar reformatted images are provided for review. MIP images are provided for review. Automated exposure control, iterative reconstruction, and/or weight based adjustment of the mA/kV was utilized to reduce the radiation dose to as low as reasonably achievable. COMPARISON: 10/28/2022 CLINICAL HISTORY: Pulmonary embolism (PE) suspected, high prob. FINDINGS: PULMONARY ARTERIES: No pulmonary embolism to the level of the distal segmental pulmonary arteries bilaterally. The subsegmental branches, especially in the left lower lobe, are degraded by motion. Main pulmonary artery is normal in caliber. MEDIASTINUM: The heart and pericardium demonstrate no acute abnormality. Multivessel coronary atherosclerosis. Cardiac loop recorder device in the anterior left chest. There is no acute abnormality of the thoracic aorta. Diffuse aortoiliac atherosclerosis. 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 Distal reconstitution is present in the mid left  subclavian artery . LYMPH NODES: Mildly enlarged subcarinal lymph node measuring 1.1 cm. No axillary lymphadenopathy. LUNGS AND PLEURA: Diffuse bronchial wall thickening throughout both lungs. Mild centrilobular and paraseptal emphysema. Posterior bibasilar dependent atelectasis. No focal consolidation or pulmonary edema. No evidence of pleural effusion or pneumothorax. A 4 mm nodule associated with the lateral horizontal fissure, likely an intrafissural lymph node. UPPER ABDOMEN: Cirrhotic liver. Cholecystectomy. SOFT TISSUES AND BONES: Mild multilevel degenerative disc disease of the spine. No acute bone or soft tissue abnormality. IMPRESSION: 1. No pulmonary embolism to the level of  the distal segmental pulmonary arteries bilaterally; evaluation of subsegmental branches, especially in the left lower lobe, is limited by motion. 2. Diffuse bronchial wall thickening throughout both lungs with centrilobular and paraseptal emphysema. No superimposed pneumonia, pleural effusion, or pulmonary edema. Electronically signed by: Rogelia Myers MD 07/25/2024 11:40 AM EST RP Workstation: [id number]   DG Chest 2 View Result Date: 07/25/2024 EXAM: 2 VIEW(S) XRAY OF THE CHEST 07/25/2024 08:27:00 AM COMPARISON: 07/25/23. CLINICAL HISTORY: The patient reports shortness of breath. FINDINGS: LINES, TUBES AND DEVICES: Loop recorder in left chest. LUNGS AND PLEURA: Mildly increased peripheral septal markings within the lower lung zones. No focal pulmonary opacity. No pleural effusion. No pneumothorax. HEART AND MEDIASTINUM: Calcified aorta. BONES AND SOFT TISSUES: No acute osseous abnormality. IMPRESSION: 1. Mildly increased peripheral septal markings in the lower lung zones, most suggestive of mild interstitial pulmonary edema. Electronically signed by: Waddell Calk MD 07/25/2024 08:47 AM EST RP Workstation: [id number]    Microbiology: Results for orders placed or performed during the hospital encounter of 07/25/24  Resp panel by RT-PCR (RSV, Flu A&B, Covid) Anterior Nasal Swab     Status: None   Collection Time: 07/25/24 10:05 AM   Specimen: Anterior Nasal Swab  Result Value Ref Range Status   SARS Coronavirus 2 by RT PCR NEGATIVE NEGATIVE Final    Comment: (NOTE) SARS-CoV-2 target nucleic acids are NOT DETECTED.  The SARS-CoV-2 RNA is generally detectable in upper respiratory specimens during the acute phase of infection. The lowest concentration of SARS-CoV-2 viral copies this assay can detect is 138 copies/mL. A negative result does not preclude SARS-Cov-2 infection and should not be used as the sole basis for treatment or other patient management decisions. A negative result may occur with   improper specimen collection/handling, submission of specimen other than nasopharyngeal swab, presence of viral mutation(s) within the areas targeted by this assay, and inadequate number of viral copies(<138 copies/mL). A negative result must be combined with clinical observations, patient history, and epidemiological information. The expected result is Negative.  Fact Sheet for Patients:  bloggercourse.com  Fact Sheet for Healthcare Providers:  seriousbroker.it  This test is no t yet approved or cleared by the [place]  FDA and  has been authorized for detection and/or diagnosis of SARS-CoV-2 by FDA under an Emergency Use Authorization (EUA). This EUA will remain  in effect (meaning this test can be used) for the duration of the COVID-19 declaration under Section 564(b)(1) of the Act, 21 U.S.C.section 360bbb-3(b)(1), unless the authorization is terminated  or revoked sooner.       Influenza A by PCR NEGATIVE NEGATIVE Final   Influenza B by PCR NEGATIVE NEGATIVE Final    Comment: (NOTE) The Xpert Xpress SARS-CoV-2/FLU/RSV plus assay is intended as an aid in the diagnosis of influenza from Nasopharyngeal swab specimens and should not be used as a sole basis for treatment. Nasal washings and aspirates are unacceptable for Xpert Xpress SARS-CoV-2/FLU/RSV testing.  Fact Sheet for Patients: bloggercourse.com  Fact Sheet for Healthcare Providers: seriousbroker.it  This test is not yet approved or cleared by the [place]  FDA and has been authorized for detection and/or diagnosis of SARS-CoV-2 by FDA under an Emergency Use Authorization (EUA). This EUA will remain in effect (meaning this test can be used) for the duration of the COVID-19 declaration under Section 564(b)(1) of the Act, 21 U.S.C. section 360bbb-3(b)(1), unless the authorization is terminated or revoked.      Resp Syncytial Virus by PCR NEGATIVE NEGATIVE Final    Comment: (NOTE) Fact Sheet for Patients: bloggercourse.com  Fact Sheet for Healthcare Providers: seriousbroker.it  This test is not yet approved or cleared by the [place]  FDA and has been authorized for detection and/or diagnosis of SARS-CoV-2 by FDA under an Emergency Use Authorization (EUA). This EUA will remain in effect (meaning this test can be used) for the duration of the COVID-19 declaration under Section 564(b)(1) of the Act, 21 U.S.C. section 360bbb-3(b)(1), unless the authorization is terminated or revoked.  Performed at [facility], [address] [place], [place] [address]   Respiratory (~20 pathogens) panel by PCR     Status: None   Collection Time: 07/26/24  3:35 AM   Specimen: Nasopharyngeal Swab; Respiratory  Result Value Ref Range Status   Adenovirus NOT DETECTED NOT DETECTED Final   Coronavirus 229E NOT DETECTED NOT DETECTED Final    Comment: (NOTE) The Coronavirus on the Respiratory Panel, DOES NOT test for the novel  Coronavirus (2019 nCoV)    Coronavirus HKU1 NOT DETECTED NOT DETECTED Final   Coronavirus NL63 NOT DETECTED NOT DETECTED Final   Coronavirus OC43 NOT DETECTED NOT DETECTED Final   Metapneumovirus NOT DETECTED NOT DETECTED Final   Rhinovirus / Enterovirus NOT DETECTED NOT DETECTED Final   Influenza A NOT DETECTED NOT DETECTED Final   Influenza B NOT DETECTED NOT DETECTED Final   Parainfluenza Virus 1 NOT DETECTED NOT DETECTED Final   Parainfluenza Virus 2 NOT DETECTED NOT DETECTED Final   Parainfluenza Virus 3 NOT DETECTED NOT DETECTED Final   Parainfluenza Virus 4 NOT DETECTED NOT DETECTED Final   Respiratory Syncytial Virus NOT DETECTED NOT DETECTED Final   Bordetella pertussis NOT DETECTED NOT DETECTED Final   Bordetella Parapertussis NOT DETECTED NOT DETECTED Final   Chlamydophila pneumoniae NOT DETECTED NOT  DETECTED Final   Mycoplasma pneumoniae NOT DETECTED NOT DETECTED Final    Comment: Performed at [facility], [address] [place], [place] [address]  Expectorated Sputum Assessment w Gram Stain, Rflx to Resp Cult     Status: None   Collection Time: 07/26/24  4:00 AM   Specimen: Expectorated Sputum  Result Value Ref Range Status   Specimen Description EXPECTORATED SPUTUM  Final   Special Requests NONE  Final   Sputum evaluation   Final    Sputum specimen not acceptable for testing.  Please recollect.   NOTIFIED DAWN LYNA MIRACLE 07/26/24 HNM Performed at [facility], [address] [place], [place] [address]    Report Status 07/26/2024 FINAL  Final    Labs: CBC: Recent Labs  Lab 07/25/24 0805 07/26/24 0522 07/28/24 0446 07/29/24 0928 07/30/24 0806  WBC 6.3 11.5* 7.6 8.3 8.9  NEUTROABS 4.2  --   --   --   --   HGB 16.6 14.9 15.2 15.8 15.4  HCT 48.8 44.4 44.2 46.1 44.3  MCV 106.1* 106.5* 105.5* 106.2* 103.3*  PLT 104* 113* 123* 114* 123*  Basic Metabolic Panel: Recent Labs  Lab 07/25/24 0805 07/25/24 2049 07/26/24 0522 07/28/24 0446 07/28/24 0538 07/29/24 0928 07/30/24 0806  NA 144  --  138 139  --  141 141  K 3.5  --  4.1 4.0  --  3.4* 3.5  CL 103  --  102 102  --  100 103  CO2 27  --  28 24  --  34* 29  GLUCOSE 115*  --  135* 74  --  93 79  BUN 8  --  14 19  --  18 18  CREATININE 0.78  --  0.80 0.92  --  0.82 0.78  CALCIUM  9.5  --  9.2 9.2  --  9.0 9.0  MG  --  2.1  --   --  2.4 2.2 2.2  PHOS  --  1.5*  --   --   --   --   --    Liver Function Tests: No results for input(s): AST, ALT, ALKPHOS, BILITOT, PROT, ALBUMIN in the last 168 hours. CBG: No results for input(s): GLUCAP in the last 168 hours.  Discharge time spent: greater than 30 minutes.  Signed: Aimee Somerset, MD [facility] 07/31/2024 "

## 2024-07-31 NOTE — Plan of Care (Signed)
